# Patient Record
Sex: Female | Born: 1975 | Race: Black or African American | Hispanic: No | Marital: Single | Smoking: Never smoker
Health system: Southern US, Community
[De-identification: ages and names within clinical notes are randomized; demographics above are authoritative.]

## PROBLEM LIST (undated history)

## (undated) DIAGNOSIS — Z21 Asymptomatic human immunodeficiency virus [HIV] infection status: Secondary | ICD-10-CM

## (undated) DIAGNOSIS — I10 Essential (primary) hypertension: Secondary | ICD-10-CM

## (undated) DIAGNOSIS — R7303 Prediabetes: Secondary | ICD-10-CM

## (undated) DIAGNOSIS — O24419 Gestational diabetes mellitus in pregnancy, unspecified control: Secondary | ICD-10-CM

## (undated) DIAGNOSIS — B20 Human immunodeficiency virus [HIV] disease: Secondary | ICD-10-CM

## (undated) HISTORY — DX: Gestational diabetes mellitus in pregnancy, unspecified control: O24.419

## (undated) HISTORY — DX: Asymptomatic human immunodeficiency virus (hiv) infection status: Z21

## (undated) HISTORY — DX: Human immunodeficiency virus (HIV) disease: B20

## (undated) HISTORY — PX: NO PAST SURGERIES: SHX2092

---

## 2003-07-22 ENCOUNTER — Inpatient Hospital Stay (HOSPITAL_COMMUNITY): Admission: AD | Admit: 2003-07-22 | Discharge: 2003-07-22 | Payer: Self-pay | Admitting: Obstetrics & Gynecology

## 2003-07-26 ENCOUNTER — Inpatient Hospital Stay (HOSPITAL_COMMUNITY): Admission: AD | Admit: 2003-07-26 | Discharge: 2003-07-26 | Payer: Self-pay | Admitting: Obstetrics and Gynecology

## 2003-07-31 ENCOUNTER — Inpatient Hospital Stay (HOSPITAL_COMMUNITY): Admission: RE | Admit: 2003-07-31 | Discharge: 2003-07-31 | Payer: Self-pay | Admitting: Obstetrics & Gynecology

## 2003-11-22 ENCOUNTER — Encounter (INDEPENDENT_AMBULATORY_CARE_PROVIDER_SITE_OTHER): Payer: Self-pay | Admitting: *Deleted

## 2003-11-22 ENCOUNTER — Ambulatory Visit: Payer: Self-pay | Admitting: Infectious Diseases

## 2003-11-22 ENCOUNTER — Ambulatory Visit (HOSPITAL_COMMUNITY): Admission: RE | Admit: 2003-11-22 | Discharge: 2003-11-22 | Payer: Self-pay | Admitting: Infectious Diseases

## 2003-11-22 LAB — CONVERTED CEMR LAB
CD4 Count: 250 microliters
CD4 T Cell Abs: 250

## 2003-12-13 ENCOUNTER — Ambulatory Visit: Payer: Self-pay | Admitting: Infectious Diseases

## 2004-01-09 ENCOUNTER — Ambulatory Visit: Payer: Self-pay | Admitting: *Deleted

## 2004-01-17 ENCOUNTER — Ambulatory Visit: Payer: Self-pay | Admitting: *Deleted

## 2004-01-31 ENCOUNTER — Ambulatory Visit: Payer: Self-pay | Admitting: Obstetrics & Gynecology

## 2004-02-08 ENCOUNTER — Ambulatory Visit (HOSPITAL_COMMUNITY): Admission: RE | Admit: 2004-02-08 | Discharge: 2004-02-08 | Payer: Self-pay | Admitting: *Deleted

## 2004-02-22 ENCOUNTER — Ambulatory Visit: Payer: Self-pay | Admitting: Family Medicine

## 2004-02-29 ENCOUNTER — Ambulatory Visit: Payer: Self-pay | Admitting: Family Medicine

## 2004-03-02 ENCOUNTER — Inpatient Hospital Stay (HOSPITAL_COMMUNITY): Admission: AD | Admit: 2004-03-02 | Discharge: 2004-03-05 | Payer: Self-pay | Admitting: Obstetrics & Gynecology

## 2004-03-02 ENCOUNTER — Ambulatory Visit: Payer: Self-pay | Admitting: Obstetrics and Gynecology

## 2004-03-03 ENCOUNTER — Ambulatory Visit: Payer: Self-pay | Admitting: Obstetrics & Gynecology

## 2004-04-15 ENCOUNTER — Ambulatory Visit (HOSPITAL_COMMUNITY): Admission: RE | Admit: 2004-04-15 | Discharge: 2004-04-15 | Payer: Self-pay | Admitting: Infectious Diseases

## 2004-04-15 ENCOUNTER — Ambulatory Visit: Payer: Self-pay | Admitting: Infectious Diseases

## 2004-07-03 ENCOUNTER — Ambulatory Visit: Payer: Self-pay | Admitting: Infectious Diseases

## 2004-07-03 ENCOUNTER — Ambulatory Visit (HOSPITAL_COMMUNITY): Admission: RE | Admit: 2004-07-03 | Discharge: 2004-07-03 | Payer: Self-pay | Admitting: Infectious Diseases

## 2004-10-14 ENCOUNTER — Ambulatory Visit (HOSPITAL_COMMUNITY): Admission: RE | Admit: 2004-10-14 | Discharge: 2004-10-14 | Payer: Self-pay | Admitting: Infectious Diseases

## 2004-10-14 ENCOUNTER — Ambulatory Visit: Payer: Self-pay | Admitting: Infectious Diseases

## 2004-10-14 ENCOUNTER — Emergency Department (HOSPITAL_COMMUNITY): Admission: EM | Admit: 2004-10-14 | Discharge: 2004-10-14 | Payer: Self-pay | Admitting: Emergency Medicine

## 2004-12-05 ENCOUNTER — Ambulatory Visit: Payer: Self-pay | Admitting: Infectious Diseases

## 2005-03-24 ENCOUNTER — Encounter (INDEPENDENT_AMBULATORY_CARE_PROVIDER_SITE_OTHER): Payer: Self-pay | Admitting: *Deleted

## 2005-03-24 LAB — CONVERTED CEMR LAB: HIV 1 RNA Quant: 399 copies/mL

## 2005-03-26 ENCOUNTER — Ambulatory Visit: Payer: Self-pay | Admitting: Infectious Diseases

## 2005-03-26 ENCOUNTER — Encounter (INDEPENDENT_AMBULATORY_CARE_PROVIDER_SITE_OTHER): Payer: Self-pay | Admitting: *Deleted

## 2005-03-26 LAB — CONVERTED CEMR LAB: CD4 Count: 420 microliters

## 2005-04-01 ENCOUNTER — Encounter (INDEPENDENT_AMBULATORY_CARE_PROVIDER_SITE_OTHER): Payer: Self-pay | Admitting: *Deleted

## 2005-04-01 ENCOUNTER — Ambulatory Visit: Payer: Self-pay | Admitting: Family Medicine

## 2005-04-09 ENCOUNTER — Ambulatory Visit (HOSPITAL_COMMUNITY): Admission: RE | Admit: 2005-04-09 | Discharge: 2005-04-09 | Payer: Self-pay | Admitting: Obstetrics and Gynecology

## 2005-04-09 ENCOUNTER — Ambulatory Visit: Payer: Self-pay | Admitting: *Deleted

## 2005-04-28 ENCOUNTER — Ambulatory Visit: Payer: Self-pay | Admitting: Family Medicine

## 2005-05-12 ENCOUNTER — Ambulatory Visit: Payer: Self-pay | Admitting: Family Medicine

## 2005-05-12 ENCOUNTER — Ambulatory Visit: Payer: Self-pay | Admitting: Infectious Diseases

## 2005-05-26 ENCOUNTER — Ambulatory Visit: Payer: Self-pay | Admitting: *Deleted

## 2005-05-27 ENCOUNTER — Ambulatory Visit (HOSPITAL_COMMUNITY): Admission: RE | Admit: 2005-05-27 | Discharge: 2005-05-27 | Payer: Self-pay | Admitting: *Deleted

## 2005-06-09 ENCOUNTER — Ambulatory Visit: Payer: Self-pay | Admitting: Family Medicine

## 2005-06-23 ENCOUNTER — Ambulatory Visit: Payer: Self-pay | Admitting: Family Medicine

## 2005-07-03 ENCOUNTER — Ambulatory Visit: Payer: Self-pay | Admitting: Gynecology

## 2005-07-10 ENCOUNTER — Ambulatory Visit: Payer: Self-pay | Admitting: Gynecology

## 2005-07-21 ENCOUNTER — Ambulatory Visit: Payer: Self-pay | Admitting: Family Medicine

## 2005-07-21 ENCOUNTER — Ambulatory Visit: Payer: Self-pay | Admitting: Obstetrics & Gynecology

## 2005-07-21 ENCOUNTER — Ambulatory Visit: Payer: Self-pay | Admitting: Internal Medicine

## 2005-07-21 ENCOUNTER — Inpatient Hospital Stay (HOSPITAL_COMMUNITY): Admission: AD | Admit: 2005-07-21 | Discharge: 2005-07-24 | Payer: Self-pay | Admitting: Gynecology

## 2005-07-22 ENCOUNTER — Encounter (INDEPENDENT_AMBULATORY_CARE_PROVIDER_SITE_OTHER): Payer: Self-pay | Admitting: Specialist

## 2005-09-08 ENCOUNTER — Encounter (INDEPENDENT_AMBULATORY_CARE_PROVIDER_SITE_OTHER): Payer: Self-pay | Admitting: *Deleted

## 2005-09-08 ENCOUNTER — Ambulatory Visit: Payer: Self-pay | Admitting: Infectious Diseases

## 2005-09-08 ENCOUNTER — Encounter: Admission: RE | Admit: 2005-09-08 | Discharge: 2005-09-08 | Payer: Self-pay | Admitting: Infectious Diseases

## 2005-09-08 LAB — CONVERTED CEMR LAB
CD4 Count: 460 microliters
HIV 1 RNA Quant: 49 copies/mL

## 2005-09-11 ENCOUNTER — Ambulatory Visit: Payer: Self-pay | Admitting: Obstetrics & Gynecology

## 2005-09-11 ENCOUNTER — Encounter (INDEPENDENT_AMBULATORY_CARE_PROVIDER_SITE_OTHER): Payer: Self-pay | Admitting: *Deleted

## 2005-10-09 ENCOUNTER — Ambulatory Visit: Payer: Self-pay | Admitting: Obstetrics & Gynecology

## 2005-11-10 ENCOUNTER — Ambulatory Visit: Payer: Self-pay | Admitting: Infectious Diseases

## 2005-12-24 DIAGNOSIS — B20 Human immunodeficiency virus [HIV] disease: Secondary | ICD-10-CM | POA: Insufficient documentation

## 2005-12-24 DIAGNOSIS — Z8619 Personal history of other infectious and parasitic diseases: Secondary | ICD-10-CM | POA: Insufficient documentation

## 2006-01-30 ENCOUNTER — Other Ambulatory Visit: Admission: RE | Admit: 2006-01-30 | Discharge: 2006-01-30 | Payer: Self-pay | Admitting: Gynecology

## 2006-01-30 ENCOUNTER — Ambulatory Visit: Payer: Self-pay | Admitting: Gynecology

## 2006-01-30 ENCOUNTER — Encounter (INDEPENDENT_AMBULATORY_CARE_PROVIDER_SITE_OTHER): Payer: Self-pay | Admitting: *Deleted

## 2006-03-06 ENCOUNTER — Ambulatory Visit: Payer: Self-pay | Admitting: Family Medicine

## 2006-03-12 ENCOUNTER — Ambulatory Visit (HOSPITAL_COMMUNITY): Admission: RE | Admit: 2006-03-12 | Discharge: 2006-03-12 | Payer: Self-pay | Admitting: Family Medicine

## 2006-03-23 ENCOUNTER — Encounter (INDEPENDENT_AMBULATORY_CARE_PROVIDER_SITE_OTHER): Payer: Self-pay | Admitting: *Deleted

## 2006-03-23 ENCOUNTER — Encounter: Admission: RE | Admit: 2006-03-23 | Discharge: 2006-03-23 | Payer: Self-pay | Admitting: Infectious Diseases

## 2006-03-23 ENCOUNTER — Ambulatory Visit: Payer: Self-pay | Admitting: Infectious Diseases

## 2006-03-23 LAB — CONVERTED CEMR LAB
ALT: 16 units/L (ref 0–35)
AST: 14 units/L (ref 0–37)
Albumin: 3.8 g/dL (ref 3.5–5.2)
Alkaline Phosphatase: 57 units/L (ref 39–117)
BUN: 9 mg/dL (ref 6–23)
Basophils Absolute: 0 10*3/uL (ref 0.0–0.1)
Basophils Relative: 0 % (ref 0–1)
Bilirubin Urine: NEGATIVE
CD4 Count: 300 microliters
CO2: 23 meq/L (ref 19–32)
Calcium: 8.9 mg/dL (ref 8.4–10.5)
Chloride: 102 meq/L (ref 96–112)
Cholesterol: 229 mg/dL — ABNORMAL HIGH (ref 0–200)
Creatinine, Ser: 0.61 mg/dL (ref 0.40–1.20)
Eosinophils Absolute: 0.1 10*3/uL (ref 0.0–0.7)
Eosinophils Relative: 2 % (ref 0–5)
Glucose, Bld: 110 mg/dL — ABNORMAL HIGH (ref 70–99)
HCT: 40 % (ref 36.0–46.0)
HDL: 44 mg/dL (ref >39)
HIV 1 RNA Quant: 49 copies/mL
HIV 1 RNA Quant: <50 copies/mL (ref <50)
HIV-1 RNA Quant, Log: <1.7 (ref <1.70)
Hemoglobin, Urine: NEGATIVE
Hemoglobin: 12.6 g/dL (ref 12.0–15.0)
Ketones, ur: NEGATIVE mg/dL
LDL Cholesterol: 157 mg/dL — ABNORMAL HIGH (ref 0–99)
Leukocytes, UA: NEGATIVE
Lymphocytes Relative: 22 % (ref 12–46)
Lymphs Abs: 1.1 10*3/uL (ref 0.7–3.3)
MCHC: 31.5 g/dL (ref 30.0–36.0)
MCV: 96.4 fL (ref 78.0–100.0)
Monocytes Absolute: 0.6 10*3/uL (ref 0.2–0.7)
Monocytes Relative: 11 % (ref 3–11)
Neutro Abs: 3.3 10*3/uL (ref 1.7–7.7)
Neutrophils Relative %: 65 % (ref 43–77)
Nitrite: NEGATIVE
Platelets: 184 10*3/uL (ref 150–400)
Potassium: 3.9 meq/L (ref 3.5–5.3)
Protein, ur: NEGATIVE mg/dL
RBC: 4.15 M/uL (ref 3.87–5.11)
RDW: 13 % (ref 11.5–14.0)
Sodium: 139 meq/L (ref 135–145)
Specific Gravity, Urine: 1.027 (ref 1.005–1.03)
Total Bilirubin: 0.3 mg/dL (ref 0.3–1.2)
Total CHOL/HDL Ratio: 5.2
Total Protein: 7.4 g/dL (ref 6.0–8.3)
Triglycerides: 141 mg/dL (ref <150)
Urine Glucose: NEGATIVE mg/dL
Urobilinogen, UA: 1 (ref 0.0–1.0)
VLDL: 28 mg/dL (ref 0–40)
WBC: 5 10*3/uL (ref 4.0–10.5)
pH: 6.5 (ref 5.0–8.0)

## 2006-04-08 ENCOUNTER — Ambulatory Visit: Payer: Self-pay | Admitting: Obstetrics and Gynecology

## 2006-04-13 ENCOUNTER — Encounter (INDEPENDENT_AMBULATORY_CARE_PROVIDER_SITE_OTHER): Payer: Self-pay | Admitting: *Deleted

## 2006-04-13 LAB — CONVERTED CEMR LAB: Pap Smear: ABNORMAL

## 2006-04-22 ENCOUNTER — Ambulatory Visit: Payer: Self-pay | Admitting: Infectious Diseases

## 2006-04-22 DIAGNOSIS — I10 Essential (primary) hypertension: Secondary | ICD-10-CM

## 2006-04-26 ENCOUNTER — Encounter (INDEPENDENT_AMBULATORY_CARE_PROVIDER_SITE_OTHER): Payer: Self-pay | Admitting: *Deleted

## 2006-05-01 ENCOUNTER — Telehealth (INDEPENDENT_AMBULATORY_CARE_PROVIDER_SITE_OTHER): Payer: Self-pay | Admitting: Infectious Diseases

## 2006-05-18 ENCOUNTER — Ambulatory Visit (HOSPITAL_COMMUNITY): Admission: RE | Admit: 2006-05-18 | Discharge: 2006-05-18 | Payer: Self-pay | Admitting: Obstetrics and Gynecology

## 2006-05-18 ENCOUNTER — Encounter (INDEPENDENT_AMBULATORY_CARE_PROVIDER_SITE_OTHER): Payer: Self-pay | Admitting: *Deleted

## 2006-05-18 ENCOUNTER — Ambulatory Visit: Payer: Self-pay | Admitting: Obstetrics and Gynecology

## 2006-05-25 ENCOUNTER — Ambulatory Visit: Payer: Self-pay | Admitting: Obstetrics & Gynecology

## 2006-05-25 ENCOUNTER — Ambulatory Visit (HOSPITAL_COMMUNITY): Admission: AD | Admit: 2006-05-25 | Discharge: 2006-05-26 | Payer: Self-pay | Admitting: Obstetrics & Gynecology

## 2006-06-01 ENCOUNTER — Telehealth (INDEPENDENT_AMBULATORY_CARE_PROVIDER_SITE_OTHER): Payer: Self-pay | Admitting: Infectious Diseases

## 2006-06-03 ENCOUNTER — Ambulatory Visit: Payer: Self-pay | Admitting: Gynecology

## 2006-06-26 ENCOUNTER — Telehealth (INDEPENDENT_AMBULATORY_CARE_PROVIDER_SITE_OTHER): Payer: Self-pay | Admitting: Infectious Diseases

## 2006-07-08 ENCOUNTER — Ambulatory Visit: Payer: Self-pay | Admitting: Obstetrics and Gynecology

## 2006-07-15 ENCOUNTER — Telehealth (INDEPENDENT_AMBULATORY_CARE_PROVIDER_SITE_OTHER): Payer: Self-pay | Admitting: Infectious Diseases

## 2006-07-28 ENCOUNTER — Telehealth (INDEPENDENT_AMBULATORY_CARE_PROVIDER_SITE_OTHER): Payer: Self-pay | Admitting: Infectious Diseases

## 2006-08-25 ENCOUNTER — Encounter (INDEPENDENT_AMBULATORY_CARE_PROVIDER_SITE_OTHER): Payer: Self-pay | Admitting: *Deleted

## 2006-08-28 ENCOUNTER — Telehealth: Payer: Self-pay | Admitting: Internal Medicine

## 2006-09-17 ENCOUNTER — Ambulatory Visit: Payer: Self-pay | Admitting: Gynecology

## 2006-09-17 ENCOUNTER — Encounter (INDEPENDENT_AMBULATORY_CARE_PROVIDER_SITE_OTHER): Payer: Self-pay | Admitting: Gynecology

## 2006-09-17 ENCOUNTER — Encounter: Payer: Self-pay | Admitting: Internal Medicine

## 2006-09-18 ENCOUNTER — Ambulatory Visit: Payer: Self-pay | Admitting: Internal Medicine

## 2006-09-18 ENCOUNTER — Encounter: Admission: RE | Admit: 2006-09-18 | Discharge: 2006-09-18 | Payer: Self-pay | Admitting: Internal Medicine

## 2006-09-18 LAB — CONVERTED CEMR LAB
ALT: 16 units/L (ref 0–35)
AST: 17 units/L (ref 0–37)
Albumin: 4 g/dL (ref 3.5–5.2)
Alkaline Phosphatase: 62 units/L (ref 39–117)
BUN: 8 mg/dL (ref 6–23)
Basophils Absolute: 0 10*3/uL (ref 0.0–0.1)
Basophils Relative: 1 % (ref 0–1)
CO2: 26 meq/L (ref 19–32)
Calcium: 8.6 mg/dL (ref 8.4–10.5)
Chloride: 103 meq/L (ref 96–112)
Creatinine, Ser: 0.63 mg/dL (ref 0.40–1.20)
Eosinophils Absolute: 0.1 10*3/uL (ref 0.0–0.7)
Eosinophils Relative: 1 % (ref 0–5)
Glucose, Bld: 104 mg/dL — ABNORMAL HIGH (ref 70–99)
HCT: 36.7 % (ref 36.0–46.0)
HIV 1 RNA Quant: <50 copies/mL (ref <50)
HIV-1 RNA Quant, Log: <1.7 (ref <1.70)
Hemoglobin: 11.2 g/dL — ABNORMAL LOW (ref 12.0–15.0)
Lymphocytes Relative: 35 % (ref 12–46)
Lymphs Abs: 1.9 10*3/uL (ref 0.7–3.3)
MCHC: 30.5 g/dL (ref 30.0–36.0)
MCV: 87 fL (ref 78.0–100.0)
Monocytes Absolute: 0.5 10*3/uL (ref 0.2–0.7)
Monocytes Relative: 10 % (ref 3–11)
Neutro Abs: 3 10*3/uL (ref 1.7–7.7)
Neutrophils Relative %: 54 % (ref 43–77)
Platelets: 252 10*3/uL (ref 150–400)
Potassium: 3.6 meq/L (ref 3.5–5.3)
RBC: 4.22 M/uL (ref 3.87–5.11)
RDW: 15 % — ABNORMAL HIGH (ref 11.5–14.0)
Sodium: 138 meq/L (ref 135–145)
Total Bilirubin: 0.2 mg/dL — ABNORMAL LOW (ref 0.3–1.2)
Total Protein: 7.4 g/dL (ref 6.0–8.3)
WBC: 5.5 10*3/uL (ref 4.0–10.5)

## 2006-09-29 ENCOUNTER — Encounter (INDEPENDENT_AMBULATORY_CARE_PROVIDER_SITE_OTHER): Payer: Self-pay | Admitting: *Deleted

## 2006-09-29 ENCOUNTER — Telehealth: Payer: Self-pay | Admitting: Internal Medicine

## 2006-09-29 LAB — CONVERTED CEMR LAB: Pap Smear: NORMAL

## 2006-10-09 ENCOUNTER — Encounter (INDEPENDENT_AMBULATORY_CARE_PROVIDER_SITE_OTHER): Payer: Self-pay | Admitting: *Deleted

## 2006-10-09 ENCOUNTER — Ambulatory Visit: Payer: Self-pay | Admitting: Internal Medicine

## 2006-10-13 ENCOUNTER — Telehealth: Payer: Self-pay | Admitting: Internal Medicine

## 2006-10-21 ENCOUNTER — Telehealth: Payer: Self-pay | Admitting: Internal Medicine

## 2006-10-27 ENCOUNTER — Telehealth: Payer: Self-pay | Admitting: Internal Medicine

## 2006-11-25 ENCOUNTER — Telehealth: Payer: Self-pay | Admitting: Internal Medicine

## 2006-12-28 ENCOUNTER — Telehealth: Payer: Self-pay | Admitting: Internal Medicine

## 2007-01-22 ENCOUNTER — Encounter: Admission: RE | Admit: 2007-01-22 | Discharge: 2007-01-22 | Payer: Self-pay | Admitting: Internal Medicine

## 2007-01-22 ENCOUNTER — Ambulatory Visit: Payer: Self-pay | Admitting: Internal Medicine

## 2007-01-22 LAB — CONVERTED CEMR LAB
ALT: 19 units/L (ref 0–35)
AST: 20 units/L (ref 0–37)
Albumin: 4 g/dL (ref 3.5–5.2)
Alkaline Phosphatase: 48 units/L (ref 39–117)
BUN: 13 mg/dL (ref 6–23)
Basophils Absolute: 0 10*3/uL (ref 0.0–0.1)
Basophils Relative: 0 % (ref 0–1)
CO2: 24 meq/L (ref 19–32)
Calcium: 9.2 mg/dL (ref 8.4–10.5)
Chloride: 103 meq/L (ref 96–112)
Creatinine, Ser: 0.66 mg/dL (ref 0.40–1.20)
Eosinophils Absolute: 0 10*3/uL — ABNORMAL LOW (ref 0.2–0.7)
Eosinophils Relative: 1 % (ref 0–5)
Glucose, Bld: 118 mg/dL — ABNORMAL HIGH (ref 70–99)
HCT: 36.5 % (ref 36.0–46.0)
HIV 1 RNA Quant: <50 copies/mL (ref <50)
Hemoglobin: 11.3 g/dL — ABNORMAL LOW (ref 12.0–15.0)
Lymphocytes Relative: 30 % (ref 12–46)
Lymphs Abs: 1.6 10*3/uL (ref 0.7–4.0)
MCHC: 31 g/dL (ref 30.0–36.0)
MCV: 90.8 fL (ref 78.0–100.0)
Monocytes Absolute: 0.9 10*3/uL (ref 0.1–1.0)
Monocytes Relative: 16 % — ABNORMAL HIGH (ref 3–12)
Neutro Abs: 2.8 10*3/uL (ref 1.7–7.7)
Neutrophils Relative %: 53 % (ref 43–77)
Platelets: 206 10*3/uL (ref 150–400)
Potassium: 3.6 meq/L (ref 3.5–5.3)
RBC: 4.02 M/uL (ref 3.87–5.11)
RDW: 14.4 % (ref 11.5–15.5)
Sodium: 139 meq/L (ref 135–145)
Total Bilirubin: 0.3 mg/dL (ref 0.3–1.2)
Total Protein: 7.8 g/dL (ref 6.0–8.3)
WBC: 5.3 10*3/uL (ref 4.0–10.5)

## 2007-01-27 ENCOUNTER — Telehealth: Payer: Self-pay | Admitting: Infectious Disease

## 2007-02-05 ENCOUNTER — Ambulatory Visit: Payer: Self-pay | Admitting: Internal Medicine

## 2007-02-16 ENCOUNTER — Encounter: Payer: Self-pay | Admitting: Infectious Disease

## 2007-02-16 ENCOUNTER — Encounter (INDEPENDENT_AMBULATORY_CARE_PROVIDER_SITE_OTHER): Payer: Self-pay | Admitting: *Deleted

## 2007-02-26 ENCOUNTER — Telehealth: Payer: Self-pay | Admitting: Internal Medicine

## 2007-03-30 ENCOUNTER — Telehealth: Payer: Self-pay | Admitting: Internal Medicine

## 2007-05-04 ENCOUNTER — Telehealth: Payer: Self-pay | Admitting: Internal Medicine

## 2007-05-13 ENCOUNTER — Encounter (INDEPENDENT_AMBULATORY_CARE_PROVIDER_SITE_OTHER): Payer: Self-pay | Admitting: *Deleted

## 2007-05-19 ENCOUNTER — Encounter: Admission: RE | Admit: 2007-05-19 | Discharge: 2007-05-19 | Payer: Self-pay | Admitting: Internal Medicine

## 2007-05-19 ENCOUNTER — Ambulatory Visit: Payer: Self-pay | Admitting: Internal Medicine

## 2007-05-19 LAB — CONVERTED CEMR LAB
ALT: 26 units/L (ref 0–35)
AST: 18 units/L (ref 0–37)
Albumin: 4.5 g/dL (ref 3.5–5.2)
BUN: 10 mg/dL (ref 6–23)
Basophils Absolute: 0 10*3/uL (ref 0.0–0.1)
Basophils Relative: 1 % (ref 0–1)
Calcium: 9.7 mg/dL (ref 8.4–10.5)
Chloride: 99 meq/L (ref 96–112)
MCHC: 33 g/dL (ref 30.0–36.0)
Monocytes Relative: 13 % — ABNORMAL HIGH (ref 3–12)
Neutro Abs: 3.8 10*3/uL (ref 1.7–7.7)
Neutrophils Relative %: 58 % (ref 43–77)
Potassium: 4.2 meq/L (ref 3.5–5.3)
RBC: 4.32 M/uL (ref 3.87–5.11)
Total Protein: 8.3 g/dL (ref 6.0–8.3)
WBC: 6.6 10*3/uL (ref 4.0–10.5)

## 2007-06-01 ENCOUNTER — Telehealth (INDEPENDENT_AMBULATORY_CARE_PROVIDER_SITE_OTHER): Payer: Self-pay | Admitting: *Deleted

## 2007-06-02 ENCOUNTER — Ambulatory Visit: Payer: Self-pay | Admitting: Internal Medicine

## 2007-06-02 ENCOUNTER — Encounter (INDEPENDENT_AMBULATORY_CARE_PROVIDER_SITE_OTHER): Payer: Self-pay | Admitting: *Deleted

## 2007-07-02 ENCOUNTER — Telehealth (INDEPENDENT_AMBULATORY_CARE_PROVIDER_SITE_OTHER): Payer: Self-pay | Admitting: *Deleted

## 2007-07-30 ENCOUNTER — Telehealth (INDEPENDENT_AMBULATORY_CARE_PROVIDER_SITE_OTHER): Payer: Self-pay | Admitting: *Deleted

## 2007-08-27 ENCOUNTER — Telehealth (INDEPENDENT_AMBULATORY_CARE_PROVIDER_SITE_OTHER): Payer: Self-pay | Admitting: *Deleted

## 2007-08-31 ENCOUNTER — Encounter: Admission: RE | Admit: 2007-08-31 | Discharge: 2007-08-31 | Payer: Self-pay | Admitting: Internal Medicine

## 2007-08-31 ENCOUNTER — Ambulatory Visit: Payer: Self-pay | Admitting: Internal Medicine

## 2007-08-31 LAB — CONVERTED CEMR LAB
AST: 24 units/L (ref 0–37)
Albumin: 4.5 g/dL (ref 3.5–5.2)
Alkaline Phosphatase: 51 units/L (ref 39–117)
Basophils Relative: 1 % (ref 0–1)
Eosinophils Absolute: 0.1 10*3/uL (ref 0.0–0.7)
MCHC: 32.7 g/dL (ref 30.0–36.0)
MCV: 91.3 fL (ref 78.0–100.0)
Neutrophils Relative %: 48 % (ref 43–77)
Platelets: 195 10*3/uL (ref 150–400)
Potassium: 3.4 meq/L — ABNORMAL LOW (ref 3.5–5.3)
RDW: 13.6 % (ref 11.5–15.5)
Sodium: 137 meq/L (ref 135–145)
Total Bilirubin: 0.4 mg/dL (ref 0.3–1.2)
Total Protein: 8.1 g/dL (ref 6.0–8.3)

## 2007-09-15 ENCOUNTER — Ambulatory Visit: Payer: Self-pay | Admitting: Internal Medicine

## 2007-09-24 ENCOUNTER — Telehealth (INDEPENDENT_AMBULATORY_CARE_PROVIDER_SITE_OTHER): Payer: Self-pay | Admitting: *Deleted

## 2007-10-21 ENCOUNTER — Telehealth (INDEPENDENT_AMBULATORY_CARE_PROVIDER_SITE_OTHER): Payer: Self-pay | Admitting: *Deleted

## 2007-11-22 ENCOUNTER — Telehealth (INDEPENDENT_AMBULATORY_CARE_PROVIDER_SITE_OTHER): Payer: Self-pay | Admitting: *Deleted

## 2007-12-14 ENCOUNTER — Ambulatory Visit: Payer: Self-pay | Admitting: Internal Medicine

## 2007-12-14 ENCOUNTER — Encounter (INDEPENDENT_AMBULATORY_CARE_PROVIDER_SITE_OTHER): Payer: Self-pay | Admitting: Internal Medicine

## 2007-12-14 LAB — CONVERTED CEMR LAB
Alkaline Phosphatase: 55 units/L (ref 39–117)
BUN: 10 mg/dL (ref 6–23)
CO2: 26 meq/L (ref 19–32)
Creatinine, Ser: 0.68 mg/dL (ref 0.40–1.20)
Eosinophils Absolute: 0 10*3/uL (ref 0.0–0.7)
Eosinophils Relative: 0 % (ref 0–5)
Glucose, Bld: 122 mg/dL — ABNORMAL HIGH (ref 70–99)
HCT: 38.5 % (ref 36.0–46.0)
HIV 1 RNA Quant: <50 copies/mL (ref <50)
HIV-1 RNA Quant, Log: <1.7 (ref <1.70)
Hemoglobin: 12.5 g/dL (ref 12.0–15.0)
Lymphs Abs: 2.4 10*3/uL (ref 0.7–4.0)
MCV: 93.4 fL (ref 78.0–100.0)
Monocytes Absolute: 0.6 10*3/uL (ref 0.1–1.0)
Platelets: 210 10*3/uL (ref 150–400)
RDW: 12.7 % (ref 11.5–15.5)
Sodium: 138 meq/L (ref 135–145)
Total Bilirubin: 0.3 mg/dL (ref 0.3–1.2)
Total Protein: 7.4 g/dL (ref 6.0–8.3)
WBC: 6.8 10*3/uL (ref 4.0–10.5)

## 2007-12-17 ENCOUNTER — Telehealth (INDEPENDENT_AMBULATORY_CARE_PROVIDER_SITE_OTHER): Payer: Self-pay | Admitting: *Deleted

## 2007-12-31 ENCOUNTER — Ambulatory Visit: Payer: Self-pay | Admitting: Internal Medicine

## 2007-12-31 ENCOUNTER — Encounter: Payer: Self-pay | Admitting: Internal Medicine

## 2008-01-07 ENCOUNTER — Encounter: Payer: Self-pay | Admitting: Internal Medicine

## 2008-01-20 ENCOUNTER — Telehealth (INDEPENDENT_AMBULATORY_CARE_PROVIDER_SITE_OTHER): Payer: Self-pay | Admitting: *Deleted

## 2008-02-16 ENCOUNTER — Telehealth (INDEPENDENT_AMBULATORY_CARE_PROVIDER_SITE_OTHER): Payer: Self-pay | Admitting: *Deleted

## 2008-03-22 ENCOUNTER — Telehealth (INDEPENDENT_AMBULATORY_CARE_PROVIDER_SITE_OTHER): Payer: Self-pay | Admitting: *Deleted

## 2008-03-27 ENCOUNTER — Ambulatory Visit: Payer: Self-pay | Admitting: Internal Medicine

## 2008-03-27 LAB — CONVERTED CEMR LAB
AST: 12 units/L (ref 0–37)
Albumin: 4.3 g/dL (ref 3.5–5.2)
BUN: 10 mg/dL (ref 6–23)
Basophils Relative: 0 % (ref 0–1)
CO2: 27 meq/L (ref 19–32)
Calcium: 9.8 mg/dL (ref 8.4–10.5)
Chloride: 101 meq/L (ref 96–112)
Creatinine, Ser: 0.68 mg/dL (ref 0.40–1.20)
HIV 1 RNA Quant: 93 copies/mL — ABNORMAL HIGH (ref <48)
Hemoglobin: 13.4 g/dL (ref 12.0–15.0)
Lymphocytes Relative: 40 % (ref 12–46)
Lymphs Abs: 2.1 10*3/uL (ref 0.7–4.0)
Monocytes Absolute: 0.6 10*3/uL (ref 0.1–1.0)
Monocytes Relative: 11 % (ref 3–12)
Neutro Abs: 2.6 10*3/uL (ref 1.7–7.7)
Neutrophils Relative %: 49 % (ref 43–77)
Potassium: 3.7 meq/L (ref 3.5–5.3)
RBC: 4.26 M/uL (ref 3.87–5.11)
WBC: 5.3 10*3/uL (ref 4.0–10.5)

## 2008-04-14 ENCOUNTER — Ambulatory Visit: Payer: Self-pay | Admitting: Internal Medicine

## 2008-04-19 ENCOUNTER — Telehealth (INDEPENDENT_AMBULATORY_CARE_PROVIDER_SITE_OTHER): Payer: Self-pay | Admitting: *Deleted

## 2008-05-17 ENCOUNTER — Telehealth (INDEPENDENT_AMBULATORY_CARE_PROVIDER_SITE_OTHER): Payer: Self-pay | Admitting: *Deleted

## 2008-06-07 ENCOUNTER — Ambulatory Visit: Payer: Self-pay | Admitting: Internal Medicine

## 2008-06-07 LAB — CONVERTED CEMR LAB
AST: 29 units/L (ref 0–37)
Alkaline Phosphatase: 55 units/L (ref 39–117)
BUN: 12 mg/dL (ref 6–23)
Eosinophils Absolute: 0.1 10*3/uL (ref 0.0–0.7)
Eosinophils Relative: 1 % (ref 0–5)
GFR calc non Af Amer: >60 mL/min (ref >60)
Glucose, Bld: 127 mg/dL — ABNORMAL HIGH (ref 70–99)
HCT: 39 % (ref 36.0–46.0)
HIV-1 RNA Quant, Log: <1.68 (ref <1.68)
Hemoglobin: 12.9 g/dL (ref 12.0–15.0)
Lymphocytes Relative: 36 % (ref 12–46)
Lymphs Abs: 2.1 10*3/uL (ref 0.7–4.0)
MCV: 92.6 fL (ref 78.0–100.0)
Monocytes Absolute: 0.6 10*3/uL (ref 0.1–1.0)
Potassium: 3.8 meq/L (ref 3.5–5.3)
Sodium: 137 meq/L (ref 135–145)
Total Bilirubin: 0.2 mg/dL — ABNORMAL LOW (ref 0.3–1.2)
Total Protein: 7.5 g/dL (ref 6.0–8.3)
WBC: 5.9 10*3/uL (ref 4.0–10.5)

## 2008-06-09 ENCOUNTER — Encounter (INDEPENDENT_AMBULATORY_CARE_PROVIDER_SITE_OTHER): Payer: Self-pay | Admitting: *Deleted

## 2008-06-15 ENCOUNTER — Telehealth (INDEPENDENT_AMBULATORY_CARE_PROVIDER_SITE_OTHER): Payer: Self-pay | Admitting: *Deleted

## 2008-06-23 ENCOUNTER — Ambulatory Visit: Payer: Self-pay | Admitting: Internal Medicine

## 2008-07-13 ENCOUNTER — Telehealth (INDEPENDENT_AMBULATORY_CARE_PROVIDER_SITE_OTHER): Payer: Self-pay | Admitting: *Deleted

## 2008-08-15 ENCOUNTER — Telehealth (INDEPENDENT_AMBULATORY_CARE_PROVIDER_SITE_OTHER): Payer: Self-pay | Admitting: *Deleted

## 2008-09-11 ENCOUNTER — Telehealth (INDEPENDENT_AMBULATORY_CARE_PROVIDER_SITE_OTHER): Payer: Self-pay | Admitting: *Deleted

## 2008-09-21 ENCOUNTER — Ambulatory Visit: Payer: Self-pay | Admitting: Internal Medicine

## 2008-09-21 LAB — CONVERTED CEMR LAB
ALT: 14 units/L (ref 0–35)
Albumin: 4.1 g/dL (ref 3.5–5.2)
Basophils Absolute: 0 10*3/uL (ref 0.0–0.1)
Basophils Relative: 0 % (ref 0–1)
CO2: 24 meq/L (ref 19–32)
Calcium: 9.1 mg/dL (ref 8.4–10.5)
Chloride: 105 meq/L (ref 96–112)
Eosinophils Relative: 1 % (ref 0–5)
Glucose, Bld: 113 mg/dL — ABNORMAL HIGH (ref 70–99)
LDL Cholesterol: 168 mg/dL — ABNORMAL HIGH (ref 0–99)
Lymphocytes Relative: 39 % (ref 12–46)
MCHC: 32.2 g/dL (ref 30.0–36.0)
Monocytes Absolute: 0.7 10*3/uL (ref 0.1–1.0)
Monocytes Relative: 12 % (ref 3–12)
Potassium: 4.1 meq/L (ref 3.5–5.3)
RDW: 12.8 % (ref 11.5–15.5)
Sodium: 137 meq/L (ref 135–145)
Total Bilirubin: 0.3 mg/dL (ref 0.3–1.2)
Total CHOL/HDL Ratio: 5.2
Total Protein: 7.2 g/dL (ref 6.0–8.3)
Triglycerides: 160 mg/dL — ABNORMAL HIGH (ref <150)
VLDL: 32 mg/dL (ref 0–40)
WBC: 5.7 10*3/uL (ref 4.0–10.5)

## 2008-10-06 ENCOUNTER — Ambulatory Visit: Payer: Self-pay | Admitting: Internal Medicine

## 2008-10-06 DIAGNOSIS — E785 Hyperlipidemia, unspecified: Secondary | ICD-10-CM | POA: Insufficient documentation

## 2008-10-09 ENCOUNTER — Telehealth (INDEPENDENT_AMBULATORY_CARE_PROVIDER_SITE_OTHER): Payer: Self-pay | Admitting: *Deleted

## 2008-10-24 ENCOUNTER — Encounter (INDEPENDENT_AMBULATORY_CARE_PROVIDER_SITE_OTHER): Payer: Self-pay | Admitting: *Deleted

## 2008-11-03 ENCOUNTER — Telehealth (INDEPENDENT_AMBULATORY_CARE_PROVIDER_SITE_OTHER): Payer: Self-pay | Admitting: *Deleted

## 2008-12-06 ENCOUNTER — Telehealth (INDEPENDENT_AMBULATORY_CARE_PROVIDER_SITE_OTHER): Payer: Self-pay | Admitting: *Deleted

## 2008-12-22 ENCOUNTER — Ambulatory Visit: Payer: Self-pay | Admitting: Obstetrics & Gynecology

## 2009-01-03 ENCOUNTER — Ambulatory Visit: Payer: Self-pay | Admitting: Internal Medicine

## 2009-01-03 ENCOUNTER — Telehealth (INDEPENDENT_AMBULATORY_CARE_PROVIDER_SITE_OTHER): Payer: Self-pay | Admitting: *Deleted

## 2009-01-03 LAB — CONVERTED CEMR LAB
ALT: 15 units/L (ref 0–35)
AST: 14 units/L (ref 0–37)
Alkaline Phosphatase: 45 units/L (ref 39–117)
BUN: 12 mg/dL (ref 6–23)
Basophils Absolute: 0 10*3/uL (ref 0.0–0.1)
Basophils Relative: 0 % (ref 0–1)
Calcium: 8.7 mg/dL (ref 8.4–10.5)
Chloride: 106 meq/L (ref 96–112)
Creatinine, Ser: 0.67 mg/dL (ref 0.40–1.20)
Eosinophils Absolute: 0 10*3/uL (ref 0.0–0.7)
Eosinophils Relative: 1 % (ref 0–5)
HCT: 38.8 % (ref 36.0–46.0)
Lymphs Abs: 1.9 10*3/uL (ref 0.7–4.0)
MCV: 95.8 fL (ref >=78.0)
Neutrophils Relative %: 55 % (ref 43–77)
Platelets: 201 10*3/uL (ref 150–400)
RDW: 13.1 % (ref 11.5–15.5)
Total Bilirubin: 0.2 mg/dL — ABNORMAL LOW (ref 0.3–1.2)
WBC: 5.7 10*3/uL (ref 4.0–10.5)

## 2009-01-19 ENCOUNTER — Ambulatory Visit: Payer: Self-pay | Admitting: Internal Medicine

## 2009-02-02 ENCOUNTER — Telehealth (INDEPENDENT_AMBULATORY_CARE_PROVIDER_SITE_OTHER): Payer: Self-pay | Admitting: *Deleted

## 2009-03-01 ENCOUNTER — Telehealth (INDEPENDENT_AMBULATORY_CARE_PROVIDER_SITE_OTHER): Payer: Self-pay | Admitting: *Deleted

## 2009-03-30 ENCOUNTER — Encounter (INDEPENDENT_AMBULATORY_CARE_PROVIDER_SITE_OTHER): Payer: Self-pay | Admitting: *Deleted

## 2009-03-31 ENCOUNTER — Telehealth (INDEPENDENT_AMBULATORY_CARE_PROVIDER_SITE_OTHER): Payer: Self-pay | Admitting: *Deleted

## 2009-04-23 ENCOUNTER — Ambulatory Visit: Payer: Self-pay | Admitting: Internal Medicine

## 2009-04-23 LAB — CONVERTED CEMR LAB
ALT: 17 units/L (ref 0–35)
Basophils Relative: 0 % (ref 0–1)
CO2: 25 meq/L (ref 19–32)
Calcium: 8.8 mg/dL (ref 8.4–10.5)
Chloride: 106 meq/L (ref 96–112)
Creatinine, Ser: 0.64 mg/dL (ref 0.40–1.20)
Eosinophils Absolute: 0 10*3/uL (ref 0.0–0.7)
Glucose, Bld: 109 mg/dL — ABNORMAL HIGH (ref 70–99)
HCT: 38.3 % (ref 36.0–46.0)
HIV 1 RNA Quant: 52 copies/mL — ABNORMAL HIGH (ref <48)
Hemoglobin: 12.7 g/dL (ref 12.0–15.0)
Lymphs Abs: 2.3 10*3/uL (ref 0.7–4.0)
MCHC: 33.2 g/dL (ref 30.0–36.0)
MCV: 92.7 fL (ref >=78.0)
Monocytes Absolute: 0.6 10*3/uL (ref 0.1–1.0)
Monocytes Relative: 11 % (ref 3–12)
Neutrophils Relative %: 46 % (ref 43–77)
RBC: 4.13 M/uL (ref 3.87–5.11)
Sodium: 139 meq/L (ref 135–145)
Total Protein: 7.3 g/dL (ref 6.0–8.3)
WBC: 5.4 10*3/uL (ref 4.0–10.5)

## 2009-04-30 ENCOUNTER — Telehealth (INDEPENDENT_AMBULATORY_CARE_PROVIDER_SITE_OTHER): Payer: Self-pay | Admitting: *Deleted

## 2009-05-23 ENCOUNTER — Ambulatory Visit: Payer: Self-pay | Admitting: Internal Medicine

## 2009-05-25 ENCOUNTER — Telehealth (INDEPENDENT_AMBULATORY_CARE_PROVIDER_SITE_OTHER): Payer: Self-pay | Admitting: *Deleted

## 2009-05-28 ENCOUNTER — Encounter (INDEPENDENT_AMBULATORY_CARE_PROVIDER_SITE_OTHER): Payer: Self-pay | Admitting: *Deleted

## 2009-06-22 ENCOUNTER — Telehealth: Payer: Self-pay | Admitting: Internal Medicine

## 2009-07-04 ENCOUNTER — Ambulatory Visit: Payer: Self-pay | Admitting: Internal Medicine

## 2009-07-04 LAB — CONVERTED CEMR LAB
ALT: 9 units/L (ref 0–35)
AST: 10 units/L (ref 0–37)
Alkaline Phosphatase: 34 units/L — ABNORMAL LOW (ref 39–117)
Basophils Absolute: 0 10*3/uL (ref 0.0–0.1)
Basophils Relative: 1 % (ref 0–1)
Creatinine, Ser: 0.72 mg/dL (ref 0.40–1.20)
Eosinophils Absolute: 0 10*3/uL (ref 0.0–0.7)
HIV 1 RNA Quant: <48 copies/mL (ref <48)
MCHC: 32.4 g/dL (ref 30.0–36.0)
MCV: 94.4 fL (ref 78.0–100.0)
Neutro Abs: 2.9 10*3/uL (ref 1.7–7.7)
Neutrophils Relative %: 49 % (ref 43–77)
Platelets: 188 10*3/uL (ref 150–400)
Total Bilirubin: 0.4 mg/dL (ref 0.3–1.2)

## 2009-07-23 ENCOUNTER — Ambulatory Visit: Payer: Self-pay | Admitting: Internal Medicine

## 2009-07-24 ENCOUNTER — Telehealth: Payer: Self-pay | Admitting: Internal Medicine

## 2009-08-15 ENCOUNTER — Ambulatory Visit: Payer: Self-pay | Admitting: Obstetrics and Gynecology

## 2009-08-15 LAB — CONVERTED CEMR LAB
Alkaline Phosphatase: 36 units/L — ABNORMAL LOW (ref 39–117)
BUN: 9 mg/dL (ref 6–23)
Eosinophils Absolute: 0 10*3/uL (ref 0.0–0.7)
Glucose, Bld: 179 mg/dL — ABNORMAL HIGH (ref 70–99)
HCT: 38 % (ref 36.0–46.0)
Hemoglobin: 13 g/dL (ref 12.0–15.0)
Hepatitis B Surface Ag: NEGATIVE
Hgb A2 Quant: 2.9 % (ref 2.2–3.2)
Hgb A: 93.6 % — ABNORMAL LOW (ref 96.8–97.8)
Hgb S Quant: 0 % (ref 0.0–0.0)
Lymphs Abs: 2 10*3/uL (ref 0.7–4.0)
MCV: 93.8 fL (ref 78.0–100.0)
Monocytes Relative: 7 % (ref 3–12)
Neutrophils Relative %: 63 % (ref 43–77)
RBC: 4.05 M/uL (ref 3.87–5.11)
Rubella: 50.1 intl units/mL — ABNORMAL HIGH
Sodium: 132 meq/L — ABNORMAL LOW (ref 135–145)
Total Bilirubin: 0.3 mg/dL (ref 0.3–1.2)
WBC: 6.8 10*3/uL (ref 4.0–10.5)

## 2009-08-22 ENCOUNTER — Ambulatory Visit (HOSPITAL_COMMUNITY): Admission: RE | Admit: 2009-08-22 | Discharge: 2009-08-22 | Payer: Self-pay | Admitting: Family Medicine

## 2009-08-28 ENCOUNTER — Telehealth: Payer: Self-pay | Admitting: Internal Medicine

## 2009-08-30 ENCOUNTER — Ambulatory Visit: Payer: Self-pay | Admitting: Family Medicine

## 2009-09-10 ENCOUNTER — Ambulatory Visit: Payer: Self-pay | Admitting: Obstetrics & Gynecology

## 2009-09-10 ENCOUNTER — Other Ambulatory Visit: Payer: Self-pay | Admitting: Obstetrics & Gynecology

## 2009-09-24 ENCOUNTER — Ambulatory Visit (HOSPITAL_COMMUNITY): Admission: RE | Admit: 2009-09-24 | Discharge: 2009-09-24 | Payer: Self-pay | Admitting: Obstetrics and Gynecology

## 2009-09-24 ENCOUNTER — Ambulatory Visit: Payer: Self-pay | Admitting: Family Medicine

## 2009-09-26 ENCOUNTER — Telehealth: Payer: Self-pay | Admitting: Internal Medicine

## 2009-10-18 ENCOUNTER — Ambulatory Visit: Payer: Self-pay | Admitting: Obstetrics and Gynecology

## 2009-10-26 ENCOUNTER — Ambulatory Visit (HOSPITAL_COMMUNITY): Admission: RE | Admit: 2009-10-26 | Discharge: 2009-10-26 | Payer: Self-pay | Admitting: Family Medicine

## 2009-10-26 ENCOUNTER — Telehealth (INDEPENDENT_AMBULATORY_CARE_PROVIDER_SITE_OTHER): Payer: Self-pay | Admitting: *Deleted

## 2009-10-30 ENCOUNTER — Ambulatory Visit: Payer: Self-pay | Admitting: Internal Medicine

## 2009-10-30 LAB — CONVERTED CEMR LAB
Albumin: 3.5 g/dL (ref 3.5–5.2)
Alkaline Phosphatase: 31 units/L — ABNORMAL LOW (ref 39–117)
CO2: 23 meq/L (ref 19–32)
Glucose, Bld: 89 mg/dL (ref 70–99)
HIV 1 RNA Quant: <20 copies/mL (ref <20)
HIV-1 RNA Quant, Log: <1.3 (ref <1.30)
Lymphocytes Relative: 27 % (ref 12–46)
Lymphs Abs: 1.7 10*3/uL (ref 0.7–4.0)
Neutrophils Relative %: 65 % (ref 43–77)
Platelets: 165 10*3/uL (ref 150–400)
Potassium: 3.8 meq/L (ref 3.5–5.3)
Sodium: 137 meq/L (ref 135–145)
Total Protein: 6.3 g/dL (ref 6.0–8.3)
WBC: 6.2 10*3/uL (ref 4.0–10.5)

## 2009-11-08 ENCOUNTER — Ambulatory Visit: Payer: Self-pay | Admitting: Obstetrics & Gynecology

## 2009-11-19 ENCOUNTER — Ambulatory Visit: Payer: Self-pay | Admitting: Obstetrics & Gynecology

## 2009-11-19 ENCOUNTER — Encounter (INDEPENDENT_AMBULATORY_CARE_PROVIDER_SITE_OTHER): Payer: Self-pay | Admitting: *Deleted

## 2009-11-19 LAB — CONVERTED CEMR LAB
ALT: 12 units/L (ref 0–35)
AST: 13 units/L (ref 0–37)
Alkaline Phosphatase: 38 units/L — ABNORMAL LOW (ref 39–117)
Calcium: 9.2 mg/dL (ref 8.4–10.5)
Chloride: 102 meq/L (ref 96–112)
Creatinine 24 HR UR: 2134 mg/24hr — ABNORMAL HIGH (ref 700–1800)
Creatinine Clearance: 269 mL/min — ABNORMAL HIGH (ref 75–115)
Creatinine, Ser: 0.55 mg/dL (ref 0.40–1.20)
HCT: 34.8 % — ABNORMAL LOW (ref 36.0–46.0)
MCHC: 32.8 g/dL (ref 30.0–36.0)
Platelets: 164 10*3/uL (ref 150–400)
Protein, Ur: 103 mg/24hr — ABNORMAL HIGH (ref 50–100)
RDW: 14 % (ref 11.5–15.5)

## 2009-11-26 ENCOUNTER — Telehealth (INDEPENDENT_AMBULATORY_CARE_PROVIDER_SITE_OTHER): Payer: Self-pay | Admitting: *Deleted

## 2009-11-29 ENCOUNTER — Ambulatory Visit: Payer: Self-pay | Admitting: Family Medicine

## 2009-11-29 LAB — CONVERTED CEMR LAB

## 2009-11-30 ENCOUNTER — Telehealth (INDEPENDENT_AMBULATORY_CARE_PROVIDER_SITE_OTHER): Payer: Self-pay | Admitting: *Deleted

## 2009-11-30 ENCOUNTER — Ambulatory Visit: Payer: Self-pay | Admitting: Internal Medicine

## 2009-12-05 ENCOUNTER — Encounter: Payer: Self-pay | Admitting: Family Medicine

## 2009-12-05 ENCOUNTER — Ambulatory Visit (HOSPITAL_COMMUNITY): Admission: RE | Admit: 2009-12-05 | Discharge: 2009-12-05 | Payer: Self-pay | Admitting: Family Medicine

## 2009-12-10 ENCOUNTER — Ambulatory Visit: Payer: Self-pay | Admitting: Obstetrics and Gynecology

## 2009-12-10 ENCOUNTER — Inpatient Hospital Stay (HOSPITAL_COMMUNITY): Admission: AD | Admit: 2009-12-10 | Discharge: 2009-12-11 | Payer: Self-pay | Admitting: Obstetrics and Gynecology

## 2009-12-21 ENCOUNTER — Encounter: Payer: Self-pay | Admitting: Internal Medicine

## 2009-12-21 ENCOUNTER — Telehealth (INDEPENDENT_AMBULATORY_CARE_PROVIDER_SITE_OTHER): Payer: Self-pay | Admitting: *Deleted

## 2009-12-24 ENCOUNTER — Encounter: Payer: Self-pay | Admitting: Obstetrics and Gynecology

## 2009-12-24 ENCOUNTER — Ambulatory Visit: Payer: Self-pay | Admitting: Obstetrics & Gynecology

## 2009-12-24 ENCOUNTER — Inpatient Hospital Stay (HOSPITAL_COMMUNITY): Admission: AD | Admit: 2009-12-24 | Discharge: 2009-12-24 | Payer: Self-pay | Admitting: Obstetrics & Gynecology

## 2009-12-25 ENCOUNTER — Ambulatory Visit (HOSPITAL_COMMUNITY): Admission: RE | Admit: 2009-12-25 | Discharge: 2009-12-25 | Payer: Self-pay | Admitting: Obstetrics & Gynecology

## 2009-12-31 ENCOUNTER — Ambulatory Visit: Payer: Self-pay | Admitting: Obstetrics and Gynecology

## 2010-01-03 ENCOUNTER — Ambulatory Visit: Payer: Self-pay | Admitting: Obstetrics and Gynecology

## 2010-01-03 ENCOUNTER — Ambulatory Visit (HOSPITAL_COMMUNITY)
Admission: RE | Admit: 2010-01-03 | Discharge: 2010-01-03 | Payer: Self-pay | Source: Home / Self Care | Admitting: Family Medicine

## 2010-01-14 ENCOUNTER — Encounter: Payer: Self-pay | Admitting: Obstetrics and Gynecology

## 2010-01-14 ENCOUNTER — Encounter (INDEPENDENT_AMBULATORY_CARE_PROVIDER_SITE_OTHER): Payer: Self-pay | Admitting: *Deleted

## 2010-01-14 ENCOUNTER — Ambulatory Visit: Payer: Self-pay | Admitting: Obstetrics & Gynecology

## 2010-01-14 ENCOUNTER — Ambulatory Visit (HOSPITAL_COMMUNITY)
Admission: RE | Admit: 2010-01-14 | Discharge: 2010-01-14 | Payer: Self-pay | Source: Home / Self Care | Admitting: Family Medicine

## 2010-01-21 ENCOUNTER — Ambulatory Visit: Payer: Self-pay | Admitting: Internal Medicine

## 2010-01-21 LAB — CONVERTED CEMR LAB: HIV 1 RNA Quant: <20 copies/mL (ref <20)

## 2010-01-24 ENCOUNTER — Ambulatory Visit: Payer: Self-pay | Admitting: Obstetrics & Gynecology

## 2010-01-28 ENCOUNTER — Ambulatory Visit: Payer: Self-pay | Admitting: Obstetrics and Gynecology

## 2010-01-28 ENCOUNTER — Encounter: Payer: Self-pay | Admitting: Internal Medicine

## 2010-01-28 LAB — CONVERTED CEMR LAB: GC Probe Amp, Genital: NEGATIVE

## 2010-01-29 ENCOUNTER — Telehealth (INDEPENDENT_AMBULATORY_CARE_PROVIDER_SITE_OTHER): Payer: Self-pay | Admitting: *Deleted

## 2010-01-29 ENCOUNTER — Encounter: Payer: Self-pay | Admitting: Internal Medicine

## 2010-01-31 ENCOUNTER — Ambulatory Visit: Payer: Self-pay | Admitting: Obstetrics & Gynecology

## 2010-02-04 ENCOUNTER — Ambulatory Visit: Payer: Self-pay | Admitting: Obstetrics & Gynecology

## 2010-02-07 ENCOUNTER — Ambulatory Visit (HOSPITAL_COMMUNITY)
Admission: RE | Admit: 2010-02-07 | Discharge: 2010-02-07 | Payer: Self-pay | Source: Home / Self Care | Attending: Family Medicine | Admitting: Family Medicine

## 2010-02-07 ENCOUNTER — Ambulatory Visit: Payer: Self-pay | Admitting: Obstetrics and Gynecology

## 2010-02-09 ENCOUNTER — Inpatient Hospital Stay (HOSPITAL_COMMUNITY)
Admission: AD | Admit: 2010-02-09 | Discharge: 2010-02-11 | Payer: Self-pay | Source: Home / Self Care | Attending: Obstetrics and Gynecology | Admitting: Obstetrics and Gynecology

## 2010-02-22 ENCOUNTER — Ambulatory Visit: Admit: 2010-02-22 | Payer: Self-pay | Admitting: Internal Medicine

## 2010-02-28 ENCOUNTER — Ambulatory Visit
Admission: RE | Admit: 2010-02-28 | Discharge: 2010-02-28 | Payer: Self-pay | Source: Home / Self Care | Attending: Internal Medicine | Admitting: Internal Medicine

## 2010-03-10 ENCOUNTER — Encounter: Payer: Self-pay | Admitting: Maternal and Fetal Medicine

## 2010-03-10 ENCOUNTER — Encounter: Payer: Self-pay | Admitting: Obstetrics and Gynecology

## 2010-03-18 ENCOUNTER — Encounter (INDEPENDENT_AMBULATORY_CARE_PROVIDER_SITE_OTHER): Payer: Self-pay | Admitting: *Deleted

## 2010-03-19 NOTE — Letter (Signed)
Summary: Generic Letter  [facility]  [address]   [place], [place] [address]   Phone: [phone]  Fax: [phone]          March 30, 2009  [facility] [address] [place], [place]  [address]  Dear Ms. [facility],  It is time to call the clinic to schedule an appointment for your Annual PAP Smear.  Please call the number above to schedule this important yearly test.  I look forward to seeing you soon.    Thank you.  Sincerely,    Jennet Maduro RN [facility]

## 2010-03-19 NOTE — Progress Notes (Signed)
Summary: NCADAP meds arrived for Aug  Phone Note Refill Request      Prescriptions: KALETRA 200-50 MG TABS (LOPINAVIR-RITONAVIR) Take 2  tablets by mouth two times a day  #120 x 0   Entered by:   Paulo Fruit  BS,CPht II,MPH   Authorized by:   Yisroel Ramming MD   Signed by:   Paulo Fruit  BS,CPht II,MPH on 09/26/2009   Method used:   Samples Given   RxID:   [id number] COMBIVIR 150-300 MG TABS (LAMIVUDINE-ZIDOVUDINE) Take 1 tablet by mouth two times a day  #60 x 0   Entered by:   Paulo Fruit  BS,CPht II,MPH   Authorized by:   Yisroel Ramming MD   Signed by:   Paulo Fruit  BS,CPht II,MPH on 09/26/2009   Method used:   Samples Given   RxID:   [id number]  Patient Assist Medication Verification: Medication name: Kaletra 200/50mg  RX # [id number] Tech approval:MLD  Patient Assist Medication Verification: Medication name:Combivir 150/300mg  RX # [id number] Tech approval:MLD Call placed to patient with message that assistance medications are ready for pick-up. Informed patient that she also has last months meds here (July) as well that has not been picked up. Paulo Fruit  BS,CPht II,MPH  September 26, 2009 4:11 PM

## 2010-03-19 NOTE — Progress Notes (Signed)
Summary: NCADAP rxes arrived, letter sent  Phone Note Outgoing Call   Call placed by: Jennet Maduro RN,  December 21, 2009 4:39 PM Call placed to: Patient Action Taken: Assistance medications ready for pick up Summary of Call: Letter sent to pt.  Non-working telephone. Jennet Maduro RN  December 21, 2009 4:41 PM     Prescriptions: [place] 200-50 MG TABS (LOPINAVIR-RITONAVIR) Take 2  tablets by mouth two times a day  #120 x 0   Entered by:   Jennet Maduro RN   Authorized by:   Yisroel Ramming MD   Signed by:   Jennet Maduro RN on 12/21/2009   Method used:   Electronically to        [facility] [phone]* (retail)       [address]       [place], [place]  [address]       Ph: [phone]       Fax:    RxID:   [id number] COMBIVIR 150-300 MG TABS (LAMIVUDINE-ZIDOVUDINE) Take 1 tablet by mouth two times a day  #60 x 0   Entered by:   Jennet Maduro RN   Authorized by:   Yisroel Ramming MD   Signed by:   Jennet Maduro RN on 12/21/2009   Method used:   Electronically to        [facility] [phone]* (retail)       [address]       [place], [place]  [address]       Ph: [phone]       Fax:    RxID:   [id number]   Appended Document: NCADAP rxes arrived, letter sent pt. picked up ADAP meds

## 2010-03-19 NOTE — Progress Notes (Signed)
Summary: NCADAP/pt assist med arrived for Feb  Phone Note Refill Request      Prescriptions: ATRIPLA 600-200-300 MG TABS (EFAVIRENZ-EMTRICITAB-TENOFOVIR) Take 1 tablet by mouth once a day  #30 x 0   Entered by:   Paulo Fruit  BS,CPht II,MPH   Authorized by:   Yisroel Ramming MD   Signed by:   Paulo Fruit  BS,CPht II,MPH on 03/31/2009   Method used:   Samples Given   RxID:   [id number]   Patient Assist Medication Verification: Medication: Atripla Lot# [id number] Exp Date:06 2012 Tech approval:MLD Will call pt on Monday, 04/02/09 Paulo Fruit  BS,CPht II,MPH  March 31, 2009 10:17 AM

## 2010-03-19 NOTE — Assessment & Plan Note (Signed)
Summary: 3month f/u/vs   CC:  follow-up visit, lab results, and pt. several weeks pregnant.  History of Present Illness: Pt states that she is about [redacted] weeks pregnant. She is tolerating the Combivir and Kaletra but looks forward to going back to Atripla.  Preventive Screening-Counseling & Management  Alcohol-Tobacco     Alcohol drinks/day: 0     Smoking Status: never     Passive Smoke Exposure: no  Caffeine-Diet-Exercise     Caffeine use/day: tea     Does Patient Exercise: yes     Type of exercise: walking     Exercise (avg: min/session): 30-60     Times/week: <3  Hep-HIV-STD-Contraception     HIV Risk: no  Safety-Violence-Falls     Seat Belt Use: yes      Sexual History:  currently monogamous.        Drug Use:  no.     Updated Prior Medication List: HYDROCHLOROTHIAZIDE 25 MG TABS (HYDROCHLOROTHIAZIDE) once daily COMBIVIR 150-300 MG TABS (LAMIVUDINE-ZIDOVUDINE) Take 1 tablet by mouth two times a day KALETRA 200-50 MG TABS (LOPINAVIR-RITONAVIR) Take 2  tablets by mouth two times a day  Current Allergies (reviewed today): No known allergies  Past History:  Past Medical History: Last updated: 12/24/2005 HIV disease Chlamydia infection High risk intrauterine pregnancy, 10/05, 2/07  Review of Systems  The patient denies anorexia, fever, and weight loss.    Vital Signs:  Patient profile:   35 year old female Height:      64 inches (162.56 cm) Weight:      228.8 pounds (104 kg) BMI:     39.42 Temp:     98.5 degrees F (36.94 degrees C) oral Pulse rate:   80 / minute BP sitting:   128 / 82  (right arm)  Vitals Entered By: [name] CMA Duncan Dull) (July 23, 2009 9:42 AM) CC: follow-up visit, lab results, pt. several weeks pregnant Is Patient Diabetic? No Pain Assessment Patient in pain? no      Nutritional Status BMI of > 30 = obese Nutritional Status Detail appetite "on and off"  Does patient need assistance? Functional Status Self  care Ambulation Normal Comments no missed doses of meds per patient   Physical Exam  General:  alert, well-developed, well-nourished, and well-hydrated.   Head:  normocephalic and atraumatic.   Mouth:  pharynx pink and moist.  no thrush  Lungs:  normal breath sounds.      Impression & Recommendations:  Problem # 1:  HIV DISEASE (ICD-042) Pt's last VL <48.  Will refer to high risk OB for care.  Pt to return in 3 months for repeat labs.  she is to contine her Combivir and Kaletra. Orders: T-Pregnancy (Serum), Qual.  [phone]) Obstetric Referral (Obstetric)  Diagnostics Reviewed:  HIV: HIV positive - AIDS status unknown (04/14/2008)   CD4: 700 (07/05/2009)   WBC: 5.9 (07/04/2009)   Hgb: 12.1 (07/04/2009)   HCT: 37.3 (07/04/2009)   Platelets: 188 (07/04/2009) HIV-1 RNA: <48 copies/mL (07/04/2009)   HBSAg: NO (04/13/2006)  Problem # 2:  PREGNANCY, HIGH RISK (ICD-V23.9) confirm with serum pregnancy test. Orders: Obstetric Referral (Obstetric)  Other Orders: Est. Patient Level III (308[address]) Future Orders: T-CD4SP ([facility]) (CD4SP) ... 10/21/2009 T-HIV Viral Load [phone]) ... 10/21/2009 T-Comprehensive Metabolic Panel [phone]) ... 10/21/2009 T-CBC w/Diff ([phone]) ... 10/21/2009  Patient Instructions: 1)  Please schedule a follow-up appointment in 3 months, 2 weeks after labs.

## 2010-03-19 NOTE — Progress Notes (Signed)
Summary: NCADAP/pt assist med arrived for Jun via [facility] ADAP  Phone Note Refill Request      Prescriptions: KALETRA 200-50 MG TABS (LOPINAVIR-RITONAVIR) Take 2  tablets by mouth two times a day  #120 x 0   Entered by:   Paulo Fruit  BS,CPht II,MPH   Authorized by:   Yisroel Ramming MD   Signed by:   Paulo Fruit  BS,CPht II,MPH on 07/24/2009   Method used:   Samples Given   RxID:   [id number] COMBIVIR 150-300 MG TABS (LAMIVUDINE-ZIDOVUDINE) Take 1 tablet by mouth two times a day  #60 x 0   Entered by:   Paulo Fruit  BS,CPht II,MPH   Authorized by:   Yisroel Ramming MD   Signed by:   Paulo Fruit  BS,CPht II,MPH on 07/24/2009   Method used:   Samples Given   RxID:   [id number]  Patient Assist Medication Verification: Medication name:Kaletra 200/50mg  RX # [id number] Tech approval:MLD  Patient Assist Medication Verification: Medication name:Combivir 150/300mg  RX # [id number] Tech approval:MLD Call placed to patient with message that assistance medications are ready for pick-up. Left message on patient's voicemail. Paulo Fruit  BS,CPht II,MPH  July 24, 2009 3:10 PM

## 2010-03-19 NOTE — Miscellaneous (Signed)
Summary: clinical update/ryan white NCADAP apprv til 05/18/10  Clinical Lists Changes  Observations: Added new observation of AIDSDAP: Yes 2011 (05/28/2009 10:32)

## 2010-03-19 NOTE — Progress Notes (Signed)
Summary: ADAP Kaletra - Combivir  Phone Note Outgoing Call   Summary of Call: ADAP Combivir & Kaletra  Medication(s) have arrived.  Left message or spoke to patient advising them they were ready for pickup.   Initial call taken by: Altamease Oiler,  November 26, 2009 4:42 PM

## 2010-03-19 NOTE — Progress Notes (Signed)
Summary: ncadap meds arrived for Sept  Phone Note Refill Request      Prescriptions: KALETRA 200-50 MG TABS (LOPINAVIR-RITONAVIR) Take 2  tablets by mouth two times a day  #120 x 0   Entered by:   Paulo Fruit  BS,CPht II,MPH   Authorized by:   Yisroel Ramming MD   Signed by:   Paulo Fruit  BS,CPht II,MPH on 10/26/2009   Method used:   Samples Given   RxID:   [id number] COMBIVIR 150-300 MG TABS (LAMIVUDINE-ZIDOVUDINE) Take 1 tablet by mouth two times a day  #60 x 0   Entered by:   Paulo Fruit  BS,CPht II,MPH   Authorized by:   Yisroel Ramming MD   Signed by:   Paulo Fruit  BS,CPht II,MPH on 10/26/2009   Method used:   Samples Given   RxID:   [id number]  Patient Assist Medication Verification: Medication name: Kaletra 200/50mg  RX # [id number] Tech approval:MLD  Patient Assist Medication Verification: Medication name:Combivir 150/300mg  RX # [id number] Tech approval:MLD Call placed to patient with message that assistance medications are ready for pick-up. patient said she will pick up during her appt next week (9/12-9/16) Paulo Fruit  BS,CPht II,MPH  October 26, 2009 4:08 PM

## 2010-03-19 NOTE — Progress Notes (Signed)
Summary: NCADAP new meds arrived for Apr  Phone Note Refill Request      Prescriptions: KALETRA 200-50 MG TABS (LOPINAVIR-RITONAVIR) Take 2  tablets by mouth two times a day  #120 x 0   Entered by:   Paulo Fruit  BS,CPht II,MPH   Authorized by:   Yisroel Ramming MD   Signed by:   Paulo Fruit  BS,CPht II,MPH on 05/25/2009   Method used:   Samples Given   RxID:   [id number] COMBIVIR 150-300 MG TABS (LAMIVUDINE-ZIDOVUDINE) Take 1 tablet by mouth two times a day  #60 x 0   Entered by:   Paulo Fruit  BS,CPht II,MPH   Authorized by:   Yisroel Ramming MD   Signed by:   Paulo Fruit  BS,CPht II,MPH on 05/25/2009   Method used:   Samples Given   RxID:   [id number]   Patient Assist Medication Verification: Medication: Combivir 150mg /300mg  Lot# [id number] Exp Date:Oct 2014 Tech approval:MLD                Patient Assist Medication Verification: Medication:Kaletra 200/50mg  Lot# [id number] Exp Date:29 Nov 2011 Tech approval:MLD Call placed to patient with message that assistance medications are ready for pick-up. Left message on VM for patient. Paulo Fruit  BS,CPht II,MPH  May 25, 2009 3:04 PM

## 2010-03-19 NOTE — Progress Notes (Signed)
Summary: NCADAP/pt assist med arrived for Jan  Phone Note Refill Request      Prescriptions: ATRIPLA 600-200-300 MG TABS (EFAVIRENZ-EMTRICITAB-TENOFOVIR) Take 1 tablet by mouth once a day  #30 x 0   Entered by:   Paulo Fruit  BS,CPht II,MPH   Authorized by:   Yisroel Ramming MD   Signed by:   Paulo Fruit  BS,CPht II,MPH on 03/01/2009   Method used:   Samples Given   RxID:   [id number]   Patient Assist Medication Verification: Medication: Atripla [id number] Exp Date:06 2013 Tech approval:MLD Call placed to patient with message that assistance medications are ready for pick-up. Paulo Fruit  BS,CPht II,MPH  March 01, 2009 2:32 PM

## 2010-03-19 NOTE — Progress Notes (Signed)
Summary: NCADAP/pt assist meds arrived for Jul  Phone Note Refill Request      Prescriptions: KALETRA 200-50 MG TABS (LOPINAVIR-RITONAVIR) Take 2  tablets by mouth two times a day  #120 x 0   Entered by:   Paulo Fruit  BS,CPht II,MPH   Authorized by:   Yisroel Ramming MD   Signed by:   Paulo Fruit  BS,CPht II,MPH on 08/28/2009   Method used:   Samples Given   RxID:   [id number] COMBIVIR 150-300 MG TABS (LAMIVUDINE-ZIDOVUDINE) Take 1 tablet by mouth two times a day  #60 x 0   Entered by:   Paulo Fruit  BS,CPht II,MPH   Authorized by:   Yisroel Ramming MD   Signed by:   Paulo Fruit  BS,CPht II,MPH on 08/28/2009   Method used:   Samples Given   RxID:   [id number]  Patient Assist Medication Verification: Medication name: Combivir 150/300mg  RX # [id number] Tech approval:MLD  Patient Assist Medication Verification: Medication name:Kaletra 200/50mg  RX # [id number] Tech approval:MLD Call placed to patient with message that assistance medications are ready for pick-up. Paulo Fruit  BS,CPht II,MPH  August 28, 2009 11:16 AM

## 2010-03-19 NOTE — Assessment & Plan Note (Signed)
Summary: 63month f/u [mkj]   CC:  follow-up visit and lab results.  History of Present Illness: Pt doing well.  She is interested in getting pregnant.  Her partner is HIV negative and knows her status.  Preventive Screening-Counseling & Management  Alcohol-Tobacco     Alcohol drinks/day: 0     Smoking Status: never     Passive Smoke Exposure: no  Caffeine-Diet-Exercise     Caffeine use/day: tea     Does Patient Exercise: yes     Type of exercise: walking     Exercise (avg: min/session): 30-60     Times/week: <3  Hep-HIV-STD-Contraception     HIV Risk: no  Safety-Violence-Falls     Seat Belt Use: yes      Sexual History:  currently monogamous.        Drug Use:  no.     Updated Prior Medication List: HYDROCHLOROTHIAZIDE 25 MG TABS (HYDROCHLOROTHIAZIDE) once daily COMBIVIR 150-300 MG TABS (LAMIVUDINE-ZIDOVUDINE) Take 1 tablet by mouth two times a day KALETRA 200-50 MG TABS (LOPINAVIR-RITONAVIR) Take 2  tablets by mouth two times a day  Current Allergies (reviewed today): No known allergies  Past History:  Past Medical History: Last updated: 12/24/2005 HIV disease Chlamydia infection High risk intrauterine pregnancy, 10/05, 2/07  Social History: Sexual History:  currently monogamous  Review of Systems  The patient denies anorexia, fever, and weight loss.    Vital Signs:  Patient profile:   35 year old female Height:      64 inches (162.56 cm) Weight:      230.8 pounds (104.91 kg) BMI:     39.76 Temp:     98.6 degrees F (37.00 degrees C) oral Pulse rate:   83 / minute BP sitting:   129 / 89  (left arm)  Vitals Entered By: [name] CMA Duncan Dull) (May 23, 2009 9:25 AM) CC: follow-up visit, lab results Is Patient Diabetic? No Pain Assessment Patient in pain? no      Nutritional Status BMI of > 30 = obese Nutritional Status Detail appetite"ok"  Does patient need assistance? Functional Status Cook/clean Ambulation Normal Comments no  missed doses of meds per patient   Physical Exam  General:  alert, well-developed, well-nourished, and well-hydrated.   Head:  normocephalic and atraumatic.   Lungs:  normal breath sounds.      Impression & Recommendations:  Problem # 1:  HIV DISEASE (ICD-042) Discussed need to change HIV meds due to her desire to get pregnant.  Will switch to combivir and Kaletra.  Original genotype did not show resistance.  Will have her return in 6 weeks for labs.  Discussed risk of HIV infection in her partner.  she expressed understanding . Diagnostics Reviewed:  HIV: HIV positive - AIDS status unknown (04/14/2008)   CD4: 640 (04/24/2009)   WBC: 5.4 (04/23/2009)   Hgb: 12.7 (04/23/2009)   HCT: 38.3 (04/23/2009)   Platelets: 178 (04/23/2009) HIV-1 RNA: 52 (04/23/2009)   HBSAg: NO (04/13/2006)  Medications Added to Medication List This Visit: 1)  Combivir 150-300 Mg Tabs (Lamivudine-zidovudine) .... Take 1 tablet by mouth two times a day 2)  Kaletra 200-50 Mg Tabs (Lopinavir-ritonavir) .... Take 2  tablets by mouth two times a day  Other Orders: Est. Patient Level III (60454) Future Orders: T-CD4SP ([facility]) (CD4SP) ... 07/04/2009 T-HIV Viral Load [phone]) ... 07/04/2009 T-Comprehensive Metabolic Panel [phone]) ... 07/04/2009 T-CBC w/Diff ([id number]) ... 07/04/2009  Patient Instructions: 1)  Please schedule a follow-up appointment in 8  weeks, 2 weeks after labs.  Prescriptions: KALETRA 200-50 MG TABS (LOPINAVIR-RITONAVIR) Take 2  tablets by mouth two times a day  #120 x 5   Entered and Authorized by:   Yisroel Ramming MD   Signed by:   Yisroel Ramming MD on 05/23/2009   Method used:   Print then Give to Patient   RxID:   [id number] COMBIVIR 150-300 MG TABS (LAMIVUDINE-ZIDOVUDINE) Take 1 tablet by mouth two times a day  #60 x 5   Entered and Authorized by:   Yisroel Ramming MD   Signed by:   Yisroel Ramming MD on 05/23/2009   Method used:   Print then Give to Patient   RxID:    [id number]

## 2010-03-19 NOTE — Progress Notes (Signed)
Summary: NCADAP/pt assist meds arrived for May  Phone Note Refill Request      Prescriptions: KALETRA 200-50 MG TABS (LOPINAVIR-RITONAVIR) Take 2  tablets by mouth two times a day  #120 x 0   Entered by:   Paulo Fruit  BS,CPht II,MPH   Authorized by:   Yisroel Ramming MD   Signed by:   Paulo Fruit  BS,CPht II,MPH on 06/22/2009   Method used:   Samples Given   RxID:   [id number] COMBIVIR 150-300 MG TABS (LAMIVUDINE-ZIDOVUDINE) Take 1 tablet by mouth two times a day  #60 x 0   Entered by:   Paulo Fruit  BS,CPht II,MPH   Authorized by:   Yisroel Ramming MD   Signed by:   Paulo Fruit  BS,CPht II,MPH on 06/22/2009   Method used:   Samples Given   RxID:   [id number]   Patient Assist Medication Verification: Medication: Kaletra 200/50mg  Lot# [id number] Exp Date:12 Oct 2011 Tech approval:MLD                Patient Assist Medication Verification: Medication:Combivir 150mg /300mg  Lot# [id number] Exp Date:Dec 2014 Tech approval:MLD Call placed to patient with message that assistance medications are ready for pick-up. Left message on patient's voicemail Paulo Fruit  BS,CPht II,MPH  Jun 22, 2009 10:37 AM                   Appended Document: NCADAP/pt assist meds arrived for May Prescription/Samples picked up by: patient

## 2010-03-19 NOTE — Assessment & Plan Note (Addendum)
Summary: f/u [mkj]   CC:  follow-up visit and lab results.  History of Present Illness: Pt is about [redacted] weeks pregnant. She and baby are doing well. She had some contractions this past weekend but they stopped. No missed doses of her HIV meds but is looking forward to going back to a once a day regimen.  Preventive Screening-Counseling & Management  Alcohol-Tobacco     Alcohol drinks/day: 0     Smoking Status: never     Passive Smoke Exposure: no  Caffeine-Diet-Exercise     Caffeine use/day: tea     Does Patient Exercise: yes     Type of exercise: walking     Exercise (avg: min/session): 30-60     Times/week: <3  Hep-HIV-STD-Contraception     HIV Risk: risk noted  Safety-Violence-Falls     Seat Belt Use: yes      Sexual History:  currently monogamous.        Drug Use:  never and no.     Updated Prior Medication List: COMBIVIR 150-300 MG TABS (LAMIVUDINE-ZIDOVUDINE) Take 1 tablet by mouth two times a day KALETRA 200-50 MG TABS (LOPINAVIR-RITONAVIR) Take 2  tablets by mouth two times a day PRENAVITE MULTIPLE VITAMIN 28-0.8 MG TABS (PRENATAL VIT-FE FUMARATE-FA) Take 1 tablet by mouth once a day  Current Allergies (reviewed today): No known allergies  Past History:  Past Medical History: Last updated: 12/24/2005 HIV disease Chlamydia infection High risk intrauterine pregnancy, 10/05, 2/07  Social History: Drug Use:  never, no  Review of Systems  The patient denies anorexia, fever, and abdominal pain.    Vital Signs:  Patient profile:   35 year old female Height:      64 inches (162.56 cm) Weight:      238.12 pounds (108.24 kg) BMI:     41.02 Temp:     98.7 degrees F (37.06 degrees C) oral Pulse rate:   89 / minute BP sitting:   122 / 74  (right arm)  Vitals Entered By: [name] CMA Duncan Dull) (November 30, 2009 10:21 AM) CC: follow-up visit, lab results Is Patient Diabetic? No Pain Assessment Patient in pain? no      Nutritional Status BMI  of > 30 = obese Nutritional Status Detail appetite "ok"  Does patient need assistance? Functional Status Self care Ambulation Normal Comments no missed doses of meds per pt.   Physical Exam  General:  alert, well-developed, well-nourished, and well-hydrated.   Head:  normocephalic and atraumatic.   Mouth:  pharynx pink and moist.   Lungs:  normal breath sounds.     Impression & Recommendations:  Problem # 1:  HIV DISEASE (ICD-042) VL <20 so vaginal delivery is safe will send results to high risk OB will repeat labs in 6 weeks can switch meds back once baby is delivered Diagnostics Reviewed:  HIV: HIV positive - AIDS status unknown (04/14/2008)   CD4: 490 (10/31/2009)   WBC: 7.2 (11/19/2009)   Hgb: 11.4 (11/19/2009)   HCT: 34.8 (11/19/2009)   Platelets: 164 (11/19/2009) HIV-1 RNA: <20 copies/mL (10/30/2009)   HBSAg: NEG (08/15/2009)  Medications Added to Medication List This Visit: 1)  Prenavite Multiple Vitamin 28-0.8 Mg Tabs (Prenatal vit-fe fumarate-fa) .... Take 1 tablet by mouth once a day  Other Orders: Est. Patient Level III ([address]) Future Orders: T-CD4SP ([facility]) (CD4SP) ... 01/11/2010 T-HIV Viral Load [phone]) ... 01/11/2010  Patient Instructions: 1)  follow-up after baby is delivered to address changing medication  Prescriptions: PRENAVITE MULTIPLE  VITAMIN 28-0.8 MG TABS (PRENATAL VIT-FE FUMARATE-FA) Take 1 tablet by mouth once a day  #30 x 2   Entered and Authorized by:   Yisroel Ramming MD   Signed by:   Yisroel Ramming MD on 11/30/2009   Method used:   Print then Give to Patient   RxID:   [id number]      Immunization History:  Influenza Immunization History:    Influenza:  historical (11/29/2009)

## 2010-03-19 NOTE — Letter (Signed)
Summary: [facility]  [address]   [place], [place] [address]   Phone: [phone]  Fax: [phone]           December 24, 2009  [facility] [address] [place], [place]  [address]  Dear Ms. [facility],  Your ADAP medications are ready for pick up here at the Center.  Please come as soon as possible so that you do not miss any doses while you are pregnant.  Thank you.  Sincerely,    Jennet Maduro RN

## 2010-03-19 NOTE — Miscellaneous (Signed)
  Clinical Lists Changes  Observations: Added new observation of YEARAIDSPOS: 2005  (01/14/2010 14:55) Added new observation of HIV STATUS: CDC-defined AIDS  (01/14/2010 14:55)

## 2010-03-19 NOTE — Progress Notes (Signed)
Summary: labs faxed to [facility]  Phone Note Outgoing Call   Call placed by: Annice Pih Summary of Call: 10/30/09 labs faxed to [facility] Initial call taken by: [name] CMA Duncan Dull),  November 30, 2009 11:36 AM

## 2010-03-19 NOTE — Progress Notes (Signed)
Summary: [place] med arrived for Mar  Phone Note Refill Request      Prescriptions: ATRIPLA 600-200-300 MG TABS (EFAVIRENZ-EMTRICITAB-TENOFOVIR) Take 1 tablet by mouth once a day  #30 x 0   Entered by:   Paulo Fruit  BS,CPht II,MPH   Authorized by:   Yisroel Ramming MD   Signed by:   Paulo Fruit  BS,CPht II,MPH on 04/30/2009   Method used:   Samples Given   RxID:   [id number]   Patient Assist Medication Verification: Medication: Atripla Lot# [id number] Exp Date:07 2013 Tech approval:MLD Call placed to patient with message that assistance medications are ready for pick-up. Paulo Fruit  BS,CPht II,MPH  April 30, 2009 4:04 PM

## 2010-03-21 NOTE — Progress Notes (Signed)
Summary: labs faxed to [facility]  Phone Note Call from Patient   Caller: Patient Summary of Call: labs faxed to [facility], pt. to be induced 02/09/10 Initial call taken by: [name] CMA Duncan Dull),  January 29, 2010 8:53 AM

## 2010-03-21 NOTE — Assessment & Plan Note (Signed)
Summary: f/u [mkj]   CC:  follow-up visit, pt. delivered baby 02/10/10, and discuss new regimen.  History of Present Illness: patient delivered her baby boy on 12/25. he is doing well and taking his AZT without a problem.  She complains of feeling fatigued but is otherwise doing well.  She discontinued her Combivir and Kaletra but would like to get back on her Tyrone Nine now that she is no longer pregnant.  Preventive Screening-Counseling & Management  Alcohol-Tobacco     Alcohol drinks/day: 0     Smoking Status: never     Passive Smoke Exposure: no  Caffeine-Diet-Exercise     Caffeine use/day: tea     Does Patient Exercise: yes     Type of exercise: walking     Exercise (avg: min/session): 30-60     Times/week: <3  Hep-HIV-STD-Contraception     HIV Risk: no risk noted  Safety-Violence-Falls     Seat Belt Use: yes      Sexual History:  currently monogamous.        Drug Use:  never and no.    Comments: pt. declined condoms   Updated Prior Medication List: ATRIPLA 600-200-300 MG TABS (EFAVIRENZ-EMTRICITAB-TENOFOVIR)   Current Allergies (reviewed today): No known allergies  Past History:  Past Medical History: Last updated: 12/24/2005 HIV disease Chlamydia infection High risk intrauterine pregnancy, 10/05, 2/07  Review of Systems  The patient denies anorexia, fever, headaches, and abdominal pain.    Vital Signs:  Patient profile:   35 year old female Height:      64 inches (162.56 cm) Weight:      213.0 pounds (96.82 kg) BMI:     36.69 Temp:     98.7 degrees F (37.06 degrees C) oral Pulse rate:   72 / minute BP sitting:   144 / 88  (right arm)  Vitals Entered By: [name] CMA Duncan Dull) (February 28, 2010 10:29 AM) CC: follow-up visit, pt. delivered baby 02/10/10, discuss new regimen Is Patient Diabetic? No Pain Assessment Patient in pain? no      Nutritional Status BMI of > 30 = obese Nutritional Status Detail appetite "ok"  Have you ever  been in a relationship where you felt threatened, hurt or afraid?No   Does patient need assistance? Functional Status Self care Ambulation Normal Comments no missed doses of meds per pt.   Physical Exam  General:  alert, well-developed, and well-nourished.   Head:  normocephalic and atraumatic.   Mouth:  pharynx pink and moist.     Impression & Recommendations:  Problem # 1:  HIV DISEASE (ICD-042) Will switch her back to Atripla now that she is no longer pregnant.  Will check labs in 6 weeks and have her return 2 weeks later. Diagnostics Reviewed:  HIV: CDC-defined AIDS (01/14/2010)   CD4: 440 (01/22/2010)   WBC: 7.2 (11/19/2009)   Hgb: 11.4 (11/19/2009)   HCT: 34.8 (11/19/2009)   Platelets: 164 (11/19/2009) HIV-1 RNA: <20 copies/mL (01/21/2010)   HBSAg: NEG (08/15/2009)  Medications Added to Medication List This Visit: 1)  Atripla 600-200-300 Mg Tabs (Efavirenz-emtricitab-tenofovir) 2)  Atripla 600-200-300 Mg Tabs (Efavirenz-emtricitab-tenofovir) .... Take 1 tablet by mouth once a day  Other Orders: Est. Patient Level III (60454) Pneumococcal Vaccine (09811) Admin 1st Vaccine (91478) Future Orders: T-CD4SP ([facility]) (CD4SP) ... 04/11/2010 T-HIV Viral Load [id number]) ... 04/11/2010 T-Comprehensive Metabolic Panel [id number]) ... 04/11/2010 T-CBC w/Diff ([id number]) ... 04/11/2010 T-Lipid Profile [phone]) ... 04/11/2010  Patient Instructions: 1)  Please schedule a follow-up appointment  in 8 weeks, 2 weeks after labs.  Prescriptions: ATRIPLA 600-200-300 MG TABS (EFAVIRENZ-EMTRICITAB-TENOFOVIR) Take 1 tablet by mouth once a day  #30 x 5   Entered by:   [name] CMA ( AAMA)   Authorized by:   Yisroel Ramming MD   Signed by:   [name] CMA ( AAMA) on 02/28/2010   Method used:   Telephoned to ...       [facility] [phone]* (retail)       [address]       [address]       [place], [place]  [address]       Ph: [phone]        Fax: [phone]   RxID:   [id number]     Immunizations Administered:  Pneumonia Vaccine:    Vaccine Type: Pneumovax    Site: left deltoid    Mfr: Merck    Dose: 0.5 ml    Route: IM    Given by: [name] CMA ( AAMA)    Exp. Date: 06/27/2011    Lot #: [id number]    VIS given: 01/22/09 version given February 28, 2010.

## 2010-03-25 ENCOUNTER — Ambulatory Visit: Payer: Self-pay | Admitting: Family Medicine

## 2010-03-27 NOTE — Miscellaneous (Signed)
Summary: [facility] Update   Clinical Lists Changes  Observations: Added new observation of AIDSDAP: Pending approval for 2012 (03/18/2010 13:17) Added new observation of PCTFPL: 11.86  (03/18/2010 13:17) Added new observation of FINASSESSDT: 03/18/2010  (03/18/2010 13:17) Added new observation of YEARLYEXPEN: 2172  (03/18/2010 13:17)

## 2010-04-01 ENCOUNTER — Encounter (INDEPENDENT_AMBULATORY_CARE_PROVIDER_SITE_OTHER): Payer: Self-pay | Admitting: *Deleted

## 2010-04-03 ENCOUNTER — Encounter (INDEPENDENT_AMBULATORY_CARE_PROVIDER_SITE_OTHER): Payer: Self-pay | Admitting: *Deleted

## 2010-04-10 NOTE — Miscellaneous (Signed)
Summary: ADAP approved through 11/17/10  Clinical Lists Changes  Observations: Added new observation of AIDSDAP: Yes 2012 (04/01/2010 10:46)

## 2010-04-10 NOTE — Miscellaneous (Signed)
  Clinical Lists Changes  Observations: Added new observation of PCTFPL: 35.59  (04/03/2010 16:00) Added new observation of HOUSEINCOME: 6516  (04/03/2010 16:00)

## 2010-04-11 ENCOUNTER — Other Ambulatory Visit: Payer: Self-pay

## 2010-04-15 ENCOUNTER — Ambulatory Visit (INDEPENDENT_AMBULATORY_CARE_PROVIDER_SITE_OTHER): Payer: Self-pay | Admitting: Family Medicine

## 2010-04-15 DIAGNOSIS — B20 Human immunodeficiency virus [HIV] disease: Secondary | ICD-10-CM

## 2010-04-15 DIAGNOSIS — I10 Essential (primary) hypertension: Secondary | ICD-10-CM

## 2010-04-25 ENCOUNTER — Ambulatory Visit: Payer: Self-pay | Admitting: Internal Medicine

## 2010-04-25 ENCOUNTER — Ambulatory Visit: Payer: Self-pay | Admitting: Adult Health

## 2010-04-25 ENCOUNTER — Encounter: Payer: Self-pay | Admitting: Licensed Clinical Social Worker

## 2010-04-29 ENCOUNTER — Other Ambulatory Visit: Payer: Self-pay

## 2010-04-29 LAB — CBC
HCT: 33.6 % — ABNORMAL LOW (ref 36.0–46.0)
HCT: 34.3 % — ABNORMAL LOW (ref 36.0–46.0)
Hemoglobin: 11.5 g/dL — ABNORMAL LOW (ref 12.0–15.0)
MCH: 32.9 pg (ref 26.0–34.0)
MCV: 96 fL (ref 78.0–100.0)
MCV: 96.9 fL (ref 78.0–100.0)
RBC: 3.5 MIL/uL — ABNORMAL LOW (ref 3.87–5.11)
RDW: 13.4 % (ref 11.5–15.5)
WBC: 6.6 10*3/uL (ref 4.0–10.5)

## 2010-04-29 LAB — POCT URINALYSIS DIPSTICK
Glucose, UA: NEGATIVE mg/dL
Ketones, ur: NEGATIVE mg/dL
Nitrite: NEGATIVE
Protein, ur: 30 mg/dL — AB
Protein, ur: NEGATIVE mg/dL
Specific Gravity, Urine: 1.025 (ref 1.005–1.030)
Urobilinogen, UA: 0.2 mg/dL (ref 0.0–1.0)

## 2010-04-29 LAB — T-HELPER CELL (CD4) - (RCID CLINIC ONLY)
CD4 % Helper T Cell: 28 % — ABNORMAL LOW (ref 33–55)
CD4 T Cell Abs: 440 uL (ref 400–2700)

## 2010-04-30 ENCOUNTER — Encounter: Payer: Self-pay | Admitting: Adult Health

## 2010-04-30 LAB — POCT URINALYSIS DIPSTICK
Glucose, UA: 100 mg/dL — AB
Glucose, UA: NEGATIVE mg/dL
Hgb urine dipstick: NEGATIVE
Ketones, ur: NEGATIVE mg/dL
Nitrite: NEGATIVE
Nitrite: NEGATIVE
Nitrite: POSITIVE — AB
Protein, ur: NEGATIVE mg/dL
Urobilinogen, UA: 1 mg/dL (ref 0.0–1.0)
Urobilinogen, UA: 1 mg/dL (ref 0.0–1.0)
pH: 6 (ref 5.0–8.0)
pH: 6.5 (ref 5.0–8.0)

## 2010-04-30 LAB — GLUCOSE, FASTING GESTATIONAL: Glucose Tolerance, Fasting: 85 mg/dL

## 2010-04-30 LAB — GLUCOSE, 3 HOUR GESTATIONAL: Glucose, GTT - 3 Hour: 124 mg/dL (ref 70–144)

## 2010-05-01 LAB — URINALYSIS, ROUTINE W REFLEX MICROSCOPIC
Bilirubin Urine: NEGATIVE
Nitrite: NEGATIVE
Protein, ur: NEGATIVE mg/dL
Specific Gravity, Urine: 1.02 (ref 1.005–1.030)
Urobilinogen, UA: 1 mg/dL (ref 0.0–1.0)

## 2010-05-01 LAB — CBC
MCH: 35.6 pg — ABNORMAL HIGH (ref 26.0–34.0)
MCHC: 33.9 g/dL (ref 30.0–36.0)
MCV: 104.8 fL — ABNORMAL HIGH (ref 78.0–100.0)
Platelets: 157 10*3/uL (ref 150–400)
RDW: 14.2 % (ref 11.5–15.5)
WBC: 7.3 10*3/uL (ref 4.0–10.5)

## 2010-05-01 LAB — POCT URINALYSIS DIPSTICK
Glucose, UA: NEGATIVE mg/dL
Hgb urine dipstick: NEGATIVE
Nitrite: NEGATIVE
Protein, ur: 30 mg/dL — AB
Urobilinogen, UA: 1 mg/dL (ref 0.0–1.0)
pH: 7 (ref 5.0–8.0)

## 2010-05-02 LAB — POCT URINALYSIS DIPSTICK
Glucose, UA: 100 mg/dL — AB
Nitrite: NEGATIVE
Protein, ur: NEGATIVE mg/dL
Specific Gravity, Urine: 1.02 (ref 1.005–1.030)
Urobilinogen, UA: 1 mg/dL (ref 0.0–1.0)

## 2010-05-02 LAB — T-HELPER CELL (CD4) - (RCID CLINIC ONLY)
CD4 % Helper T Cell: 31 % — ABNORMAL LOW (ref 33–55)
CD4 T Cell Abs: 490 uL (ref 400–2700)

## 2010-05-03 LAB — POCT URINALYSIS DIPSTICK
Ketones, ur: NEGATIVE mg/dL
Nitrite: NEGATIVE
Protein, ur: NEGATIVE mg/dL
Urobilinogen, UA: 0.2 mg/dL (ref 0.0–1.0)

## 2010-05-04 LAB — POCT URINALYSIS DIP (DEVICE)
Glucose, UA: 500 mg/dL — AB
Nitrite: NEGATIVE
Urobilinogen, UA: 1 mg/dL (ref 0.0–1.0)

## 2010-05-05 LAB — POCT URINALYSIS DIP (DEVICE)
Bilirubin Urine: NEGATIVE
Bilirubin Urine: NEGATIVE
Glucose, UA: NEGATIVE mg/dL
Nitrite: NEGATIVE
Nitrite: NEGATIVE
Protein, ur: 30 mg/dL — AB
Urobilinogen, UA: 0.2 mg/dL (ref 0.0–1.0)

## 2010-05-06 LAB — T-HELPER CELL (CD4) - (RCID CLINIC ONLY): CD4 T Cell Abs: 700 uL (ref 400–2700)

## 2010-05-08 ENCOUNTER — Other Ambulatory Visit: Payer: Self-pay | Admitting: Adult Health

## 2010-05-08 DIAGNOSIS — B20 Human immunodeficiency virus [HIV] disease: Secondary | ICD-10-CM

## 2010-05-09 LAB — T-HELPER CELLS (CD4) COUNT (NOT AT ARMC)
Absolute CD4: 757 /uL (ref 381–1469)
CD4 T Helper %: 29 % — ABNORMAL LOW (ref 32–62)
Total Lymphocyte: 45 % (ref 12–46)
Total lymphocyte count: 2610 /uL (ref 700–3300)
WBC, lymph enumeration: 5.8 10*3/uL (ref 4.0–10.5)

## 2010-05-09 LAB — CBC WITH DIFFERENTIAL/PLATELET
Eosinophils Absolute: 0.1 10*3/uL (ref 0.0–0.7)
Hemoglobin: 13.7 g/dL (ref 12.0–15.0)
Lymphocytes Relative: 44 % (ref 12–46)
Lymphs Abs: 2.7 10*3/uL (ref 0.7–4.0)
MCH: 31.9 pg (ref 26.0–34.0)
Monocytes Relative: 11 % (ref 3–12)
Neutro Abs: 2.7 10*3/uL (ref 1.7–7.7)
Neutrophils Relative %: 44 % (ref 43–77)
Platelets: 209 10*3/uL (ref 150–400)
RBC: 4.3 MIL/uL (ref 3.87–5.11)
WBC: 6.1 10*3/uL (ref 4.0–10.5)

## 2010-05-09 LAB — COMPLETE METABOLIC PANEL WITH GFR
ALT: 19 U/L (ref 0–35)
BUN: 13 mg/dL (ref 6–23)
CO2: 30 mEq/L (ref 19–32)
Calcium: 9.3 mg/dL (ref 8.4–10.5)
Creat: 0.63 mg/dL (ref 0.40–1.20)
GFR, Est African American: >60 mL/min (ref >60)
Total Bilirubin: 0.3 mg/dL (ref 0.3–1.2)

## 2010-05-10 LAB — HIV-1 RNA QUANT-NO REFLEX-BLD
HIV 1 RNA Quant: <20 copies/mL (ref <20)
HIV-1 RNA Quant, Log: <1.3 {Log} (ref <1.30)

## 2010-05-10 NOTE — Progress Notes (Signed)
Courtney Chavez, AGOSTINELLI NO.:  [id number]  MEDICAL RECORD NO.:  [id number]           PATIENT TYPE:  A  LOCATION:  [facility]                   FACILITY:  [facility]  PHYSICIAN:  Maryelizabeth Kaufmann, MD  DATE OF BIRTH:  [birth year]  DATE OF SERVICE:  04/15/2010                                 CLINIC NOTE  CHIEF COMPLAINT:  Postpartum check.  HISTORY OF PRESENT ILLNESS:  This is a 35 year old gravida 3, para 3 status post spontaneous vaginal delivery after induction for HIV positive status on February 10, 2010.  She is currently bottle feeding. She denies any problems with constipation with the last bowel movement being yesterday.  She denies any problems with depression.  She has not had sexual intercourse, yet she desires IUD for birth control.  She has started her menses approximately 3 days ago with the last menstrual period on April 12, 2010.  She is HIV positive, taking her medications as directed.  She was seen in their clinic approximately 2 or 3 weeks ago.  She does have a history of chronic hypertension.  She was not on any medication during her pregnancy.  She was previously on hydrochlorothiazide, but that was discontinued when she was found out that she was pregnant and now her blood pressure is 152/109.  She is currently asymptomatic.  No headache, vision changes, right upper quadrant pain, or edema.  PHYSICAL EXAMINATION:  VITAL SIGNS:  Blood pressure 152/109, pulse 88, temperature 98.7, weight 232.6. GENERAL:  The patient is in no acute distress.  Alert and oriented x4. CHEST:  Clear to auscultation bilaterally.  No wheezing, rales, or rhonchi. CARDIOVASCULAR:  Regular rate and rhythm.  No murmurs, rubs, or gallops. ABDOMEN:  Positive bowel sounds.  Soft, nontender, and nondistended. GU:  Normal external genitalia with female circumcision present. Perineum appears to be intact.  She did not have any lacerations from her vaginal delivery that needed to be  repaired.  Vaginal mucosa appeared to be normal.  Cervix was notable for post LEEP changes and she did have some evidence of possible cervical repair.  However, her cervix appeared to be otherwise well healed and normal and pink.  Otherwise, no acute changes and no active bleeding were notable.  On bimanual exam, she did have the aforementioned changes that were present.  Otherwise, her uterus was mobile, anteverted.  Adnexa was nontender bilaterally.  ASSESSMENT AND PLAN:  This is a 36 year old, gravida 3, para 3, status post spontaneous vaginal delivery as of April 13, 2010. 1. Her Pap smear was up-to-date.  So the patient desires IUD     placement.  We will have the patient to apply for an IUD due to the     fact that she is 2 months postpartum, so we will have them to call     her once her IUD comes in. 2. Hypertension.  We will start the patient on     lisinopril/hydrochlorothiazide 20/25.  We will have her take 1     tablet p.o. daily.  We will have her to return to the clinic in 1     week for a blood pressure check and, if  her blood pressure is     normal, she will continue that aforementioned medication.          ______________________________ Maryelizabeth Kaufmann, MD    LC/MEDQ  D:  04/15/2010  T:  04/16/2010  Job:  [id number]

## 2010-05-13 ENCOUNTER — Ambulatory Visit: Payer: Self-pay | Admitting: Infectious Diseases

## 2010-05-22 ENCOUNTER — Encounter: Payer: Self-pay | Admitting: Infectious Diseases

## 2010-05-22 ENCOUNTER — Ambulatory Visit (INDEPENDENT_AMBULATORY_CARE_PROVIDER_SITE_OTHER): Payer: Self-pay | Admitting: Infectious Diseases

## 2010-05-22 DIAGNOSIS — B20 Human immunodeficiency virus [HIV] disease: Secondary | ICD-10-CM

## 2010-05-22 DIAGNOSIS — E785 Hyperlipidemia, unspecified: Secondary | ICD-10-CM

## 2010-05-22 DIAGNOSIS — I10 Essential (primary) hypertension: Secondary | ICD-10-CM

## 2010-05-22 NOTE — Assessment & Plan Note (Signed)
She id doing well back on her Atripla from her KLT/CBV since delivery.  She is given condoms today. Will check her labs in 4 months prior to visit.

## 2010-05-22 NOTE — Assessment & Plan Note (Signed)
Will continue to monitor this. 

## 2010-05-22 NOTE — Assessment & Plan Note (Signed)
Her HCTZ is restarted. Will see her back in 2 months for BP check.

## 2010-05-22 NOTE — Progress Notes (Signed)
  Subjective:    Patient ID: Courtney Chavez, female    DOB: 05/25/1975, 35 y.o.   MRN: [id number]  HPI 35 yo F with hx of HIV+ and HTN. She has been off antihtn medication since she delivered in February 10, 2010. Tests are negative and he is doing well.  Has some occas dizzyness. No headaches. Not breast feeding.    Review of Systems  Eyes: Negative for visual disturbance.  Respiratory: Negative for cough and shortness of breath.   Gastrointestinal: Negative for diarrhea and constipation.  Genitourinary: Negative for dysuria.   See HPI    Objective:   Physical Exam  Constitutional: She appears well-developed and well-nourished.  Eyes: EOM are normal. Pupils are equal, round, and reactive to light.  Neck: Neck supple.  Cardiovascular: Normal rate, regular rhythm and normal heart sounds.   Pulmonary/Chest: Effort normal and breath sounds normal.  Abdominal: Soft. Bowel sounds are normal. There is no tenderness.          Assessment & Plan:

## 2010-05-25 LAB — T-HELPER CELL (CD4) - (RCID CLINIC ONLY): CD4 % Helper T Cell: 27 % — ABNORMAL LOW (ref 33–55)

## 2010-06-20 ENCOUNTER — Ambulatory Visit: Payer: Self-pay

## 2010-06-20 ENCOUNTER — Ambulatory Visit: Payer: Self-pay | Admitting: Obstetrics and Gynecology

## 2010-06-25 ENCOUNTER — Ambulatory Visit: Payer: Self-pay

## 2010-07-05 NOTE — Op Note (Signed)
NAMEELVY, MCLARTY            ACCOUNT NO.:  [id number]   MEDICAL RECORD NO.:  [id number]          PATIENT TYPE:  OIB   LOCATION:  9319                          [facility]ILITY:  [facility]   PHYSICIAN:  Lesly Dukes, M.D. DATE OF BIRTH:  05/26/1975   DATE OF PROCEDURE:  05/25/2006  DATE OF DISCHARGE:                               OPERATIVE REPORT   PREOPERATIVE DIAGNOSIS:  A 36 year old female status post cold-knife  cone several days ago with hemorrhage from the cervix.   POSTOPERATIVE DIAGNOSIS:  A 34 year old female status post cold-knife  cone several days ago with hemorrhage from the cervix.   PROCEDURE:  Ligation of cervical bleed.   SURGEON:  Lesly Dukes, M.D.   ANESTHESIA:  General.   SPECIMENS:  None.   ESTIMATED BLOOD LOSS:  100 during the surgery.   COMPLICATIONS:  None.   PROCEDURE:  After informed consent was obtained, the patient was taken  to the operating room where general anesthesia was introduced. The  patient was placed in the dorsal lithotomy position, prepared and draped  in a normal sinus rhythm. The bladder was emptied. Speculum was placed  into the vagina, and the cervix was brought into view. The patient had  had a cold-knife cone, and a pursestring suture was used for hemostasis  until I could not see the bleeding area of the cervix; however, it was  not on the portio. A half a centimeter above the prior circumferential  pursestring suture I placed a 0 Vicryl suture which aided hemostasis. Of  note, the uterus seemed very boggy and looked like the cervix was  starting to slightly necrose. The cervical os sounded, after the  pursestring was applied, the uterus was patent. The patient tolerated  the procedure well. Sponge, lap, instrument and needle count correct x2,  and the patient went to the recovery room in stable condition.           ______________________________  Lesly Dukes, M.D.     KHL/MEDQ  D:  05/25/2006  T:  05/26/2006   Job:  [id number]

## 2010-07-05 NOTE — Group Therapy Note (Signed)
Courtney Chavez, WILAND NO.:  [id number]   MEDICAL RECORD NO.:  [id number]          PATIENT TYPE:  WOC   LOCATION:  [facility]                   FACILITY:  [facility]   PHYSICIAN:  Argentina Donovan, MD        DATE OF BIRTH:  [birth year]   DATE OF SERVICE:  04/08/2006                                  CLINIC NOTE   The patient is a 35 year old gravida 2, para 2-0-0-2, who recently  underwent colposcopy for an abnormal Pap smear that revealed high-grade  squamous epithelial lesions CIN-II with a high-grade endocervical lesion  CIN-II.  The cervix is extremely large and friable and I felt that the  risk of doing this in the clinic on the day I saw her far outweighed the  benefit.  I tried to control the bleeding with Epinephrine and Xylocaine  but she still bled at the slightest touch.  In addition she has a Mirena  IUD in place.  I, therefore, thought that LEEP excision biopsy in the  operating room would be much safer and easier.  So, I am scheduling her  for that.   ALLERGIES:  The patient has no known allergies.   MEDICATIONS:  She is on HIV medications.   We will schedule her for that.           ______________________________  Argentina Donovan, MD     PR/MEDQ  D:  04/08/2006  T:  04/08/2006  Job:  [id number]

## 2010-07-05 NOTE — Group Therapy Note (Signed)
NAMECORTNI, TAYS NO.:  [id number]   MEDICAL RECORD NO.:  [id number]          PATIENT TYPE:  WOC   LOCATION:  [facility]                   FACILITY:  [facility]   PHYSICIAN:  Dorthula Perfect, MD     DATE OF BIRTH:  08/05/1975   DATE OF SERVICE:  10/09/2005                                    CLINIC NOTE   A 35 year old black female, gravida 2, para 2, seen here in July for  colposcopy, which did not have to be performed.  All she needed was a repeat  Pap smear, which was normal.  She was desirous of an IUD and returns today  for same.   Her last menstrual period was the early part of July.  She has had sexual  intercourse but she says that condoms were used.  She had a negative  pregnancy test.   PHYSICAL EXAMINATION:  PELVIC:  External genitalia, BUS glands are normal.  Vaginal vault is epithelialized.  The cervix is normal.  The uterus was in  the midline and was felt to be normal size, although it did sound 9.5 cm.  Adnexal structures were normal.   IMPRESSION:  Contraception, intrauterine device.   DISPOSITION:  A Mirena IUD was inserted in aseptic manner without difficulty  after sounding the uterus to approximately 9.5 cm.  The string was left  approximately 3 cm long.           ______________________________  Dorthula Perfect, MD     ER/MEDQ  D:  10/09/2005  T:  10/10/2005  Job:  [id number]

## 2010-07-05 NOTE — Op Note (Signed)
Courtney Chavez, Courtney Chavez            ACCOUNT NO.:  [id number]   MEDICAL RECORD NO.:  [id number]          PATIENT TYPE:  AMB   LOCATION:  [facility]                           [facility]   PHYSICIAN:  Phil D. Okey Dupre, M.D.     DATE OF BIRTH:  [birth year]   DATE OF PROCEDURE:  05/18/2006  DATE OF DISCHARGE:                               OPERATIVE REPORT   PROCEDURE:  Removal of IUD and cold knife conization of the cervix.   PREOPERATIVE DIAGNOSIS:  Severe cervical dysplasia with endocervical  involvement and IUD in place.   POSTOPERATIVE DIAGNOSIS:  Severe cervical dysplasia with endocervical  involvement and IUD in place.   SURGEON:  Dr. Okey Dupre   ANESTHESIA:  General.   ESTIMATED BLOOD LOSS:  50 mL.   POSTOPERATIVE CONDITION:  Satisfactory.   SPECIMENS TO PATHOLOGY:  Conization biopsy of the cervix.   DESCRIPTION OF PROCEDURE:  Under satisfactory general anesthesia with  the patient in dorsal lithotomy position, perineum, vagina were prepped  and draped in the usual sterile manner.  Bimanual pelvic examination  revealed the uterus was anterior, freely movable and first degree  prolapse with normal adnexa.  Weighted speculum was placed posterior  fourchette of the vagina through a marital introitus. BUS was within  normal limits although she had a mild first-degree cystocele. Anterior  lip of the cervix was grasped with a single-tooth tenaculum.  As high up  on the cervix as possible approximately 3 cm from the distal end the  superficial pursestring was run around the entire circumference of the  uterus and tied anteriorly for hemostasis. One chromic suture was used  on an atraumatic needle. Angle sutures of figure-of-eight using the same  material were placed lateral at 9 and 3 o'clock for further hemostasis  control. Then a circular incision was made approximately 1 cm from the  external cervical os around the entire circumference of the exocervix.  The edges of this were grasped with  Allis clamps and angled cone was  then cut out using a scalpel and going up approximately 2.5 cm into the  cervix.  There was still a moderate amount of bleeding from the cervical  os and so a running locked baseball type suture was run around the  entire circumference of the cervix.  This seemed to control the bleeding  quite well.  This uterus was sounded to make sure that the os was still  open and the sound went in very easily. The IUD was removed before any  procedure was done on the cervix and that was removed quite easily.  There was a total blood loss of about 50 mL.  The patient tolerated the  procedure well.  Tenaculum and speculum removed from the vagina and the  patient was transferred to recovery room in satisfactory condition.          ______________________________  Javier Glazier Okey Dupre, M.D.    PDR/MEDQ  D:  05/18/2006  T:  05/18/2006  Job:  [id number]

## 2010-07-05 NOTE — Group Therapy Note (Signed)
Courtney Chavez, Courtney Chavez NO.:  [id number]   MEDICAL RECORD NO.:  [id number]          PATIENT TYPE:  WOC   LOCATION:  [facility]                   FACILITY:  [facility]   PHYSICIAN:  Dorthula Perfect, MD     DATE OF BIRTH:  [birth year]   DATE OF SERVICE:                                    CLINIC NOTE   CLINIC NOTE   DATE OF VISIT:  September 11, 2005.   SUBJECTIVE:  This 35 year old black female, gravida 2, para 2, now 7 weeks  postpartum presents for colposcopy.  She had a Pap smear back on April 01, 2005, during her pregnancy, which revealed CIN-1, positive HPV was  found.  The patient has not yet had her postpartum visit.  A previous Pap  smear, by her history, in 2006 was normal.  She is desirous of an IUD.   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  She  is followed by medical folks for this.   REVIEW OF SYSTEMS:  Normal good health, no complaints.   PHYSICAL EXAMINATION:  ABDOMEN:  Soft, nontender.  It is obese.  PELVIC:  External genitalia, BUS glands are normal.  Vaginal vault is  epithelized as was the cervix which appears normal.  The uterus is in the  midline, normal size, shape.  Adnexal structures are normal.   IMPRESSION:  1.  Normal postpartum examination.  2.  History of abnormal Pap smear.   DISPOSITION:  1.  Pap smear is done.  2.  The patient will not have colposcopy done today.  Because the abnormal      Pap smear was CIN-1 during her pregnancy, the best way to manage it is a      Pap smear at this time and then, if abnormal, a colposcopy.  If the Pap      smear is normal, however, it should be repeated again in six months.  3.  Because the patient is desirous of an IUD, she will return in four weeks      at which time the Pap smear report will be reviewed and in this interim      time an attempt will be made by the clinic to get funding for  the IUD.      She will use condoms in the interim.           ______________________________  Dorthula Perfect, MD    ER/MEDQ  D:  09/11/2005  T:  09/11/2005  Job:  [id number]

## 2010-08-26 ENCOUNTER — Other Ambulatory Visit: Payer: Self-pay

## 2010-09-09 ENCOUNTER — Ambulatory Visit: Payer: Self-pay

## 2010-09-09 ENCOUNTER — Ambulatory Visit: Payer: Self-pay | Admitting: Infectious Diseases

## 2010-09-09 ENCOUNTER — Other Ambulatory Visit: Payer: Self-pay

## 2010-09-09 DIAGNOSIS — B20 Human immunodeficiency virus [HIV] disease: Secondary | ICD-10-CM

## 2010-09-09 LAB — RPR

## 2010-09-09 LAB — COMPREHENSIVE METABOLIC PANEL
Albumin: 4.2 g/dL (ref 3.5–5.2)
BUN: 8 mg/dL (ref 6–23)
CO2: 27 mEq/L (ref 19–32)
Calcium: 9.1 mg/dL (ref 8.4–10.5)
Chloride: 100 mEq/L (ref 96–112)
Glucose, Bld: 109 mg/dL — ABNORMAL HIGH (ref 70–99)
Potassium: 4 mEq/L (ref 3.5–5.3)

## 2010-09-09 LAB — CBC
MCH: 30.3 pg (ref 26.0–34.0)
MCV: 92.9 fL (ref 78.0–100.0)
Platelets: 195 10*3/uL (ref 150–400)
RBC: 4.35 MIL/uL (ref 3.87–5.11)
RDW: 12.9 % (ref 11.5–15.5)
WBC: 4.8 10*3/uL (ref 4.0–10.5)

## 2010-09-10 ENCOUNTER — Other Ambulatory Visit: Payer: Self-pay

## 2010-09-10 LAB — HIV-1 RNA QUANT-NO REFLEX-BLD: HIV-1 RNA Quant, Log: <1.3 {Log} (ref <1.30)

## 2010-09-30 ENCOUNTER — Ambulatory Visit: Payer: Self-pay | Admitting: Infectious Diseases

## 2010-10-18 ENCOUNTER — Ambulatory Visit: Payer: Self-pay | Admitting: Infectious Diseases

## 2010-11-06 ENCOUNTER — Ambulatory Visit: Payer: Self-pay | Admitting: Infectious Diseases

## 2010-11-12 LAB — T-HELPER CELL (CD4) - (RCID CLINIC ONLY): CD4 % Helper T Cell: 26 — ABNORMAL LOW

## 2010-11-14 LAB — T-HELPER CELL (CD4) - (RCID CLINIC ONLY): CD4 T Cell Abs: 540

## 2010-11-18 ENCOUNTER — Encounter: Payer: Self-pay | Admitting: Infectious Diseases

## 2010-11-18 ENCOUNTER — Ambulatory Visit (INDEPENDENT_AMBULATORY_CARE_PROVIDER_SITE_OTHER): Payer: Self-pay | Admitting: Infectious Diseases

## 2010-11-18 VITALS — BP 159/117 | HR 77 | Temp 98.0°F | Ht 66.0 in | Wt 231.0 lb

## 2010-11-18 DIAGNOSIS — Z23 Encounter for immunization: Secondary | ICD-10-CM

## 2010-11-18 DIAGNOSIS — B20 Human immunodeficiency virus [HIV] disease: Secondary | ICD-10-CM

## 2010-11-18 DIAGNOSIS — O99210 Obesity complicating pregnancy, unspecified trimester: Secondary | ICD-10-CM | POA: Insufficient documentation

## 2010-11-18 DIAGNOSIS — E669 Obesity, unspecified: Secondary | ICD-10-CM

## 2010-11-18 DIAGNOSIS — I10 Essential (primary) hypertension: Secondary | ICD-10-CM

## 2010-11-18 LAB — T-HELPER CELL (CD4) - (RCID CLINIC ONLY)
CD4 % Helper T Cell: 27 — ABNORMAL LOW
CD4 T Cell Abs: 560

## 2010-11-18 MED ORDER — HYDROCHLOROTHIAZIDE 25 MG PO TABS: 25.0000 mg | ORAL_TABLET | Freq: Every day | ORAL | Status: DC

## 2010-11-18 MED ORDER — LISINOPRIL-HYDROCHLOROTHIAZIDE 10-12.5 MG PO TABS: 1.0000 | ORAL_TABLET | Freq: Every day | ORAL | Status: DC

## 2010-11-18 NOTE — Assessment & Plan Note (Addendum)
She is doing well. Needs pap. Offered condoms. She is given flu shot today. Will see her back in 5-6 months.

## 2010-11-18 NOTE — Assessment & Plan Note (Signed)
She is watching diet, exercising. Encouraged pt.

## 2010-11-18 NOTE — Progress Notes (Signed)
  Subjective:    Patient ID: Courtney Chavez, female    DOB: 1975-11-21, 35 y.o.   MRN: [id number]  HPI 35 yo F with hx of HIV+ and HTN. CD4 610, VL <20 (09-09-2010).  February 10, 2010. Her son's tests are negative and he is doing well. He is 38 months old now. No problems taking her atripla. Has an IUD now. Has been off anti-htn, is too expensive.    Review of Systems  Constitutional: Negative for activity change and unexpected weight change.  Cardiovascular: Negative for chest pain.  Gastrointestinal: Negative for diarrhea and constipation.  Genitourinary: Negative for dysuria.       Objective:   Physical Exam  Constitutional: She appears well-developed and well-nourished.  Eyes: EOM are normal. Pupils are equal, round, and reactive to light.  Neck: Neck supple.  Cardiovascular: Normal rate, regular rhythm and normal heart sounds.   Pulmonary/Chest: Effort normal and breath sounds normal.  Abdominal: Soft. Bowel sounds are normal. There is no tenderness.  Lymphadenopathy:    She has no cervical adenopathy.          Assessment & Plan:

## 2010-11-18 NOTE — Assessment & Plan Note (Addendum)
She has been unable to afford her HCTZ. Will give rx to pt and she will take to [facility]

## 2010-12-02 LAB — POCT PREGNANCY, URINE
Operator id: 130031
Preg Test, Ur: NEGATIVE

## 2010-12-02 LAB — T-HELPER CELL (CD4) - (RCID CLINIC ONLY)
CD4 % Helper T Cell: 25 — ABNORMAL LOW
CD4 T Cell Abs: 420

## 2010-12-13 ENCOUNTER — Ambulatory Visit: Payer: Self-pay

## 2011-03-20 ENCOUNTER — Encounter: Payer: Self-pay | Admitting: *Deleted

## 2011-04-28 ENCOUNTER — Other Ambulatory Visit: Payer: Self-pay

## 2011-04-30 ENCOUNTER — Other Ambulatory Visit (INDEPENDENT_AMBULATORY_CARE_PROVIDER_SITE_OTHER): Payer: Self-pay

## 2011-04-30 DIAGNOSIS — B20 Human immunodeficiency virus [HIV] disease: Secondary | ICD-10-CM

## 2011-04-30 DIAGNOSIS — Z113 Encounter for screening for infections with a predominantly sexual mode of transmission: Secondary | ICD-10-CM

## 2011-04-30 DIAGNOSIS — Z79899 Other long term (current) drug therapy: Secondary | ICD-10-CM

## 2011-04-30 LAB — COMPLETE METABOLIC PANEL WITH GFR
AST: 16 U/L (ref 0–37)
BUN: 8 mg/dL (ref 6–23)
Calcium: 9.3 mg/dL (ref 8.4–10.5)
Chloride: 100 mEq/L (ref 96–112)
Creat: 0.64 mg/dL (ref 0.50–1.10)
Total Bilirubin: 0.4 mg/dL (ref 0.3–1.2)

## 2011-04-30 LAB — LIPID PANEL
Cholesterol: 210 mg/dL — ABNORMAL HIGH (ref 0–200)
HDL: 45 mg/dL (ref >39)
Triglycerides: 89 mg/dL (ref <150)
VLDL: 18 mg/dL (ref 0–40)

## 2011-04-30 LAB — CBC
HCT: 39.4 % (ref 36.0–46.0)
MCH: 30.3 pg (ref 26.0–34.0)
MCHC: 33.8 g/dL (ref 30.0–36.0)
MCV: 89.7 fL (ref 78.0–100.0)
RDW: 13.1 % (ref 11.5–15.5)
WBC: 5.5 10*3/uL (ref 4.0–10.5)

## 2011-05-01 LAB — T-HELPER CELL (CD4) - (RCID CLINIC ONLY): CD4 % Helper T Cell: 29 % — ABNORMAL LOW (ref 33–55)

## 2011-05-12 ENCOUNTER — Ambulatory Visit: Payer: Self-pay | Admitting: Infectious Diseases

## 2011-05-15 ENCOUNTER — Ambulatory Visit: Payer: Self-pay | Admitting: Infectious Diseases

## 2011-05-29 ENCOUNTER — Other Ambulatory Visit: Payer: Self-pay | Admitting: *Deleted

## 2011-05-29 DIAGNOSIS — B20 Human immunodeficiency virus [HIV] disease: Secondary | ICD-10-CM

## 2011-05-29 DIAGNOSIS — I10 Essential (primary) hypertension: Secondary | ICD-10-CM

## 2011-05-29 MED ORDER — EFAVIRENZ-EMTRICITAB-TENOFOVIR 600-200-300 MG PO TABS: 1.0000 | ORAL_TABLET | Freq: Every day | ORAL | Status: DC

## 2011-05-29 NOTE — Telephone Encounter (Signed)
RN reviewed record for last PAP smear, 12/31/2007.  Phone call to pt to make appt for PAP smear.  Pt transferred to scheduler.  Appt made for 06/20/11.

## 2011-06-02 ENCOUNTER — Ambulatory Visit (INDEPENDENT_AMBULATORY_CARE_PROVIDER_SITE_OTHER): Payer: Self-pay | Admitting: Infectious Diseases

## 2011-06-02 ENCOUNTER — Encounter: Payer: Self-pay | Admitting: Infectious Diseases

## 2011-06-02 VITALS — BP 152/105 | HR 73 | Temp 98.7°F | Ht 65.0 in | Wt 233.0 lb

## 2011-06-02 DIAGNOSIS — B20 Human immunodeficiency virus [HIV] disease: Secondary | ICD-10-CM

## 2011-06-02 DIAGNOSIS — E785 Hyperlipidemia, unspecified: Secondary | ICD-10-CM

## 2011-06-02 DIAGNOSIS — I10 Essential (primary) hypertension: Secondary | ICD-10-CM

## 2011-06-02 NOTE — Assessment & Plan Note (Signed)
She is doing very well, could change atripla if she continues to have trouble with being "hyper". Her vax are up to date. She is offered/refuses condoms. She will make appt with Angelique Blonder for her PAP. rtc 6 months with labs prior.

## 2011-06-02 NOTE — Assessment & Plan Note (Signed)
Slight elevation of total Chol, hopefully not made worse by HCTZ. Encouraged her to diet and exercise.

## 2011-06-02 NOTE — Progress Notes (Signed)
  Subjective:    Patient ID: Courtney Chavez, female    DOB: 06-May-1975, 36 y.o.   MRN: [id number]  HPI 36 yo F with HIV+ and HTN. Did not take her HCTZ this AM. Is trying ot loose wt- started on and stopped exercising (wt came back). Son is doing well, 57.48 years old. Has 3 sons all (-). Has gotten an IUD. Atripla makes her "hyper" if she does not get 8 hours of sleep. No abn dreams.  HIV 1 RNA Quant (copies/mL)  Date Value  04/30/2011 <20   09/09/2010 <20   05/08/2010 <20      CD4 T Cell Abs (cmm)  Date Value  04/30/2011 610   09/09/2010 610   01/21/2010 440       Review of Systems  Constitutional: Negative for appetite change and unexpected weight change.  Cardiovascular: Negative for chest pain.  Gastrointestinal: Negative for diarrhea and constipation.  Genitourinary: Negative for dysuria.  Neurological: Negative for headaches.       Objective:   Physical Exam  Constitutional: She appears well-developed and well-nourished.  Eyes: EOM are normal. Pupils are equal, round, and reactive to light.  Neck: Neck supple.  Cardiovascular: Normal rate, regular rhythm and normal heart sounds.   Pulmonary/Chest: Effort normal and breath sounds normal.  Abdominal: Soft. Bowel sounds are normal. There is no tenderness.  Lymphadenopathy:    She has no cervical adenopathy.          Assessment & Plan:

## 2011-06-02 NOTE — Assessment & Plan Note (Signed)
Will continue HCTZ, encouraged her to be adherent. Encouraged her to diet and exercise.

## 2011-06-20 ENCOUNTER — Ambulatory Visit: Payer: Self-pay

## 2011-06-20 ENCOUNTER — Telehealth: Payer: Self-pay | Admitting: *Deleted

## 2011-06-20 NOTE — Telephone Encounter (Signed)
Message left for pt to call [facility] to make a new PAP smear appt.

## 2011-06-24 ENCOUNTER — Ambulatory Visit: Payer: Self-pay

## 2011-06-26 ENCOUNTER — Other Ambulatory Visit: Payer: Self-pay | Admitting: *Deleted

## 2011-06-26 ENCOUNTER — Ambulatory Visit: Payer: Self-pay

## 2011-06-26 DIAGNOSIS — B20 Human immunodeficiency virus [HIV] disease: Secondary | ICD-10-CM

## 2011-06-26 MED ORDER — EFAVIRENZ-EMTRICITAB-TENOFOVIR 600-200-300 MG PO TABS: 1.0000 | ORAL_TABLET | Freq: Every day | ORAL | Status: DC

## 2011-06-26 NOTE — Telephone Encounter (Signed)
Rx printed to go with the patient ADAP application.

## 2011-07-04 ENCOUNTER — Ambulatory Visit (INDEPENDENT_AMBULATORY_CARE_PROVIDER_SITE_OTHER): Payer: Self-pay | Admitting: *Deleted

## 2011-07-04 DIAGNOSIS — Z124 Encounter for screening for malignant neoplasm of cervix: Secondary | ICD-10-CM

## 2011-07-04 NOTE — Progress Notes (Signed)
  Subjective:     Courtney Chavez is a 36 y.o. woman who comes in today for a  pap smear  Contraception: Condoms, IUD.  Objective:    There were no vitals taken for this visit. LMP 06/02/11. Pelvic Exam:  Pap smear obtained.   Assessment:    Screening pap smear.   Plan:    Follow up in one year, or as indicated by Pap results. Pt given condoms. Pt given educational materials re: HIV and women, PAP smear, BSE, Mammograms, diet, nutrition, heart disease, exercise, self-esteem.

## 2011-07-04 NOTE — Patient Instructions (Signed)
  Your results will be ready in about a week.  I will mail them to you.  Thank you for coming to [facility].  Marguerite Jarboe 

## 2011-07-09 ENCOUNTER — Encounter: Payer: Self-pay | Admitting: *Deleted

## 2011-07-24 ENCOUNTER — Other Ambulatory Visit: Payer: Self-pay | Admitting: Obstetrics & Gynecology

## 2011-08-11 ENCOUNTER — Other Ambulatory Visit: Payer: Self-pay | Admitting: *Deleted

## 2011-08-11 DIAGNOSIS — I10 Essential (primary) hypertension: Secondary | ICD-10-CM

## 2011-08-11 MED ORDER — HYDROCHLOROTHIAZIDE 25 MG PO TABS: 25.0000 mg | ORAL_TABLET | Freq: Every day | ORAL | Status: DC

## 2011-08-11 NOTE — Telephone Encounter (Signed)
Verified med with pt. Sent electronically to [employer] [facility]

## 2011-11-12 ENCOUNTER — Other Ambulatory Visit: Payer: Self-pay

## 2011-11-12 ENCOUNTER — Ambulatory Visit: Payer: Self-pay

## 2011-11-12 DIAGNOSIS — B20 Human immunodeficiency virus [HIV] disease: Secondary | ICD-10-CM

## 2011-11-12 LAB — COMPREHENSIVE METABOLIC PANEL
ALT: 11 U/L (ref 0–35)
CO2: 29 mEq/L (ref 19–32)
Calcium: 9.2 mg/dL (ref 8.4–10.5)
Chloride: 101 mEq/L (ref 96–112)
Sodium: 139 mEq/L (ref 135–145)
Total Protein: 6.9 g/dL (ref 6.0–8.3)

## 2011-11-12 LAB — CBC
MCV: 86.7 fL (ref 78.0–100.0)
Platelets: 201 10*3/uL (ref 150–400)
RBC: 4.44 MIL/uL (ref 3.87–5.11)
WBC: 6.3 10*3/uL (ref 4.0–10.5)

## 2011-11-13 LAB — HIV-1 RNA QUANT-NO REFLEX-BLD: HIV 1 RNA Quant: <20 copies/mL (ref <20)

## 2011-11-13 LAB — T-HELPER CELL (CD4) - (RCID CLINIC ONLY): CD4 T Cell Abs: 910 uL (ref 400–2700)

## 2011-11-26 ENCOUNTER — Ambulatory Visit: Payer: Self-pay | Admitting: Infectious Diseases

## 2011-11-27 ENCOUNTER — Ambulatory Visit: Payer: Self-pay | Admitting: Infectious Diseases

## 2011-11-27 ENCOUNTER — Telehealth: Payer: Self-pay | Admitting: *Deleted

## 2011-11-27 NOTE — Telephone Encounter (Signed)
Called patient to reschedule appt, she no showed today.  Given appt for 12/22/11. [name]

## 2011-12-22 ENCOUNTER — Ambulatory Visit: Payer: Self-pay | Admitting: Infectious Diseases

## 2011-12-31 ENCOUNTER — Encounter: Payer: Self-pay | Admitting: Infectious Diseases

## 2011-12-31 ENCOUNTER — Ambulatory Visit (INDEPENDENT_AMBULATORY_CARE_PROVIDER_SITE_OTHER): Payer: Self-pay | Admitting: Infectious Diseases

## 2011-12-31 VITALS — BP 159/107 | HR 76 | Temp 97.7°F | Ht 64.0 in | Wt 238.0 lb

## 2011-12-31 DIAGNOSIS — B20 Human immunodeficiency virus [HIV] disease: Secondary | ICD-10-CM

## 2011-12-31 DIAGNOSIS — Z113 Encounter for screening for infections with a predominantly sexual mode of transmission: Secondary | ICD-10-CM

## 2011-12-31 DIAGNOSIS — Z79899 Other long term (current) drug therapy: Secondary | ICD-10-CM

## 2011-12-31 DIAGNOSIS — Z23 Encounter for immunization: Secondary | ICD-10-CM

## 2011-12-31 DIAGNOSIS — E669 Obesity, unspecified: Secondary | ICD-10-CM

## 2011-12-31 DIAGNOSIS — I10 Essential (primary) hypertension: Secondary | ICD-10-CM

## 2011-12-31 NOTE — Assessment & Plan Note (Signed)
Will change her Atripla to complera. Emphasized need to take with food. Will see her back in 3 months to asses adherence. Given condoms, flu shot, referral to dental (cracked molar).

## 2011-12-31 NOTE — Assessment & Plan Note (Signed)
Encouraged her to keep with her diet and exercise

## 2011-12-31 NOTE — Progress Notes (Signed)
  Subjective:    Patient ID: Courtney Chavez, female    DOB: 01-09-1976, 36 y.o.   MRN: [id number]  HPI 36 yo F with HIV+ and HTN. Has 3 sons all (-). Has gotten an IUD.  Atripla has been making her feel "high".  Taking anti-HTN occasionally, has been watching her diet, exercising occasionally.  Normal PAP May 2013.   HIV 1 RNA Quant (copies/mL)  Date Value  11/12/2011 <20   04/30/2011 <20   09/09/2010 <20      CD4 T Cell Abs (cmm)  Date Value  11/12/2011 910   04/30/2011 610   09/09/2010 610       Review of Systems  Constitutional: Negative for appetite change and unexpected weight change.  Gastrointestinal: Negative for diarrhea and constipation.  Genitourinary: Negative for difficulty urinating and menstrual problem.       Objective:   Physical Exam  Constitutional: She appears well-developed and well-nourished.  HENT:  Mouth/Throat: No oropharyngeal exudate.  Eyes: EOM are normal. Pupils are equal, round, and reactive to light.  Neck: Neck supple.  Cardiovascular: Normal rate, regular rhythm and normal heart sounds.   Pulmonary/Chest: Effort normal and breath sounds normal.  Abdominal: Soft. Bowel sounds are normal. There is no tenderness.  Lymphadenopathy:    She has no cervical adenopathy.          Assessment & Plan:

## 2011-12-31 NOTE — Assessment & Plan Note (Signed)
Encouraged her to take her anti-htn rx (taking only sporadically now). Cont to watch.Marland KitchenMarland KitchenMarland Kitchen

## 2012-03-31 ENCOUNTER — Other Ambulatory Visit: Payer: Self-pay

## 2012-04-12 ENCOUNTER — Ambulatory Visit (INDEPENDENT_AMBULATORY_CARE_PROVIDER_SITE_OTHER): Payer: Self-pay | Admitting: Infectious Diseases

## 2012-04-12 ENCOUNTER — Encounter: Payer: Self-pay | Admitting: Infectious Diseases

## 2012-04-12 VITALS — BP 169/126 | HR 85 | Temp 98.6°F | Ht 66.0 in | Wt 231.0 lb

## 2012-04-12 DIAGNOSIS — B20 Human immunodeficiency virus [HIV] disease: Secondary | ICD-10-CM

## 2012-04-12 DIAGNOSIS — E785 Hyperlipidemia, unspecified: Secondary | ICD-10-CM

## 2012-04-12 DIAGNOSIS — I10 Essential (primary) hypertension: Secondary | ICD-10-CM

## 2012-04-12 MED ORDER — EMTRICITAB-RILPIVIR-TENOFOV DF 200-25-300 MG PO TABS: 1.0000 | ORAL_TABLET | Freq: Every day | ORAL | Status: DC

## 2012-04-12 MED ORDER — HYDROCHLOROTHIAZIDE 25 MG PO TABS: 50.0000 mg | ORAL_TABLET | Freq: Every day | ORAL | Status: DC

## 2012-04-12 NOTE — Assessment & Plan Note (Signed)
Hopefully will improve some with changing to complera.  Will continue to monitor, get her into PCP Lab Results  Component Value Date   CHOL 210* 04/30/2011   HDL 45 04/30/2011   LDLCALC 147* 04/30/2011   TRIG 89 04/30/2011   CHOLHDL 4.7 04/30/2011

## 2012-04-12 NOTE — Progress Notes (Signed)
  Subjective:    Patient ID: Courtney Chavez, female    DOB: 1975/04/30, 37 y.o.   MRN: [id number]  HPI 37 yo F with HIV+ and HTN. Has 3 sons all (-). Has gotten an IUD. Taking atripla. Wants to change her ART. Wants to have 1 more baby.  Has concerns about her BP- has not had dizziness or CP. No headaches. Still exercising.   HIV 1 RNA Quant (copies/mL)  Date Value  11/12/2011 <20   04/30/2011 <20   09/09/2010 <20      CD4 T Cell Abs (cmm)  Date Value  11/12/2011 910   04/30/2011 610   09/09/2010 610       Review of Systems  Constitutional: Negative for appetite change and unexpected weight change.  Cardiovascular: Negative for chest pain.  Gastrointestinal: Negative for diarrhea and constipation.  Genitourinary: Negative for dysuria.  Neurological: Negative for dizziness and headaches.       Objective:   Physical Exam  Constitutional: She appears well-developed and well-nourished.  Eyes: EOM are normal. Pupils are equal, round, and reactive to light.  Neck: Neck supple.  Cardiovascular: Normal rate, regular rhythm and normal heart sounds.   Pulmonary/Chest: Effort normal and breath sounds normal.  Abdominal: Soft. Bowel sounds are normal. There is no tenderness.  Lymphadenopathy:    She has no cervical adenopathy.          Assessment & Plan:

## 2012-04-12 NOTE — Assessment & Plan Note (Addendum)
Increase her HCTZ, avoid ACE-I with possible pregnancy. Get her into PCP

## 2012-04-12 NOTE — Assessment & Plan Note (Signed)
Will change her to complera, told her to take with food. Offered/refused condoms. Will get her into dental clinic. Recheck her Hep B SAb (previous no response?). See her back in 3-4 months.

## 2012-04-14 ENCOUNTER — Ambulatory Visit: Payer: Self-pay | Admitting: Infectious Diseases

## 2012-04-29 ENCOUNTER — Ambulatory Visit: Payer: Self-pay

## 2012-05-05 ENCOUNTER — Ambulatory Visit: Payer: Self-pay

## 2012-05-12 ENCOUNTER — Ambulatory Visit: Payer: Self-pay

## 2012-05-26 ENCOUNTER — Ambulatory Visit: Payer: Self-pay

## 2012-06-17 ENCOUNTER — Other Ambulatory Visit: Payer: Self-pay | Admitting: *Deleted

## 2012-06-17 DIAGNOSIS — B20 Human immunodeficiency virus [HIV] disease: Secondary | ICD-10-CM

## 2012-06-17 MED ORDER — EMTRICITAB-RILPIVIR-TENOFOV DF 200-25-300 MG PO TABS: 1.0000 | ORAL_TABLET | Freq: Every day | ORAL | Status: DC

## 2012-07-05 ENCOUNTER — Ambulatory Visit: Payer: Self-pay

## 2012-07-13 ENCOUNTER — Other Ambulatory Visit: Payer: Self-pay | Admitting: Internal Medicine

## 2012-07-13 ENCOUNTER — Other Ambulatory Visit: Payer: Self-pay

## 2012-07-13 DIAGNOSIS — Z79899 Other long term (current) drug therapy: Secondary | ICD-10-CM

## 2012-07-13 DIAGNOSIS — Z113 Encounter for screening for infections with a predominantly sexual mode of transmission: Secondary | ICD-10-CM

## 2012-07-13 DIAGNOSIS — B20 Human immunodeficiency virus [HIV] disease: Secondary | ICD-10-CM

## 2012-07-13 LAB — LIPID PANEL
HDL: 41 mg/dL (ref >39)
LDL Cholesterol: 141 mg/dL — ABNORMAL HIGH (ref 0–99)

## 2012-07-13 LAB — CBC
HCT: 37.1 % (ref 36.0–46.0)
MCHC: 34 g/dL (ref 30.0–36.0)
MCV: 87.5 fL (ref 78.0–100.0)
RDW: 13.6 % (ref 11.5–15.5)

## 2012-07-13 LAB — COMPLETE METABOLIC PANEL WITH GFR
ALT: 14 U/L (ref 0–35)
CO2: 27 mEq/L (ref 19–32)
Chloride: 103 mEq/L (ref 96–112)
GFR, Est African American: >89 mL/min
Sodium: 138 mEq/L (ref 135–145)
Total Bilirubin: 0.4 mg/dL (ref 0.3–1.2)
Total Protein: 6.7 g/dL (ref 6.0–8.3)

## 2012-07-14 LAB — T-HELPER CELL (CD4) - (RCID CLINIC ONLY): CD4 T Cell Abs: 640 uL (ref 400–2700)

## 2012-07-28 ENCOUNTER — Ambulatory Visit: Payer: Self-pay | Admitting: Infectious Diseases

## 2012-08-02 ENCOUNTER — Ambulatory Visit (INDEPENDENT_AMBULATORY_CARE_PROVIDER_SITE_OTHER): Payer: Self-pay | Admitting: *Deleted

## 2012-08-02 DIAGNOSIS — Z124 Encounter for screening for malignant neoplasm of cervix: Secondary | ICD-10-CM

## 2012-08-02 DIAGNOSIS — Z113 Encounter for screening for infections with a predominantly sexual mode of transmission: Secondary | ICD-10-CM

## 2012-08-02 NOTE — Progress Notes (Signed)
  Subjective:     Courtney Chavez is a 37 y.o. woman who comes in today for a  pap smear only.  Previous abnormal Pap:no. Contraception: IUD, condoms.  Pt reported "vaginal infection" 3 weeks ago which she self-treated with an "antibiotic" she was given while she was pregnant.  Patient reported that "the symptoms went away."  Pelvic Exam: Pap smear obtained.   Assessment:    Screening pap smear.  Yellow/green discharge present in vagina.  Obtained specimen.  Plan:    Follow up in year, or as indicated by Pap results.  Patient given educational materials re: BSE, nutrition, PAP smears, diet, exercise, and self-esteem.

## 2012-08-02 NOTE — Patient Instructions (Signed)
Your PAP smear results will be available on MyChart the beginning of next week.  Thank you for coming to the Center for your care.  Angelique Blonder, RN

## 2012-08-03 ENCOUNTER — Ambulatory Visit (INDEPENDENT_AMBULATORY_CARE_PROVIDER_SITE_OTHER): Payer: Self-pay | Admitting: Infectious Diseases

## 2012-08-03 ENCOUNTER — Encounter: Payer: Self-pay | Admitting: Infectious Diseases

## 2012-08-03 VITALS — BP 154/104 | HR 77 | Temp 98.8°F | Ht 64.0 in | Wt 238.0 lb

## 2012-08-03 DIAGNOSIS — E785 Hyperlipidemia, unspecified: Secondary | ICD-10-CM

## 2012-08-03 DIAGNOSIS — I10 Essential (primary) hypertension: Secondary | ICD-10-CM

## 2012-08-03 DIAGNOSIS — E669 Obesity, unspecified: Secondary | ICD-10-CM

## 2012-08-03 DIAGNOSIS — B20 Human immunodeficiency virus [HIV] disease: Secondary | ICD-10-CM

## 2012-08-03 MED ORDER — HYDROCHLOROTHIAZIDE 50 MG PO TABS: 50.0000 mg | ORAL_TABLET | Freq: Every day | ORAL | Status: DC

## 2012-08-03 NOTE — Assessment & Plan Note (Signed)
Lab Results  Component Value Date   CHOL 200 07/13/2012   HDL 41 07/13/2012   LDLCALC 141* 07/13/2012   TRIG 88 07/13/2012   CHOLHDL 4.9 07/13/2012    Will continue to watch. Mild at last draw.

## 2012-08-03 NOTE — Progress Notes (Signed)
  Subjective:    Patient ID: Courtney Chavez, female    DOB: [birth year], 37 y.o.   MRN: [id number]  HPI  37 yo F from [place] came to [place] 2001. HIV+ and HTN. Has 3 sons all (-). Has gotten an IUD. She was to be changed to complera at her previous visit (done in computer) but pharmacy did not change the rx.  No problems with atripla. Has been trying to loose wt- has lost 10#.  Had PAP yesterday.  HIV 1 RNA Quant (copies/mL)  Date Value  07/13/2012 3045*  11/12/2011 <20   04/30/2011 <20      CD4 T Cell Abs (cmm)  Date Value  07/13/2012 640   11/12/2011 910   04/30/2011 610     Review of Systems  Constitutional: Negative for appetite change and unexpected weight change.  Cardiovascular: Negative for chest pain.  Gastrointestinal: Negative for diarrhea and constipation.  Genitourinary: Negative for difficulty urinating.  Neurological: Negative for headaches.       Objective:   Physical Exam  Constitutional: She appears well-developed and well-nourished.  HENT:  Mouth/Throat: No oropharyngeal exudate.  Eyes: EOM are normal. Pupils are equal, round, and reactive to light.  Neck: Neck supple.  Cardiovascular: Normal rate, regular rhythm and normal heart sounds.   Pulmonary/Chest: Effort normal and breath sounds normal.  Abdominal: Soft. Bowel sounds are normal. There is no tenderness.  Lymphadenopathy:    She has no cervical adenopathy.          Assessment & Plan:

## 2012-08-03 NOTE — Assessment & Plan Note (Signed)
Pt loosing wt with diet and exercise.

## 2012-08-03 NOTE — Assessment & Plan Note (Signed)
She has detectable virus. Will check her genotype and INI sensitivity as well HLA. See her back in 1 month. She is given condoms. Get mammogram.

## 2012-08-03 NOTE — Assessment & Plan Note (Signed)
Will change her HCTZ to 50mg  daily.

## 2012-08-04 LAB — HIV-1 RNA ULTRAQUANT REFLEX TO GENTYP+
HIV 1 RNA Quant: 10041 copies/mL — ABNORMAL HIGH (ref <20)
HIV-1 RNA Quant, Log: 4 {Log} — ABNORMAL HIGH (ref <1.30)

## 2012-08-05 ENCOUNTER — Other Ambulatory Visit: Payer: Self-pay | Admitting: Internal Medicine

## 2012-08-05 MED ORDER — METRONIDAZOLE 500 MG PO TABS: 2000.0000 mg | ORAL_TABLET | Freq: Once | ORAL | Status: DC

## 2012-08-09 ENCOUNTER — Ambulatory Visit: Payer: Self-pay

## 2012-08-09 NOTE — Progress Notes (Signed)
  RN spoke with the pt about treatment for vaginal infection.  Pt to pick up medication today.  RN advised pt to tell her sexual partner to obtain evaluation and treatment too.  Pt verbalized understanding and stated that she would contact her partner.

## 2012-08-10 LAB — HIV-1 INTEGRASE GENOTYPE

## 2012-08-11 LAB — HLA B*5701: HLA-B*5701: NEGATIVE

## 2012-09-08 ENCOUNTER — Ambulatory Visit (INDEPENDENT_AMBULATORY_CARE_PROVIDER_SITE_OTHER): Payer: Self-pay | Admitting: Infectious Diseases

## 2012-09-08 ENCOUNTER — Encounter: Payer: Self-pay | Admitting: Infectious Diseases

## 2012-09-08 VITALS — BP 131/84 | HR 76 | Temp 98.3°F | Ht 66.0 in | Wt 236.0 lb

## 2012-09-08 DIAGNOSIS — B20 Human immunodeficiency virus [HIV] disease: Secondary | ICD-10-CM

## 2012-09-08 DIAGNOSIS — I10 Essential (primary) hypertension: Secondary | ICD-10-CM

## 2012-09-08 LAB — BASIC METABOLIC PANEL
BUN: 13 mg/dL (ref 6–23)
CO2: 28 mEq/L (ref 19–32)
Chloride: 100 mEq/L (ref 96–112)
Creat: 0.76 mg/dL (ref 0.50–1.10)
Glucose, Bld: 123 mg/dL — ABNORMAL HIGH (ref 70–99)

## 2012-09-08 NOTE — Assessment & Plan Note (Addendum)
Is taking complera well, given her geno, this should be working. Will recheck her VL now, as long as she is doing well will see her back in 4-5 months. She is offered/refuses condoms.

## 2012-09-08 NOTE — Progress Notes (Signed)
  Subjective:    Patient ID: Courtney Chavez, female    DOB: [birth year], 37 y.o.   MRN: [id number]  HPI 37 yo F from [place] came to [place] 2001. HIV+ and HTN. Has 3 sons all (-). Has gotten an IUD.  At previous visit in June 2014 she had detectable virus- EFV/NVP resistance (was skipping meds felt like she was "on drugs".). Has since been changed to complera. Has been "better" Has been having knee pain. Has taken no OTC rx.  BP is much better today (prev visit HCTZ was increased). Continues to watch diet, exercise.    Review of Systems     Objective:   Physical Exam  Constitutional: She appears well-developed and well-nourished.  HENT:  Mouth/Throat: No oropharyngeal exudate.  Eyes: EOM are normal. Pupils are equal, round, and reactive to light.  Neck: Neck supple.  Cardiovascular: Normal rate, regular rhythm and normal heart sounds.   Pulmonary/Chest: Effort normal and breath sounds normal.  Abdominal: Soft. Bowel sounds are normal. There is no tenderness. There is no rebound.  Lymphadenopathy:    She has no cervical adenopathy.          Assessment & Plan:

## 2012-09-08 NOTE — Assessment & Plan Note (Signed)
Doing well/better. Will check BMP with change in HCTZ.

## 2012-10-27 ENCOUNTER — Ambulatory Visit: Payer: Self-pay

## 2012-12-07 ENCOUNTER — Other Ambulatory Visit: Payer: Self-pay | Admitting: *Deleted

## 2012-12-07 DIAGNOSIS — B20 Human immunodeficiency virus [HIV] disease: Secondary | ICD-10-CM

## 2012-12-07 MED ORDER — EMTRICITAB-RILPIVIR-TENOFOV DF 200-25-300 MG PO TABS: 1.0000 | ORAL_TABLET | Freq: Every day | ORAL | Status: DC

## 2012-12-23 ENCOUNTER — Other Ambulatory Visit: Payer: Self-pay

## 2013-01-10 ENCOUNTER — Other Ambulatory Visit: Payer: Self-pay

## 2013-01-12 ENCOUNTER — Other Ambulatory Visit: Payer: Self-pay

## 2013-01-17 ENCOUNTER — Other Ambulatory Visit (INDEPENDENT_AMBULATORY_CARE_PROVIDER_SITE_OTHER): Payer: Self-pay

## 2013-01-17 DIAGNOSIS — Z79899 Other long term (current) drug therapy: Secondary | ICD-10-CM

## 2013-01-17 DIAGNOSIS — B20 Human immunodeficiency virus [HIV] disease: Secondary | ICD-10-CM

## 2013-01-17 LAB — COMPLETE METABOLIC PANEL WITH GFR
ALT: 13 U/L (ref 0–35)
AST: 14 U/L (ref 0–37)
Creat: 0.64 mg/dL (ref 0.50–1.10)
GFR, Est African American: >89 mL/min
Total Bilirubin: 0.4 mg/dL (ref 0.3–1.2)

## 2013-01-17 LAB — CBC
HCT: 38 % (ref 36.0–46.0)
MCHC: 33.7 g/dL (ref 30.0–36.0)
MCV: 89.2 fL (ref 78.0–100.0)
Platelets: 204 10*3/uL (ref 150–400)
RDW: 12.9 % (ref 11.5–15.5)
WBC: 4.8 10*3/uL (ref 4.0–10.5)

## 2013-01-17 LAB — LIPID PANEL
HDL: 43 mg/dL (ref >39)
LDL Cholesterol: 141 mg/dL — ABNORMAL HIGH (ref 0–99)
Triglycerides: 70 mg/dL (ref <150)
VLDL: 14 mg/dL (ref 0–40)

## 2013-01-18 LAB — T-HELPER CELL (CD4) - (RCID CLINIC ONLY): CD4 T Cell Abs: 700 /uL (ref 400–2700)

## 2013-01-26 ENCOUNTER — Telehealth: Payer: Self-pay | Admitting: *Deleted

## 2013-01-26 ENCOUNTER — Ambulatory Visit: Payer: Self-pay | Admitting: Infectious Diseases

## 2013-01-26 NOTE — Telephone Encounter (Signed)
Called patient to notify her of missed appointment.  Patient stated that Monday mornings work best for her d/t her work schedule.  Patient came for lab work 2 weeks ago, reports no issue with taking medication.  Pt scheduled for 1/12.  Pt informed of our no-show policy, stated she understood. Andree Coss, RN

## 2013-02-28 ENCOUNTER — Ambulatory Visit: Payer: Self-pay

## 2013-02-28 ENCOUNTER — Other Ambulatory Visit: Payer: Self-pay | Admitting: *Deleted

## 2013-02-28 ENCOUNTER — Ambulatory Visit (INDEPENDENT_AMBULATORY_CARE_PROVIDER_SITE_OTHER): Payer: Self-pay | Admitting: Infectious Diseases

## 2013-02-28 ENCOUNTER — Encounter: Payer: Self-pay | Admitting: Infectious Diseases

## 2013-02-28 VITALS — BP 151/97 | HR 72 | Temp 98.5°F | Ht 65.0 in | Wt 240.0 lb

## 2013-02-28 DIAGNOSIS — B20 Human immunodeficiency virus [HIV] disease: Secondary | ICD-10-CM

## 2013-02-28 DIAGNOSIS — I10 Essential (primary) hypertension: Secondary | ICD-10-CM

## 2013-02-28 DIAGNOSIS — Z23 Encounter for immunization: Secondary | ICD-10-CM

## 2013-02-28 DIAGNOSIS — E785 Hyperlipidemia, unspecified: Secondary | ICD-10-CM

## 2013-02-28 DIAGNOSIS — E669 Obesity, unspecified: Secondary | ICD-10-CM

## 2013-02-28 DIAGNOSIS — Z113 Encounter for screening for infections with a predominantly sexual mode of transmission: Secondary | ICD-10-CM

## 2013-02-28 DIAGNOSIS — Z79899 Other long term (current) drug therapy: Secondary | ICD-10-CM

## 2013-02-28 MED ORDER — EMTRICITAB-RILPIVIR-TENOFOV DF 200-25-300 MG PO TABS: 1.0000 | ORAL_TABLET | Freq: Every day | ORAL | Status: DC

## 2013-02-28 NOTE — Assessment & Plan Note (Signed)
She is upset that her wt is up 10#. Will have her seen by dietician. Encouraged diet and exercise.

## 2013-02-28 NOTE — Assessment & Plan Note (Signed)
Encouraged her to take her anti-htn rx

## 2013-02-28 NOTE — Assessment & Plan Note (Signed)
She is doing well. Gets flu shot today. She is taking her art well. Will set her up for PAP. Will recheck her Hep B S Ab. She needs dental as well. rtc 6 months.

## 2013-02-28 NOTE — Progress Notes (Signed)
   Subjective:    Patient ID: Courtney Chavez, female    DOB: [birth year], 38 y.o.   MRN: [id number]  HPI 38 yo F from [place] came to [place] 2001. HIV+ and HTN.  Has gotten an IUD. At previous visit in June 2014 she had detectable virus- EFV/NVP resistance (was skipping meds felt like she was "on drugs".). Has since been changed to complera.   HIV 1 RNA Quant (copies/mL)  Date Value  01/17/2013 <20   09/08/2012 69*  08/03/2012 10041*     CD4 T Cell Abs (/uL)  Date Value  01/17/2013 700   07/13/2012 640   11/12/2011 910    Today BP up, missed BP rx today. Concerned about wt gain- exercises ocassionally. Has been trying to cut down on eating starch. No problems with complera.  Nl PAP in June 2014. Has light periods due to IUD.   Review of Systems  Constitutional: Negative for appetite change and unexpected weight change.  Cardiovascular: Negative for chest pain.  Gastrointestinal: Negative for diarrhea and constipation.  Genitourinary: Negative for difficulty urinating and menstrual problem.  Neurological: Negative for headaches.       Objective:   Physical Exam  Constitutional: She appears well-developed and well-nourished.  HENT:  Mouth/Throat: No oropharyngeal exudate.  Eyes: EOM are normal. Pupils are equal, round, and reactive to light.  Neck: Neck supple.  Cardiovascular: Normal rate, regular rhythm and normal heart sounds.   Pulmonary/Chest: Effort normal and breath sounds normal.  Abdominal: Soft. Bowel sounds are normal. She exhibits no distension. There is no tenderness.  Lymphadenopathy:    She has no cervical adenopathy.          Assessment & Plan:

## 2013-02-28 NOTE — Assessment & Plan Note (Signed)
Lab Results  Component Value Date   CHOL 198 01/17/2013   HDL 43 01/17/2013   LDLCALC 141* 01/17/2013   TRIG 70 01/17/2013   CHOLHDL 4.6 01/17/2013    Lipids improved. Will continue to watch. Encourage diet and exercise.

## 2013-03-21 ENCOUNTER — Ambulatory Visit: Payer: Self-pay

## 2013-04-04 ENCOUNTER — Other Ambulatory Visit: Payer: Self-pay | Admitting: *Deleted

## 2013-04-04 DIAGNOSIS — I10 Essential (primary) hypertension: Secondary | ICD-10-CM

## 2013-04-04 DIAGNOSIS — B20 Human immunodeficiency virus [HIV] disease: Secondary | ICD-10-CM

## 2013-04-04 MED ORDER — HYDROCHLOROTHIAZIDE 50 MG PO TABS: 50.0000 mg | ORAL_TABLET | Freq: Every day | ORAL | Status: DC

## 2013-04-04 MED ORDER — EMTRICITAB-RILPIVIR-TENOFOV DF 200-25-300 MG PO TABS: 1.0000 | ORAL_TABLET | Freq: Every day | ORAL | Status: DC

## 2013-04-18 ENCOUNTER — Ambulatory Visit: Payer: Self-pay

## 2013-04-18 VITALS — Ht 65.0 in | Wt 237.5 lb

## 2013-04-18 DIAGNOSIS — E669 Obesity, unspecified: Secondary | ICD-10-CM

## 2013-04-18 NOTE — Progress Notes (Signed)
Medical Nutrition Therapy:  Appt start time: 1100 end time:  5176.  Assessment:  Patient's primary concern today, weight loss and healthy eating. Patient reports that she maintains her weight around 235 pounds, closely following it to prevent weight gain. However, she would like to lose about 70 pounds and eat healthier. In general, intake is healthy, but portion size is main issue, particularly with rice. She has recently stopped drinking late at night and drinks mostly water. She does a lot of walking in the summer, but has not been exercising regularly. 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 She also has 3 sons, and is busy cooking/cleaning after work.   MEDICATIONS: See list   DIETARY INTAKE:   Usual eating pattern includes 2-3 meals and 1-2 snacks per day.  24-hr recall:  B ( AM): Usually skips or has a meal replacement shake  Snk ( AM): Graham crackers and peanut butter  L ( PM): Rice (2 cups or more), chicken/turkey, vegetables OR hot dog OR spaghetti Snk ( PM): Fruit D ( PM): Same as lunch Snk ( PM): None, does not eat after dinner Beverages: Water  Usual physical activity: None, active job, on feet at home after work  Estimated energy needs: 1600 calories 200 g carbohydrates 100 g protein 44 g fat  Progress Towards Goal(s):  In progress.   Nutritional Diagnosis:  [place]-3.3 Overweight/obesity As related to excessive portion size.  As evidenced by BMI >30.    Intervention:  Nutrition counseling.We discussed strategies for weight loss, including balancing nutrients (carbs, protein, fat), portion control, healthy snacks, and exercise.   Goals:  1. 1 pound weight loss per week. Short term goal weight: 220 pounds 2. Use plate method to balance meals, decreasing portions of rice and increasing portions of non-starchy vegetables.  3. Eat 3 meals daily.  4. Choose healthy snacks (fruit, graham crackers/PB, light popcorn)  Handouts given  during visit include:  Weight loss tips   Meal plan card  Monitoring/Evaluation:  Dietary intake, exercise, and body weight prn.

## 2013-11-17 ENCOUNTER — Telehealth: Payer: Self-pay | Admitting: *Deleted

## 2013-11-17 NOTE — Telephone Encounter (Signed)
Made lab work appt for 11/21/13.  Needs to see counselor about RW and ADAP.  Needs to make MD and PAP smear appts too.

## 2013-11-21 ENCOUNTER — Other Ambulatory Visit (INDEPENDENT_AMBULATORY_CARE_PROVIDER_SITE_OTHER): Payer: Self-pay

## 2013-11-21 DIAGNOSIS — Z79899 Other long term (current) drug therapy: Secondary | ICD-10-CM

## 2013-11-21 DIAGNOSIS — B20 Human immunodeficiency virus [HIV] disease: Secondary | ICD-10-CM

## 2013-11-21 DIAGNOSIS — Z113 Encounter for screening for infections with a predominantly sexual mode of transmission: Secondary | ICD-10-CM

## 2013-11-21 LAB — CBC WITH DIFFERENTIAL/PLATELET
BASOS ABS: 0 10*3/uL (ref 0.0–0.1)
Basophils Relative: 0 % (ref 0–1)
Eosinophils Absolute: 0 10*3/uL (ref 0.0–0.7)
Eosinophils Relative: 0 % (ref 0–5)
HEMATOCRIT: 39.5 % (ref 36.0–46.0)
Hemoglobin: 13.4 g/dL (ref 12.0–15.0)
LYMPHS PCT: 43 % (ref 12–46)
Lymphs Abs: 2.2 10*3/uL (ref 0.7–4.0)
MCH: 29.8 pg (ref 26.0–34.0)
MCHC: 33.9 g/dL (ref 30.0–36.0)
MCV: 88 fL (ref 78.0–100.0)
MONO ABS: 0.6 10*3/uL (ref 0.1–1.0)
Monocytes Relative: 12 % (ref 3–12)
NEUTROS ABS: 2.3 10*3/uL (ref 1.7–7.7)
Neutrophils Relative %: 45 % (ref 43–77)
PLATELETS: 206 10*3/uL (ref 150–400)
RBC: 4.49 MIL/uL (ref 3.87–5.11)
RDW: 13.5 % (ref 11.5–15.5)
WBC: 5.1 10*3/uL (ref 4.0–10.5)

## 2013-11-21 LAB — COMPLETE METABOLIC PANEL WITH GFR
ALBUMIN: 4.2 g/dL (ref 3.5–5.2)
ALT: 14 U/L (ref 0–35)
AST: 15 U/L (ref 0–37)
Alkaline Phosphatase: 36 U/L — ABNORMAL LOW (ref 39–117)
BUN: 9 mg/dL (ref 6–23)
CHLORIDE: 100 meq/L (ref 96–112)
CO2: 30 mEq/L (ref 19–32)
Calcium: 9.5 mg/dL (ref 8.4–10.5)
Creat: 0.74 mg/dL (ref 0.50–1.10)
GFR, Est African American: >89 mL/min
GFR, Est Non African American: >89 mL/min
Glucose, Bld: 104 mg/dL — ABNORMAL HIGH (ref 70–99)
POTASSIUM: 3.8 meq/L (ref 3.5–5.3)
SODIUM: 137 meq/L (ref 135–145)
TOTAL PROTEIN: 7.3 g/dL (ref 6.0–8.3)
Total Bilirubin: 0.5 mg/dL (ref 0.2–1.2)

## 2013-11-21 LAB — LIPID PANEL
CHOL/HDL RATIO: 4.7 ratio
Cholesterol: 203 mg/dL — ABNORMAL HIGH (ref 0–200)
HDL: 43 mg/dL (ref >39)
LDL CALC: 148 mg/dL — AB (ref 0–99)
Triglycerides: 62 mg/dL (ref <150)
VLDL: 12 mg/dL (ref 0–40)

## 2013-11-21 LAB — HEPATITIS B SURFACE ANTIBODY,QUALITATIVE: Hep B S Ab: NEGATIVE

## 2013-11-21 LAB — RPR

## 2013-11-22 LAB — URINE CYTOLOGY ANCILLARY ONLY
Chlamydia: NEGATIVE
Neisseria Gonorrhea: NEGATIVE

## 2013-11-22 LAB — T-HELPER CELL (CD4) - (RCID CLINIC ONLY)
CD4 % Helper T Cell: 29 % — ABNORMAL LOW (ref 33–55)
CD4 T Cell Abs: 640 /uL (ref 400–2700)

## 2013-11-22 LAB — HIV-1 RNA QUANT-NO REFLEX-BLD
HIV 1 RNA Quant: 229 copies/mL — ABNORMAL HIGH (ref <20)
HIV-1 RNA Quant, Log: 2.36 {Log} — ABNORMAL HIGH (ref <1.30)

## 2013-12-05 ENCOUNTER — Ambulatory Visit: Payer: Self-pay | Admitting: Infectious Diseases

## 2013-12-16 ENCOUNTER — Other Ambulatory Visit: Payer: Self-pay | Admitting: *Deleted

## 2013-12-16 DIAGNOSIS — B20 Human immunodeficiency virus [HIV] disease: Secondary | ICD-10-CM

## 2013-12-16 MED ORDER — EMTRICITAB-RILPIVIR-TENOFOV DF 200-25-300 MG PO TABS: 1.0000 | ORAL_TABLET | Freq: Every day | ORAL | Status: DC

## 2013-12-16 NOTE — Telephone Encounter (Signed)
ADAP Application 

## 2014-01-02 ENCOUNTER — Ambulatory Visit (INDEPENDENT_AMBULATORY_CARE_PROVIDER_SITE_OTHER): Payer: Self-pay | Admitting: Infectious Diseases

## 2014-01-02 ENCOUNTER — Encounter: Payer: Self-pay | Admitting: Infectious Diseases

## 2014-01-02 VITALS — BP 148/102 | HR 77 | Temp 98.4°F | Wt 241.2 lb

## 2014-01-02 DIAGNOSIS — Z23 Encounter for immunization: Secondary | ICD-10-CM

## 2014-01-02 DIAGNOSIS — E785 Hyperlipidemia, unspecified: Secondary | ICD-10-CM

## 2014-01-02 DIAGNOSIS — B20 Human immunodeficiency virus [HIV] disease: Secondary | ICD-10-CM

## 2014-01-02 DIAGNOSIS — I10 Essential (primary) hypertension: Secondary | ICD-10-CM

## 2014-01-02 DIAGNOSIS — Z113 Encounter for screening for infections with a predominantly sexual mode of transmission: Secondary | ICD-10-CM

## 2014-01-02 DIAGNOSIS — E669 Obesity, unspecified: Secondary | ICD-10-CM

## 2014-01-02 NOTE — Assessment & Plan Note (Addendum)
Will see her back in 4 months. Repeat labs with genotype at that time.  Hep B restart PAP scheduled.  Given flu shot.  Will have her seen by dental for exposed root on L maxillary molar

## 2014-01-02 NOTE — Assessment & Plan Note (Signed)
She is working on diet and exercise, encouraged her.

## 2014-01-02 NOTE — Assessment & Plan Note (Signed)
Elevated today due to non-adherence. Will ask her to repeat her BP when she is at home. May need to add ACE to HCTZ

## 2014-01-02 NOTE — Addendum Note (Signed)
Addended by: Lorne Skeens D on: 01/02/2014 11:21 AM   Modules accepted: Orders

## 2014-01-02 NOTE — Assessment & Plan Note (Signed)
Encouraged her to diet and exercise.  

## 2014-01-02 NOTE — Progress Notes (Signed)
   Subjective:    Patient ID: Courtney Chavez, female    DOB: March 01, 1975, 38 y.o.   MRN: [id number]  HPI 38 yo F from [place] came to [place] 2001. HIV+ and HTN. Has gotten an IUD. At previous visit in June 2014 she had detectable virus- EFV/NVP resistance (was skipping meds felt like she was "on drugs".). Was changed to complera.  Is Hep B S Ab (-).  Having trouble with wt gain- has been exercising TIW, 30+ minutes. Has been careful about her diet but eats "too much white rice".  Has been off anti-htn and her Bp is up today. No missed complera.  Still has IUD.   Review of Systems  Constitutional: Negative for appetite change and unexpected weight change.  Respiratory: Negative for shortness of breath.   Cardiovascular: Negative for chest pain.  Gastrointestinal: Negative for diarrhea and constipation.  Genitourinary: Negative for difficulty urinating and menstrual problem.  Neurological: Negative for headaches.       Objective:   Physical Exam  Constitutional: She appears well-developed and well-nourished.  Eyes: EOM are normal. Pupils are equal, round, and reactive to light.  Neck: Neck supple.  Cardiovascular: Normal rate, regular rhythm and normal heart sounds.   Pulmonary/Chest: Effort normal and breath sounds normal.  Abdominal: Soft. Bowel sounds are normal. There is no tenderness.  Musculoskeletal: She exhibits no edema.  Lymphadenopathy:    She has no cervical adenopathy.          Assessment & Plan:

## 2014-01-18 ENCOUNTER — Other Ambulatory Visit: Payer: Self-pay | Admitting: *Deleted

## 2014-01-18 DIAGNOSIS — B20 Human immunodeficiency virus [HIV] disease: Secondary | ICD-10-CM

## 2014-01-18 MED ORDER — EMTRICITAB-RILPIVIR-TENOFOV DF 200-25-300 MG PO TABS: 1.0000 | ORAL_TABLET | Freq: Every day | ORAL | Status: DC

## 2014-01-30 ENCOUNTER — Ambulatory Visit: Payer: Self-pay

## 2014-02-06 ENCOUNTER — Ambulatory Visit (INDEPENDENT_AMBULATORY_CARE_PROVIDER_SITE_OTHER): Payer: Self-pay | Admitting: *Deleted

## 2014-02-06 DIAGNOSIS — Z113 Encounter for screening for infections with a predominantly sexual mode of transmission: Secondary | ICD-10-CM

## 2014-02-06 DIAGNOSIS — Z124 Encounter for screening for malignant neoplasm of cervix: Secondary | ICD-10-CM

## 2014-02-06 NOTE — Progress Notes (Signed)
  Subjective:     Courtney Chavez is a 38 y.o. woman who comes in today for a  pap smear only.  Previous abnormal Pap smears: no. Contraception:  IUD.  Pt c/o slight itching which started a week ago.  Objective:    There were no vitals taken for this visit.  Irregular menses since starting IUF Pelvic Exam:  Pap smear obtained.   Assessment:    Screening pap smear.   Plan:    Follow up in one year, or as indicated by Pap results.

## 2014-02-06 NOTE — Patient Instructions (Signed)
Your results will be ready in about a week or more due to the holidays.  I will mail them to you.  Thank you for coming to the Center for your care.  Happy Holidays!  Langley Gauss, RN

## 2014-02-07 LAB — CYTOLOGY - PAP

## 2014-02-08 ENCOUNTER — Encounter: Payer: Self-pay | Admitting: *Deleted

## 2014-02-08 LAB — CERVICOVAGINAL ANCILLARY ONLY: Candida vaginitis: NEGATIVE

## 2014-03-03 ENCOUNTER — Other Ambulatory Visit: Payer: Self-pay | Admitting: Infectious Diseases

## 2014-03-03 DIAGNOSIS — B20 Human immunodeficiency virus [HIV] disease: Secondary | ICD-10-CM

## 2014-03-03 DIAGNOSIS — Z113 Encounter for screening for infections with a predominantly sexual mode of transmission: Secondary | ICD-10-CM

## 2014-03-03 DIAGNOSIS — Z79899 Other long term (current) drug therapy: Secondary | ICD-10-CM

## 2014-03-06 ENCOUNTER — Telehealth: Payer: Self-pay | Admitting: *Deleted

## 2014-03-06 NOTE — Telephone Encounter (Signed)
Left patient a voice mail to let her know that Dr. Johnnye Sima wants her to have labs on 03/07/14 when she is coming in to meet with the financial counselor. Myrtis Hopping

## 2014-03-07 ENCOUNTER — Ambulatory Visit: Payer: Self-pay

## 2014-03-07 ENCOUNTER — Other Ambulatory Visit (INDEPENDENT_AMBULATORY_CARE_PROVIDER_SITE_OTHER): Payer: Self-pay

## 2014-03-07 DIAGNOSIS — Z113 Encounter for screening for infections with a predominantly sexual mode of transmission: Secondary | ICD-10-CM

## 2014-03-07 DIAGNOSIS — Z79899 Other long term (current) drug therapy: Secondary | ICD-10-CM

## 2014-03-07 DIAGNOSIS — E785 Hyperlipidemia, unspecified: Secondary | ICD-10-CM

## 2014-03-07 DIAGNOSIS — B20 Human immunodeficiency virus [HIV] disease: Secondary | ICD-10-CM

## 2014-03-07 LAB — COMPREHENSIVE METABOLIC PANEL
ALT: 13 U/L (ref 0–35)
AST: 16 U/L (ref 0–37)
Albumin: 3.9 g/dL (ref 3.5–5.2)
Alkaline Phosphatase: 39 U/L (ref 39–117)
BUN: 11 mg/dL (ref 6–23)
CHLORIDE: 104 meq/L (ref 96–112)
CO2: 29 mEq/L (ref 19–32)
CREATININE: 0.77 mg/dL (ref 0.50–1.10)
Calcium: 9 mg/dL (ref 8.4–10.5)
Glucose, Bld: 84 mg/dL (ref 70–99)
Potassium: 3.9 mEq/L (ref 3.5–5.3)
SODIUM: 138 meq/L (ref 135–145)
TOTAL PROTEIN: 7.3 g/dL (ref 6.0–8.3)
Total Bilirubin: 0.3 mg/dL (ref 0.2–1.2)

## 2014-03-07 LAB — LIPID PANEL
Cholesterol: 198 mg/dL (ref 0–200)
HDL: 40 mg/dL (ref >39)
LDL CALC: 135 mg/dL — AB (ref 0–99)
Total CHOL/HDL Ratio: 5 Ratio
Triglycerides: 114 mg/dL (ref <150)
VLDL: 23 mg/dL (ref 0–40)

## 2014-03-07 LAB — CBC
HCT: 39.9 % (ref 36.0–46.0)
Hemoglobin: 13.3 g/dL (ref 12.0–15.0)
MCH: 30.3 pg (ref 26.0–34.0)
MCHC: 33.3 g/dL (ref 30.0–36.0)
MCV: 90.9 fL (ref 78.0–100.0)
MPV: 10.7 fL (ref 8.6–12.4)
Platelets: 235 10*3/uL (ref 150–400)
RBC: 4.39 MIL/uL (ref 3.87–5.11)
RDW: 13.5 % (ref 11.5–15.5)
WBC: 5.6 10*3/uL (ref 4.0–10.5)

## 2014-03-08 ENCOUNTER — Other Ambulatory Visit: Payer: Self-pay | Admitting: *Deleted

## 2014-03-08 DIAGNOSIS — B20 Human immunodeficiency virus [HIV] disease: Secondary | ICD-10-CM

## 2014-03-08 LAB — T-HELPER CELL (CD4) - (RCID CLINIC ONLY)
CD4 % Helper T Cell: 32 % — ABNORMAL LOW (ref 33–55)
CD4 T Cell Abs: 620 /uL (ref 400–2700)

## 2014-03-08 LAB — RPR

## 2014-03-08 LAB — HIV-1 RNA ULTRAQUANT REFLEX TO GENTYP+

## 2014-03-08 MED ORDER — EMTRICITAB-RILPIVIR-TENOFOV DF 200-25-300 MG PO TABS: 1.0000 | ORAL_TABLET | Freq: Every day | ORAL | Status: DC

## 2014-04-24 ENCOUNTER — Encounter: Payer: Self-pay | Admitting: Infectious Diseases

## 2014-04-24 ENCOUNTER — Ambulatory Visit (INDEPENDENT_AMBULATORY_CARE_PROVIDER_SITE_OTHER): Payer: Self-pay | Admitting: Infectious Diseases

## 2014-04-24 VITALS — BP 141/101 | HR 67 | Temp 98.7°F | Ht 64.0 in | Wt 247.0 lb

## 2014-04-24 DIAGNOSIS — B20 Human immunodeficiency virus [HIV] disease: Secondary | ICD-10-CM

## 2014-04-24 DIAGNOSIS — I10 Essential (primary) hypertension: Secondary | ICD-10-CM

## 2014-04-24 MED ORDER — LISINOPRIL 5 MG PO TABS: 5.0000 mg | ORAL_TABLET | Freq: Every day | ORAL | Status: DC

## 2014-04-24 NOTE — Assessment & Plan Note (Signed)
Will stop HCTZ and start her on low dose ACE.  Will have her back in 6 weeks to check her Cr and K, BP regular f/u in 3-4 months.

## 2014-04-24 NOTE — Assessment & Plan Note (Signed)
She is doing well on complera.  Will continue this.  Given condoms hep b #2 today.  rtc 3-4 months.

## 2014-04-24 NOTE — Progress Notes (Signed)
   Subjective:    Patient ID: [name], female    DOB: 10/08/1975, 39 y.o.   MRN: [id number]  HPI 39 yo F from [place] came to [place] 2001. HIV+ and HTN. Has gotten an IUD. At previous visit in June 2014 she had detectable virus- EFV/NVP resistance (was skipping meds felt like she was "on drugs"). Was changed to complera.   Has been doing well with exception of BP- has had difficulty with meds as it gives her nocturia. She has been having headaches as well.  "I am getting fat!" has been exercising. Has been watching diet. Has been eating late.   HIV 1 RNA QUANT (copies/mL)  Date Value  03/07/2014 <20  11/21/2013 229*  01/17/2013 <20   CD4 T CELL ABS (/uL)  Date Value  03/07/2014 620  11/21/2013 640  01/17/2013 700   Review of Systems  Constitutional: Negative for fever, chills, appetite change and unexpected weight change.  Gastrointestinal: Negative for diarrhea and constipation.  Genitourinary: Negative for dysuria and difficulty urinating.       Objective:   Physical Exam  Constitutional: She appears well-developed and well-nourished.  HENT:  Mouth/Throat: No oropharyngeal exudate.  Eyes: EOM are normal. Pupils are equal, round, and reactive to light.  Neck: Neck supple.  Cardiovascular: Normal rate, regular rhythm and normal heart sounds.   Pulmonary/Chest: Effort normal and breath sounds normal.  Abdominal: Soft. Bowel sounds are normal. She exhibits no distension. There is no tenderness.  Lymphadenopathy:    She has no cervical adenopathy.      Assessment & Plan:

## 2014-06-05 ENCOUNTER — Other Ambulatory Visit (INDEPENDENT_AMBULATORY_CARE_PROVIDER_SITE_OTHER): Payer: Self-pay

## 2014-06-05 ENCOUNTER — Ambulatory Visit (INDEPENDENT_AMBULATORY_CARE_PROVIDER_SITE_OTHER): Payer: Self-pay | Admitting: *Deleted

## 2014-06-05 VITALS — BP 143/98 | HR 86

## 2014-06-05 DIAGNOSIS — I10 Essential (primary) hypertension: Secondary | ICD-10-CM

## 2014-06-05 DIAGNOSIS — Z23 Encounter for immunization: Secondary | ICD-10-CM

## 2014-06-05 LAB — BASIC METABOLIC PANEL
BUN: 11 mg/dL (ref 6–23)
CALCIUM: 8.9 mg/dL (ref 8.4–10.5)
CO2: 26 meq/L (ref 19–32)
CREATININE: 0.67 mg/dL (ref 0.50–1.10)
Chloride: 105 mEq/L (ref 96–112)
GLUCOSE: 116 mg/dL — AB (ref 70–99)
Potassium: 4 mEq/L (ref 3.5–5.3)
Sodium: 139 mEq/L (ref 135–145)

## 2014-06-05 NOTE — Patient Instructions (Signed)
Keep up with your current exercise and dietary plans.

## 2014-06-05 NOTE — Progress Notes (Signed)
Pt here for Hep B #2 and B/P check.  Pt states that she has not taken her B/P medication this morning.  Pt states that she drinks water throughout the day.  She prepares the food in the home.  She states that she does not add salt to the food.  She states that she uses fresh foods rather than prepared foods.  She started an exercise program about 2 months ago.  She uses a Financial [employer]" and will exercise 2-3 x / day and uses the "challenges" with the "Fit Bit" to increase her daily steps.  RN advised that weight loss is a long term commitment and to continue with her plan.  She also has changed the types of snacks that she eats to raw nuts and other low calorie, low salt foods.  RN praised her for her efforts.

## 2014-07-24 ENCOUNTER — Other Ambulatory Visit: Payer: Self-pay

## 2014-08-14 ENCOUNTER — Ambulatory Visit: Payer: Self-pay | Admitting: Infectious Diseases

## 2014-08-15 ENCOUNTER — Ambulatory Visit: Payer: Self-pay | Admitting: Infectious Diseases

## 2014-09-08 ENCOUNTER — Ambulatory Visit (INDEPENDENT_AMBULATORY_CARE_PROVIDER_SITE_OTHER): Payer: Self-pay | Admitting: Infectious Diseases

## 2014-09-08 ENCOUNTER — Encounter: Payer: Self-pay | Admitting: Infectious Diseases

## 2014-09-08 VITALS — BP 168/122 | HR 70 | Temp 98.1°F | Wt 246.0 lb

## 2014-09-08 DIAGNOSIS — Z79899 Other long term (current) drug therapy: Secondary | ICD-10-CM

## 2014-09-08 DIAGNOSIS — E669 Obesity, unspecified: Secondary | ICD-10-CM

## 2014-09-08 DIAGNOSIS — Z23 Encounter for immunization: Secondary | ICD-10-CM

## 2014-09-08 DIAGNOSIS — I10 Essential (primary) hypertension: Secondary | ICD-10-CM

## 2014-09-08 DIAGNOSIS — L723 Sebaceous cyst: Secondary | ICD-10-CM | POA: Insufficient documentation

## 2014-09-08 DIAGNOSIS — Z113 Encounter for screening for infections with a predominantly sexual mode of transmission: Secondary | ICD-10-CM

## 2014-09-08 DIAGNOSIS — B20 Human immunodeficiency virus [HIV] disease: Secondary | ICD-10-CM

## 2014-09-08 NOTE — Assessment & Plan Note (Signed)
Will see if we can get her into derm or surgery.

## 2014-09-08 NOTE — Progress Notes (Signed)
   Subjective:    Patient ID: Courtney Chavez, female    DOB: 1975/07/21, 39 y.o.   MRN: [id number]  HPI 39 yo F from [place] came to [place] 2001. HIV+ and HTN. Has gotten an IUD. At previous visit in June 2014 she had detectable virus- EFV/NVP resistance (was skipping meds felt like she was "on drugs"). Was changed to complera.  At her prev visit was switched to ACE-I, she was to f/u in 6 weeks for labs and BP recheck (not done). States she has been taking anti-htn qod.   HIV 1 RNA QUANT (copies/mL)  Date Value  03/07/2014 <20  11/21/2013 229*  01/17/2013 <20   CD4 T CELL ABS (/uL)  Date Value  03/07/2014 620  11/21/2013 640  01/17/2013 700    Review of Systems  Constitutional: Negative for appetite change and unexpected weight change.  Respiratory: Negative for shortness of breath.   Cardiovascular: Negative for chest pain.  Gastrointestinal: Negative for diarrhea and constipation.  Genitourinary: Negative for difficulty urinating.  Neurological: Positive for headaches.  Psychiatric/Behavioral: Positive for sleep disturbance.   Last PAP Dec 2015- nl    Objective:   Physical Exam  Constitutional: She appears well-developed and well-nourished.  HENT:  Mouth/Throat: No oropharyngeal exudate.  Eyes: EOM are normal. Pupils are equal, round, and reactive to light.  Neck: Neck supple.  Cardiovascular: Normal rate, regular rhythm and normal heart sounds.   Pulmonary/Chest: Effort normal and breath sounds normal.  Abdominal: Soft. Bowel sounds are normal. There is no tenderness. There is no rebound.  Lymphadenopathy:    She has no cervical adenopathy.  Skin:          Assessment & Plan:

## 2014-09-08 NOTE — Assessment & Plan Note (Signed)
Emphasized she needs to take her anti-htn rx daily.

## 2014-09-08 NOTE — Assessment & Plan Note (Signed)
Completes Hep B #2 today.  Will continue her current ART. Will check her labs today.  Offered condoms.

## 2014-09-08 NOTE — Assessment & Plan Note (Signed)
She is encouraged to diet and exercise.

## 2014-09-12 ENCOUNTER — Ambulatory Visit: Payer: Self-pay

## 2014-09-18 ENCOUNTER — Telehealth: Payer: Self-pay | Admitting: *Deleted

## 2014-09-18 NOTE — Telephone Encounter (Signed)
Patient will come 8/2 10:45 for ADAP renewal. She states she knows what to bring. Landis Gandy, RN

## 2014-09-19 ENCOUNTER — Ambulatory Visit: Payer: Self-pay

## 2014-09-25 ENCOUNTER — Ambulatory Visit: Payer: Self-pay

## 2014-12-07 ENCOUNTER — Encounter: Payer: Self-pay | Admitting: *Deleted

## 2015-02-17 ENCOUNTER — Other Ambulatory Visit: Payer: Self-pay | Admitting: Infectious Diseases

## 2015-02-17 DIAGNOSIS — B20 Human immunodeficiency virus [HIV] disease: Secondary | ICD-10-CM

## 2015-05-21 ENCOUNTER — Ambulatory Visit: Payer: Self-pay

## 2015-05-21 ENCOUNTER — Other Ambulatory Visit (INDEPENDENT_AMBULATORY_CARE_PROVIDER_SITE_OTHER): Payer: Self-pay

## 2015-05-21 DIAGNOSIS — B20 Human immunodeficiency virus [HIV] disease: Secondary | ICD-10-CM

## 2015-05-21 DIAGNOSIS — Z113 Encounter for screening for infections with a predominantly sexual mode of transmission: Secondary | ICD-10-CM

## 2015-05-21 DIAGNOSIS — Z79899 Other long term (current) drug therapy: Secondary | ICD-10-CM

## 2015-05-21 LAB — CBC WITH DIFFERENTIAL/PLATELET
BASOS PCT: 0 %
Basophils Absolute: 0 cells/uL (ref 0–200)
EOS ABS: 48 {cells}/uL (ref 15–500)
Eosinophils Relative: 1 %
HEMATOCRIT: 38.6 % (ref 35.0–45.0)
HEMOGLOBIN: 13 g/dL (ref 11.7–15.5)
LYMPHS ABS: 2016 {cells}/uL (ref 850–3900)
LYMPHS PCT: 42 %
MCH: 30.7 pg (ref 27.0–33.0)
MCHC: 33.7 g/dL (ref 32.0–36.0)
MCV: 91.3 fL (ref 80.0–100.0)
MONO ABS: 432 {cells}/uL (ref 200–950)
MPV: 10.6 fL (ref 7.5–12.5)
Monocytes Relative: 9 %
NEUTROS ABS: 2304 {cells}/uL (ref 1500–7800)
Neutrophils Relative %: 48 %
Platelets: 189 10*3/uL (ref 140–400)
RBC: 4.23 MIL/uL (ref 3.80–5.10)
RDW: 13.8 % (ref 11.0–15.0)
WBC: 4.8 10*3/uL (ref 3.8–10.8)

## 2015-05-21 LAB — COMPREHENSIVE METABOLIC PANEL
ALBUMIN: 3.9 g/dL (ref 3.6–5.1)
ALT: 10 U/L (ref 6–29)
AST: 11 U/L (ref 10–30)
Alkaline Phosphatase: 38 U/L (ref 33–115)
BUN: 11 mg/dL (ref 7–25)
CHLORIDE: 104 mmol/L (ref 98–110)
CO2: 26 mmol/L (ref 20–31)
CREATININE: 0.6 mg/dL (ref 0.50–1.10)
Calcium: 8.6 mg/dL (ref 8.6–10.2)
Glucose, Bld: 117 mg/dL — ABNORMAL HIGH (ref 65–99)
POTASSIUM: 4 mmol/L (ref 3.5–5.3)
SODIUM: 139 mmol/L (ref 135–146)
Total Bilirubin: 0.4 mg/dL (ref 0.2–1.2)
Total Protein: 6.6 g/dL (ref 6.1–8.1)

## 2015-05-21 LAB — LIPID PANEL
CHOL/HDL RATIO: 4.8 ratio (ref <=5.0)
CHOLESTEROL: 211 mg/dL — AB (ref 125–200)
HDL: 44 mg/dL — AB (ref >=46)
LDL CALC: 157 mg/dL — AB (ref <130)
TRIGLYCERIDES: 49 mg/dL (ref <150)
VLDL: 10 mg/dL (ref <30)

## 2015-05-22 LAB — RPR

## 2015-05-22 LAB — T-HELPER CELL (CD4) - (RCID CLINIC ONLY)
CD4 % Helper T Cell: 34 % (ref 33–55)
CD4 T Cell Abs: 690 /uL (ref 400–2700)

## 2015-05-22 LAB — HIV-1 RNA QUANT-NO REFLEX-BLD

## 2015-06-12 ENCOUNTER — Other Ambulatory Visit: Payer: Self-pay | Admitting: Infectious Diseases

## 2015-06-12 DIAGNOSIS — B20 Human immunodeficiency virus [HIV] disease: Secondary | ICD-10-CM

## 2015-06-14 ENCOUNTER — Other Ambulatory Visit: Payer: Self-pay | Admitting: Infectious Diseases

## 2015-06-21 ENCOUNTER — Encounter: Payer: Self-pay | Admitting: Infectious Diseases

## 2015-07-11 ENCOUNTER — Ambulatory Visit (INDEPENDENT_AMBULATORY_CARE_PROVIDER_SITE_OTHER): Payer: Self-pay | Admitting: Infectious Diseases

## 2015-07-11 ENCOUNTER — Ambulatory Visit: Payer: Self-pay | Admitting: *Deleted

## 2015-07-11 ENCOUNTER — Encounter: Payer: Self-pay | Admitting: Infectious Diseases

## 2015-07-11 VITALS — BP 167/114 | HR 73 | Temp 99.5°F | Wt 252.0 lb

## 2015-07-11 DIAGNOSIS — F411 Generalized anxiety disorder: Secondary | ICD-10-CM

## 2015-07-11 DIAGNOSIS — B20 Human immunodeficiency virus [HIV] disease: Secondary | ICD-10-CM

## 2015-07-11 DIAGNOSIS — I10 Essential (primary) hypertension: Secondary | ICD-10-CM

## 2015-07-11 DIAGNOSIS — F1510 Other stimulant abuse, uncomplicated: Secondary | ICD-10-CM

## 2015-07-11 DIAGNOSIS — L723 Sebaceous cyst: Secondary | ICD-10-CM

## 2015-07-11 DIAGNOSIS — E669 Obesity, unspecified: Secondary | ICD-10-CM

## 2015-07-11 MED ORDER — EMTRICITAB-RILPIVIR-TENOFOV AF 200-25-25 MG PO TABS: 1.0000 | ORAL_TABLET | Freq: Every day | ORAL | Status: DC

## 2015-07-11 NOTE — Assessment & Plan Note (Signed)
Encourage diet and exercise. 

## 2015-07-11 NOTE — BH Specialist Note (Signed)
Counselor met with patient as a warm handoff today in the exam room. Patient was oriented times four with good affect and dress. Patient was alert and talkative. Patient spoke honestly and shared freely. Patient stated that her life was going ok but sometimes she did not have the support she needed. Counselor provided support and encouragement accordingly.  Counselor reviewed ways of learning to cope despite whether or not she had good support.  Counselor discussed developing a new set of coping skills.Counselor shared that addressing the issues in her life and processing them with counselor would greatly improve her mood and perspective about life. Counselor recommended that patient use the counseling services here at [facility]. Patient made an appointment for next week.   Rolena Infante, MA, [facility]

## 2015-07-11 NOTE — Progress Notes (Signed)
   Subjective:    Patient ID: Courtney Chavez, female    DOB: [birth year], 40 y.o.   MRN: [id number]  HPI 40 yo F from [place] came to [place] 2001. HIV+ and HTN. Has gotten an IUD. At previous visit in June 2014 she had detectable virus- EFV/NVP resistance (was skipping meds felt like she was "on drugs"). Was changed to complera.  Has 3 children (11, 9 and 5 yo)  HIV 1 RNA QUANT (copies/mL)  Date Value  05/21/2015 <20  03/07/2014 <20  11/21/2013 229*   CD4 T CELL ABS (/uL)  Date Value  05/21/2015 690  03/07/2014 620  11/21/2013 640    Has been taking complera denies missed doses, has been off anti-htn rx.  states she "has been too lazy to take it".  Been stressed. Difficulty falling asleep. Listens to music til she can fall asleep.  Has been exercising TIW.  Needs Health Dept f/u in July. IUD will be 40 yrs old then. Last pap 01-2014  Review of Systems  Constitutional: Negative for appetite change and unexpected weight change.  Respiratory: Negative for shortness of breath.   Cardiovascular: Negative for chest pain.  Gastrointestinal: Negative for diarrhea and constipation.  Genitourinary: Negative for difficulty urinating and menstrual problem.  Neurological: Negative for headaches.  Psychiatric/Behavioral: Positive for sleep disturbance.       Objective:   Physical Exam  Constitutional: She appears well-developed and well-nourished.  HENT:  Mouth/Throat: No oropharyngeal exudate.  Eyes: EOM are normal. Pupils are equal, round, and reactive to light.  Neck: Neck supple.  Cardiovascular: Normal rate, regular rhythm and normal heart sounds.   Pulmonary/Chest: Effort normal and breath sounds normal.  Abdominal: Soft. Bowel sounds are normal. There is no tenderness. There is no rebound.  Musculoskeletal: She exhibits no edema.  Lymphadenopathy:    She has no cervical adenopathy.      Assessment & Plan:

## 2015-07-11 NOTE — Assessment & Plan Note (Signed)
Will have her seen by GYN She meets with Courtney Chavez re: stress, insomnia.  She is not sexually active.  Will update her art Will see her back in 6 months

## 2015-07-11 NOTE — Assessment & Plan Note (Signed)
Encouraged her to resume her anti-htn rx.

## 2015-07-11 NOTE — Assessment & Plan Note (Addendum)
Will have her seen by surgery

## 2015-07-12 ENCOUNTER — Telehealth: Payer: Self-pay | Admitting: *Deleted

## 2015-07-12 NOTE — Telephone Encounter (Signed)
Called patient regarding referral to dermatology and asked if she has the Partnership for [facility]. She does not and I asked her if she would be able to go to get one. She wants to wait until [facility] is providing this service. Advised her I will call her back and initiate the referral once she has the discount card in place. Myrtis Hopping

## 2015-07-18 ENCOUNTER — Ambulatory Visit: Payer: Self-pay | Admitting: *Deleted

## 2015-08-15 ENCOUNTER — Ambulatory Visit: Payer: Self-pay

## 2015-08-18 ENCOUNTER — Other Ambulatory Visit: Payer: Self-pay | Admitting: Infectious Diseases

## 2015-08-18 DIAGNOSIS — I10 Essential (primary) hypertension: Secondary | ICD-10-CM

## 2015-09-17 ENCOUNTER — Encounter: Payer: Self-pay | Admitting: Obstetrics & Gynecology

## 2015-10-03 NOTE — Telephone Encounter (Signed)
Patient did make appt for orange card, but did not return with the missing financial information. Courtney Chavez

## 2015-11-09 ENCOUNTER — Ambulatory Visit (INDEPENDENT_AMBULATORY_CARE_PROVIDER_SITE_OTHER): Payer: Self-pay | Admitting: *Deleted

## 2015-11-09 DIAGNOSIS — Z23 Encounter for immunization: Secondary | ICD-10-CM

## 2015-11-09 DIAGNOSIS — Z124 Encounter for screening for malignant neoplasm of cervix: Secondary | ICD-10-CM

## 2015-11-09 DIAGNOSIS — Z1231 Encounter for screening mammogram for malignant neoplasm of breast: Secondary | ICD-10-CM

## 2015-11-09 DIAGNOSIS — Z113 Encounter for screening for infections with a predominantly sexual mode of transmission: Secondary | ICD-10-CM

## 2015-11-09 NOTE — Patient Instructions (Signed)
Your results will be ready in about a week I will send them to you via MyChart.  Thank you for coming to the Center for your care.  Langley Gauss, RN

## 2015-11-09 NOTE — Progress Notes (Signed)
Subjective:     Courtney Chavez is a 40 y.o. woman who comes in today for a  pap smear only.  Previous abnormal Pap smears: no. Contraception: IUD, pt has had for 5 years.  She will make an appointment at the [facility] to have IUD removed and begin another form of birth control.   Objective:  LMP - 11/07/15.  There were no vitals taken for this visit. Pelvic Exam:  Pap smear obtained.   Assessment:    Screening pap smear.   Plan:    Follow up in one year, or as indicated by Pap results.

## 2015-11-12 LAB — CYTOLOGY - PAP

## 2015-11-12 LAB — CERVICOVAGINAL ANCILLARY ONLY
CHLAMYDIA, DNA PROBE: NEGATIVE
Neisseria Gonorrhea: NEGATIVE

## 2015-11-14 ENCOUNTER — Encounter: Payer: Self-pay | Admitting: *Deleted

## 2015-11-22 DIAGNOSIS — Z23 Encounter for immunization: Secondary | ICD-10-CM

## 2015-12-17 ENCOUNTER — Ambulatory Visit
Admission: RE | Admit: 2015-12-17 | Discharge: 2015-12-17 | Disposition: A | Discharge status: Home / Self Care | Payer: No Typology Code available for payment source | Source: Ambulatory Visit | Attending: Infectious Diseases | Admitting: Infectious Diseases

## 2015-12-17 DIAGNOSIS — Z1231 Encounter for screening mammogram for malignant neoplasm of breast: Secondary | ICD-10-CM

## 2015-12-20 ENCOUNTER — Other Ambulatory Visit: Payer: Self-pay | Admitting: Infectious Diseases

## 2015-12-20 DIAGNOSIS — R928 Other abnormal and inconclusive findings on diagnostic imaging of breast: Secondary | ICD-10-CM

## 2015-12-31 ENCOUNTER — Other Ambulatory Visit (HOSPITAL_COMMUNITY): Payer: Self-pay | Admitting: *Deleted

## 2015-12-31 DIAGNOSIS — R928 Other abnormal and inconclusive findings on diagnostic imaging of breast: Secondary | ICD-10-CM

## 2016-01-03 ENCOUNTER — Other Ambulatory Visit: Payer: Self-pay | Admitting: *Deleted

## 2016-01-03 NOTE — Telephone Encounter (Signed)
Not currently ADAP approved, will need to pay out-of-pocket for lisinopril.  ADAP/RW renewal appt 01/07/16.  [facility] informed.

## 2016-01-07 ENCOUNTER — Ambulatory Visit: Payer: Self-pay

## 2016-01-08 ENCOUNTER — Encounter: Payer: Self-pay | Admitting: Infectious Diseases

## 2016-01-17 ENCOUNTER — Other Ambulatory Visit: Payer: No Typology Code available for payment source

## 2016-01-17 ENCOUNTER — Ambulatory Visit (HOSPITAL_COMMUNITY): Payer: No Typology Code available for payment source

## 2016-02-06 ENCOUNTER — Other Ambulatory Visit: Payer: No Typology Code available for payment source

## 2016-02-14 ENCOUNTER — Encounter (HOSPITAL_COMMUNITY): Payer: Self-pay

## 2016-02-14 ENCOUNTER — Ambulatory Visit
Admission: RE | Admit: 2016-02-14 | Discharge: 2016-02-14 | Disposition: A | Discharge status: Home / Self Care | Payer: No Typology Code available for payment source | Source: Ambulatory Visit | Attending: Obstetrics and Gynecology | Admitting: Obstetrics and Gynecology

## 2016-02-14 ENCOUNTER — Ambulatory Visit (HOSPITAL_COMMUNITY)
Admission: RE | Admit: 2016-02-14 | Discharge: 2016-02-14 | Disposition: A | Discharge status: Home / Self Care | Payer: Self-pay | Source: Ambulatory Visit | Attending: Obstetrics and Gynecology | Admitting: Obstetrics and Gynecology

## 2016-02-14 VITALS — BP 114/78 | Temp 98.1°F | Ht 64.0 in | Wt 261.8 lb

## 2016-02-14 DIAGNOSIS — R928 Other abnormal and inconclusive findings on diagnostic imaging of breast: Secondary | ICD-10-CM

## 2016-02-14 DIAGNOSIS — Z1239 Encounter for other screening for malignant neoplasm of breast: Secondary | ICD-10-CM

## 2016-02-14 HISTORY — DX: Essential (primary) hypertension: I10

## 2016-02-14 NOTE — Patient Instructions (Addendum)
Explained breast self awareness to [employer]. Patient did not need a Pap smear today due to last Pap smear was 11/09/2015. Let her know that due to her history of HIV she will need a Pap smear in one year. Referred patient to the [facility] for a left breast diagnostic mammogram and possible left breast ultrasound per recommendation. Appointment scheduled for Thursday, February 14, 2016 at 1050. Dana Remedios verbalized understanding.  Chasmine Lender, Arvil Chaco, RN 1:24 PM

## 2016-02-14 NOTE — Progress Notes (Signed)
Patient referred to [facility] by the [facility] due to recommending additional imaging of the left breast. Screening mammogram completed 12/17/2015.  Pap Smear: Pap smear not completed today. Last Pap smear was 11/09/2015 at [facility] and normal. Per patient has no history of an abnormal Pap smear. Patients last four Pap smear results are in EPIC.  Physical exam: Breasts Breasts symmetrical. No skin abnormalities bilateral breasts. No nipple retraction bilateral breasts. No nipple discharge bilateral breasts. No lymphadenopathy. No lumps palpated bilateral breasts. No complaints of pain or tenderness on exam. Referred patient to the [facility] for a left breast diagnostic mammogram and possible left breast ultrasound per recommendation. Appointment scheduled for Thursday, February 14, 2016 at 1050.        Pelvic/Bimanual No Pap smear completed today since last Pap smear was 11/09/2015. Pap smear not indicated per BCCCP guidelines.   Smoking History: Patient has never smoked.  Patient Navigation: Patient education provided. Access to services provided for patient through [facility] program.

## 2016-02-22 ENCOUNTER — Encounter (HOSPITAL_COMMUNITY): Payer: Self-pay | Admitting: *Deleted

## 2016-02-25 ENCOUNTER — Encounter: Payer: Self-pay | Admitting: Infectious Diseases

## 2016-02-25 ENCOUNTER — Ambulatory Visit (INDEPENDENT_AMBULATORY_CARE_PROVIDER_SITE_OTHER): Payer: Self-pay | Admitting: Infectious Diseases

## 2016-02-25 ENCOUNTER — Ambulatory Visit: Payer: Self-pay

## 2016-02-25 VITALS — BP 148/84 | HR 86 | Temp 99.2°F | Ht 64.0 in | Wt 264.0 lb

## 2016-02-25 DIAGNOSIS — B20 Human immunodeficiency virus [HIV] disease: Secondary | ICD-10-CM

## 2016-02-25 DIAGNOSIS — I10 Essential (primary) hypertension: Secondary | ICD-10-CM

## 2016-02-25 DIAGNOSIS — Z79899 Other long term (current) drug therapy: Secondary | ICD-10-CM

## 2016-02-25 DIAGNOSIS — Z113 Encounter for screening for infections with a predominantly sexual mode of transmission: Secondary | ICD-10-CM

## 2016-02-25 LAB — COMPREHENSIVE METABOLIC PANEL
ALT: 14 U/L (ref 6–29)
AST: 13 U/L (ref 10–30)
Albumin: 3.8 g/dL (ref 3.6–5.1)
Alkaline Phosphatase: 35 U/L (ref 33–115)
BILIRUBIN TOTAL: 0.4 mg/dL (ref 0.2–1.2)
BUN: 10 mg/dL (ref 7–25)
CALCIUM: 8.8 mg/dL (ref 8.6–10.2)
CO2: 29 mmol/L (ref 20–31)
Chloride: 104 mmol/L (ref 98–110)
Creat: 0.71 mg/dL (ref 0.50–1.10)
GLUCOSE: 131 mg/dL — AB (ref 65–99)
Potassium: 3.8 mmol/L (ref 3.5–5.3)
SODIUM: 138 mmol/L (ref 135–146)
Total Protein: 6.8 g/dL (ref 6.1–8.1)

## 2016-02-25 LAB — CBC
HEMATOCRIT: 38.4 % (ref 35.0–45.0)
Hemoglobin: 12.8 g/dL (ref 11.7–15.5)
MCH: 30.5 pg (ref 27.0–33.0)
MCHC: 33.3 g/dL (ref 32.0–36.0)
MCV: 91.6 fL (ref 80.0–100.0)
MPV: 10.2 fL (ref 7.5–12.5)
Platelets: 207 10*3/uL (ref 140–400)
RBC: 4.19 MIL/uL (ref 3.80–5.10)
RDW: 14 % (ref 11.0–15.0)
WBC: 5 10*3/uL (ref 3.8–10.8)

## 2016-02-25 LAB — LIPID PANEL
CHOL/HDL RATIO: 5.2 ratio — AB (ref <5.0)
Cholesterol: 204 mg/dL — ABNORMAL HIGH (ref <200)
HDL: 39 mg/dL — ABNORMAL LOW (ref >50)
LDL CALC: 148 mg/dL — AB (ref <100)
TRIGLYCERIDES: 83 mg/dL (ref <150)
VLDL: 17 mg/dL (ref <30)

## 2016-02-25 LAB — TSH: TSH: 2.5 m[IU]/L

## 2016-02-25 MED ORDER — ABACAVIR-DOLUTEGRAVIR-LAMIVUD 600-50-300 MG PO TABS: 1.0000 | ORAL_TABLET | Freq: Every day | ORAL | Status: DC

## 2016-02-25 MED ORDER — LISINOPRIL-HYDROCHLOROTHIAZIDE 10-12.5 MG PO TABS: 1.0000 | ORAL_TABLET | Freq: Every day | ORAL | 3 refills | Status: DC

## 2016-02-25 NOTE — Addendum Note (Signed)
Addended by: Aviyah Swetz C on: 02/25/2016 10:35 AM   Modules accepted: Orders

## 2016-02-25 NOTE — Assessment & Plan Note (Addendum)
Will change her to triumeq Will check her labs today Will see her back in 6 months Offered/refused condoms.  Mening today---> we are out.Marland KitchenMarland Kitchen

## 2016-02-25 NOTE — Progress Notes (Signed)
   Subjective:    Patient ID: Courtney Chavez, female    DOB: [birth year], 41 y.o.   MRN: [id number]  HPI 41 yo F from [place] came to [place] 2001. HIV+ and HTN. Has gotten IUD. June 2014 she had detectable virus- EFV/NVP resistance (was skipping meds felt like she was "on drugs"). Was changed to complera.  Has 3 children. Had normal mammo 12-17 and PAP normal 10-2015.  Wants to change medicine, "it's making me fat". Has been exercising, trying to watch diet. Still gaining wt.  Wants increased dose of anti-htn as her BP has been increased and she has been taking double doses.   Went to health dept- stated that she couldn't have her IUD taken out as her BP was too high.   HIV 1 RNA Quant (copies/mL)  Date Value  05/21/2015 <20  03/07/2014 <20  11/21/2013 229 (H)   CD4 T Cell Abs (/uL)  Date Value  05/21/2015 690  03/07/2014 620  11/21/2013 640    Review of Systems  Constitutional: Positive for unexpected weight change. Negative for appetite change.  Respiratory: Negative for shortness of breath.   Cardiovascular: Negative for chest pain.  Gastrointestinal: Negative for constipation and diarrhea.  Genitourinary: Negative for difficulty urinating.  Neurological: Negative for headaches.       Objective:   Physical Exam  Constitutional: She appears well-developed and well-nourished.  HENT:  Mouth/Throat: No oropharyngeal exudate.  Eyes: EOM are normal. Pupils are equal, round, and reactive to light.  Neck: Neck supple.  Cardiovascular: Normal rate, regular rhythm and normal heart sounds.   Pulmonary/Chest: Effort normal and breath sounds normal.  Abdominal: Soft. Bowel sounds are normal. There is no tenderness. There is no rebound.  Musculoskeletal: She exhibits no edema.  Lymphadenopathy:    She has no cervical adenopathy.       Assessment & Plan:

## 2016-02-25 NOTE — Assessment & Plan Note (Signed)
Will change her to ACE-HCTZ rtc in 6 weeks.

## 2016-02-26 ENCOUNTER — Other Ambulatory Visit: Payer: Self-pay | Admitting: *Deleted

## 2016-02-26 DIAGNOSIS — B20 Human immunodeficiency virus [HIV] disease: Secondary | ICD-10-CM

## 2016-02-26 LAB — T-HELPER CELL (CD4) - (RCID CLINIC ONLY)
CD4 T CELL ABS: 700 /uL (ref 400–2700)
CD4 T CELL HELPER: 35 % (ref 33–55)

## 2016-02-26 LAB — RPR

## 2016-02-26 MED ORDER — SULFAMETHOXAZOLE-TRIMETHOPRIM 800-160 MG PO TABS: 1.0000 | ORAL_TABLET | Freq: Two times a day (BID) | ORAL | 5 refills | Status: DC

## 2016-02-27 LAB — HIV-1 RNA QUANT-NO REFLEX-BLD
HIV 1 RNA Quant: <20 copies/mL (ref <20)
HIV-1 RNA Quant, Log: <1.3 Log copies/mL (ref <1.30)

## 2016-02-27 LAB — URINE CYTOLOGY ANCILLARY ONLY
CHLAMYDIA, DNA PROBE: NEGATIVE
NEISSERIA GONORRHEA: NEGATIVE

## 2016-02-29 ENCOUNTER — Encounter: Payer: Self-pay | Admitting: Infectious Diseases

## 2016-04-07 ENCOUNTER — Encounter: Payer: Self-pay | Admitting: Infectious Diseases

## 2016-04-07 ENCOUNTER — Ambulatory Visit (INDEPENDENT_AMBULATORY_CARE_PROVIDER_SITE_OTHER): Payer: Self-pay | Admitting: Infectious Diseases

## 2016-04-07 VITALS — BP 124/90 | HR 81 | Temp 98.7°F | Wt 264.0 lb

## 2016-04-07 DIAGNOSIS — G4700 Insomnia, unspecified: Secondary | ICD-10-CM | POA: Insufficient documentation

## 2016-04-07 DIAGNOSIS — B20 Human immunodeficiency virus [HIV] disease: Secondary | ICD-10-CM

## 2016-04-07 DIAGNOSIS — I10 Essential (primary) hypertension: Secondary | ICD-10-CM

## 2016-04-07 DIAGNOSIS — Z23 Encounter for immunization: Secondary | ICD-10-CM

## 2016-04-07 MED ORDER — MIRTAZAPINE 7.5 MG PO TABS: 7.5000 mg | ORAL_TABLET | Freq: Every day | ORAL | 1 refills | Status: DC

## 2016-04-07 MED ORDER — ABACAVIR-DOLUTEGRAVIR-LAMIVUD 600-50-300 MG PO TABS: 1.0000 | ORAL_TABLET | Freq: Every day | ORAL | 3 refills | Status: DC

## 2016-04-07 NOTE — Assessment & Plan Note (Signed)
Will give her trial of remeron 7.5mg  qhs prn.

## 2016-04-07 NOTE — Assessment & Plan Note (Signed)
BP is better Will continue her current rx.

## 2016-04-07 NOTE — Assessment & Plan Note (Signed)
Not clear how her rx was not sent (apprarently sent as facilty med).  Will start triumeq.  D/i pharm Give her mening today rtc in 3-4 months.

## 2016-04-07 NOTE — Progress Notes (Signed)
   Subjective:    Patient ID: Courtney Chavez, female    DOB: 26-Jan-1976, 41 y.o.   MRN: [id number]  HPI 41 yo F from [place] came to [place] 2001. HIV+ and HTN. Has gotten IUD. June 2014 she had detectable virus- EFV/NVP resistance (was skipping meds felt like she was "on drugs"). Was changed to complera.  Has 3 children. Had normal mammo 12-17 and PAP normal 10-2015.  Jan 2018 we changed her ART due to wt gain (complera). She was rx bactrim but not HIV ART.  She also changed to a different anti-htn. ACE- HCTZ.  Went to health dept- stated that she couldn't have her IUD taken out as her BP was too high.    HIV 1 RNA Quant (copies/mL)  Date Value  02/25/2016 <20  05/21/2015 <20  03/07/2014 <20   CD4 T Cell Abs (/uL)  Date Value  02/25/2016 700  05/21/2015 690  03/07/2014 620    Review of Systems  Constitutional: Positive for unexpected weight change. Negative for appetite change.  Cardiovascular: Negative for chest pain.  Gastrointestinal: Negative for constipation and diarrhea.  Genitourinary: Negative for difficulty urinating.  Neurological: Negative for headaches.  Psychiatric/Behavioral: Positive for sleep disturbance.  does not go to bed til 3am. If she can't sleep, she is on her phone. Does not have to urinate at night. Rare etoh. Has not taken OTC. Tried green tea but gave her headaches.      Objective:   Physical Exam  Constitutional: She appears well-developed and well-nourished.  HENT:  Mouth/Throat: No oropharyngeal exudate.  Eyes: EOM are normal. Pupils are equal, round, and reactive to light.  Neck: Neck supple.  Cardiovascular: Normal rate, regular rhythm and normal heart sounds.   Pulmonary/Chest: Effort normal and breath sounds normal.  Abdominal: Soft. Bowel sounds are normal. There is no tenderness. There is no rebound.  Musculoskeletal: She exhibits no edema.  Lymphadenopathy:    She has no cervical adenopathy.      Assessment & Plan:

## 2016-04-07 NOTE — Addendum Note (Signed)
Addended by: Janyce Llanos F on: 04/07/2016 12:06 PM   Modules accepted: Orders

## 2016-04-24 ENCOUNTER — Other Ambulatory Visit: Payer: Self-pay | Admitting: Infectious Diseases

## 2016-04-24 DIAGNOSIS — B20 Human immunodeficiency virus [HIV] disease: Secondary | ICD-10-CM

## 2016-04-25 ENCOUNTER — Other Ambulatory Visit: Payer: Self-pay | Admitting: Infectious Diseases

## 2016-04-25 DIAGNOSIS — B20 Human immunodeficiency virus [HIV] disease: Secondary | ICD-10-CM

## 2016-06-14 ENCOUNTER — Other Ambulatory Visit: Payer: Self-pay | Admitting: Infectious Diseases

## 2016-06-14 DIAGNOSIS — G4700 Insomnia, unspecified: Secondary | ICD-10-CM

## 2016-08-23 ENCOUNTER — Other Ambulatory Visit: Payer: Self-pay | Admitting: Infectious Diseases

## 2016-08-23 DIAGNOSIS — G4700 Insomnia, unspecified: Secondary | ICD-10-CM

## 2016-10-04 ENCOUNTER — Other Ambulatory Visit: Payer: Self-pay | Admitting: Infectious Diseases

## 2016-10-04 DIAGNOSIS — B20 Human immunodeficiency virus [HIV] disease: Secondary | ICD-10-CM

## 2016-10-22 ENCOUNTER — Encounter: Payer: Self-pay | Admitting: Infectious Diseases

## 2016-10-22 ENCOUNTER — Other Ambulatory Visit: Payer: Self-pay

## 2016-10-22 DIAGNOSIS — B20 Human immunodeficiency virus [HIV] disease: Secondary | ICD-10-CM

## 2016-10-22 LAB — CBC WITH DIFFERENTIAL/PLATELET
BASOS PCT: 0.4 %
Basophils Absolute: 21 cells/uL (ref 0–200)
EOS ABS: 42 {cells}/uL (ref 15–500)
Eosinophils Relative: 0.8 %
HCT: 37.7 % (ref 35.0–45.0)
HEMOGLOBIN: 12.6 g/dL (ref 11.7–15.5)
Lymphs Abs: 2080 cells/uL (ref 850–3900)
MCH: 30 pg (ref 27.0–33.0)
MCHC: 33.4 g/dL (ref 32.0–36.0)
MCV: 89.8 fL (ref 80.0–100.0)
MONOS PCT: 10 %
MPV: 11.2 fL (ref 7.5–12.5)
NEUTROS ABS: 2538 {cells}/uL (ref 1500–7800)
Neutrophils Relative %: 48.8 %
PLATELETS: 218 10*3/uL (ref 140–400)
RBC: 4.2 10*6/uL (ref 3.80–5.10)
RDW: 13.3 % (ref 11.0–15.0)
Total Lymphocyte: 40 %
WBC: 5.2 10*3/uL (ref 3.8–10.8)
WBCMIX: 520 {cells}/uL (ref 200–950)

## 2016-10-22 LAB — COMPLETE METABOLIC PANEL WITH GFR
AG Ratio: 1.4 (calc) (ref 1.0–2.5)
ALBUMIN MSPROF: 4.4 g/dL (ref 3.6–5.1)
ALT: 15 U/L (ref 6–29)
AST: 16 U/L (ref 10–30)
Alkaline phosphatase (APISO): 36 U/L (ref 33–115)
BILIRUBIN TOTAL: 0.4 mg/dL (ref 0.2–1.2)
BUN: 10 mg/dL (ref 7–25)
CALCIUM: 9.6 mg/dL (ref 8.6–10.2)
CHLORIDE: 98 mmol/L (ref 98–110)
CO2: 29 mmol/L (ref 20–32)
Creat: 0.85 mg/dL (ref 0.50–1.10)
GFR, EST AFRICAN AMERICAN: 99 mL/min/{1.73_m2} (ref >=60)
GFR, EST NON AFRICAN AMERICAN: 85 mL/min/{1.73_m2} (ref >=60)
GLUCOSE: 120 mg/dL — AB (ref 65–99)
Globulin: 3.1 g/dL (calc) (ref 1.9–3.7)
Potassium: 3.6 mmol/L (ref 3.5–5.3)
Sodium: 137 mmol/L (ref 135–146)
TOTAL PROTEIN: 7.5 g/dL (ref 6.1–8.1)

## 2016-10-22 NOTE — Progress Notes (Unsigned)
Patient states she was bitten by a bed bug while sitting in our lobby today waiting for labs.  Milly Jakob our clinic manager was informed.   Laverle Patter, RN

## 2016-10-23 LAB — T-HELPER CELL (CD4) - (RCID CLINIC ONLY)
CD4 T CELL HELPER: 35 % (ref 33–55)
CD4 T Cell Abs: 780 /uL (ref 400–2700)

## 2016-10-24 LAB — HIV-1 RNA QUANT-NO REFLEX-BLD
HIV 1 RNA QUANT: DETECTED {copies}/mL — AB
HIV-1 RNA Quant, Log: <1.3 Log copies/mL — AB

## 2016-10-27 ENCOUNTER — Other Ambulatory Visit: Payer: Self-pay | Admitting: Infectious Diseases

## 2016-10-27 DIAGNOSIS — I10 Essential (primary) hypertension: Secondary | ICD-10-CM

## 2016-11-05 ENCOUNTER — Encounter: Payer: Self-pay | Admitting: Infectious Diseases

## 2016-11-05 ENCOUNTER — Ambulatory Visit (INDEPENDENT_AMBULATORY_CARE_PROVIDER_SITE_OTHER): Payer: Self-pay | Admitting: Infectious Diseases

## 2016-11-05 VITALS — BP 167/106 | HR 69 | Temp 98.7°F | Wt 270.0 lb

## 2016-11-05 DIAGNOSIS — IMO0001 Reserved for inherently not codable concepts without codable children: Secondary | ICD-10-CM

## 2016-11-05 DIAGNOSIS — E669 Obesity, unspecified: Secondary | ICD-10-CM

## 2016-11-05 DIAGNOSIS — Z6841 Body Mass Index (BMI) 40.0 and over, adult: Secondary | ICD-10-CM

## 2016-11-05 DIAGNOSIS — B20 Human immunodeficiency virus [HIV] disease: Secondary | ICD-10-CM

## 2016-11-05 DIAGNOSIS — I10 Essential (primary) hypertension: Secondary | ICD-10-CM

## 2016-11-05 DIAGNOSIS — Z79899 Other long term (current) drug therapy: Secondary | ICD-10-CM

## 2016-11-05 DIAGNOSIS — G4700 Insomnia, unspecified: Secondary | ICD-10-CM

## 2016-11-05 DIAGNOSIS — Z23 Encounter for immunization: Secondary | ICD-10-CM

## 2016-11-05 DIAGNOSIS — Z113 Encounter for screening for infections with a predominantly sexual mode of transmission: Secondary | ICD-10-CM

## 2016-11-05 MED ORDER — LISINOPRIL-HYDROCHLOROTHIAZIDE 20-12.5 MG PO TABS: 1.0000 | ORAL_TABLET | Freq: Every day | ORAL | 3 refills | Status: DC

## 2016-11-05 MED ORDER — ORLISTAT 120 MG PO CAPS: 120.0000 mg | ORAL_CAPSULE | Freq: Three times a day (TID) | ORAL | 3 refills | Status: DC

## 2016-11-05 NOTE — Assessment & Plan Note (Signed)
Will increase her BP (ACE component) Return in 2 weeks for repeat BP, BMP

## 2016-11-05 NOTE — Assessment & Plan Note (Signed)
Has had good relief with remeron.  Will refill prn

## 2016-11-05 NOTE — Assessment & Plan Note (Signed)
She is doing well on triumeq.  Will get her PAP Offered/refused condoms.  Flu vax today rtc in 4 months

## 2016-11-05 NOTE — Assessment & Plan Note (Signed)
Will resume her orlistat

## 2016-11-05 NOTE — Progress Notes (Signed)
   Subjective:    Patient ID: Courtney Chavez, female    DOB: 07-17-1975, 41 y.o.   MRN: [id number]  HPI 41 yo F from [place] came to [place] 2001. HIV+ and HTN. Has gotten IUD. June 2014 she had detectable virus- EFV/NVP resistance (was skipping meds felt like she was "on drugs"). Was changed to complera.  Has 3 children. Had normal mammo 12-17 and PAP normal 10-2015.  Jan 2018 we changed her ART due to wt gain (complera).  She was next started on triumeq.  She also changed to a different anti-htn. ACE- HCTZ.  Has had IUD removed.   Is not happy about her wt today. Has been dieting (occas skips meal). Has been exercising- walking, occas treadmill (knees hurt). Walks 30 min, not everyday.  Was seen by dietician prior, did not help.   HIV 1 RNA Quant (copies/mL)  Date Value  10/22/2016 <20 DETECTED (A)  02/25/2016 <20  05/21/2015 <20   CD4 T Cell Abs (/uL)  Date Value  10/22/2016 780  02/25/2016 700  05/21/2015 690    Review of Systems  Constitutional: Positive for unexpected weight change. Negative for appetite change, chills and fever.  Respiratory: Negative for cough and shortness of breath.   Gastrointestinal: Positive for constipation. Negative for diarrhea.  Genitourinary: Negative for difficulty urinating and menstrual problem.  Neurological: Positive for headaches.  menses irregular.  Attributes headaches to constipation.   No fhx breast CA.     Objective:   Physical Exam  Constitutional: She appears well-developed and well-nourished.  HENT:  Mouth/Throat: No oropharyngeal exudate.  Eyes: Pupils are equal, round, and reactive to light. EOM are normal.  Neck: Neck supple.  Cardiovascular: Normal rate, regular rhythm and normal heart sounds.   Pulmonary/Chest: Effort normal and breath sounds normal.  Abdominal: Soft. Bowel sounds are normal. There is no tenderness. There is no rebound.  Musculoskeletal: She exhibits no edema.  Lymphadenopathy:    She has no  cervical adenopathy.       Assessment & Plan:

## 2016-11-06 ENCOUNTER — Telehealth: Payer: Self-pay | Admitting: Licensed Clinical Social Worker

## 2016-11-06 NOTE — Telephone Encounter (Signed)
Nothing comparable Can she meet with Courtney Chavez for med assitance program? thanks

## 2016-11-06 NOTE — Telephone Encounter (Signed)
RX for Xenical is not covered under ADAP, [facility] is requesting another RX that is comparable that should be covered under ADAP. Please advise.

## 2016-11-07 NOTE — Telephone Encounter (Signed)
Notified patient there is no patient assistance program for this medication and no alternative.

## 2016-11-10 ENCOUNTER — Other Ambulatory Visit: Payer: Self-pay | Admitting: Infectious Diseases

## 2016-11-19 ENCOUNTER — Other Ambulatory Visit: Payer: Self-pay

## 2016-11-19 DIAGNOSIS — Z113 Encounter for screening for infections with a predominantly sexual mode of transmission: Secondary | ICD-10-CM

## 2016-11-19 DIAGNOSIS — B20 Human immunodeficiency virus [HIV] disease: Secondary | ICD-10-CM

## 2016-11-19 DIAGNOSIS — Z79899 Other long term (current) drug therapy: Secondary | ICD-10-CM

## 2016-11-19 DIAGNOSIS — IMO0001 Reserved for inherently not codable concepts without codable children: Secondary | ICD-10-CM

## 2016-11-20 LAB — COMPLETE METABOLIC PANEL WITH GFR
AG RATIO: 1.4 (calc) (ref 1.0–2.5)
ALBUMIN MSPROF: 4.2 g/dL (ref 3.6–5.1)
ALT: 14 U/L (ref 6–29)
AST: 14 U/L (ref 10–30)
Alkaline phosphatase (APISO): 38 U/L (ref 33–115)
BILIRUBIN TOTAL: 0.4 mg/dL (ref 0.2–1.2)
BUN: 10 mg/dL (ref 7–25)
CHLORIDE: 101 mmol/L (ref 98–110)
CO2: 29 mmol/L (ref 20–32)
Calcium: 9.4 mg/dL (ref 8.6–10.2)
Creat: 0.85 mg/dL (ref 0.50–1.10)
GFR, EST AFRICAN AMERICAN: 99 mL/min/{1.73_m2} (ref >=60)
GFR, Est Non African American: 85 mL/min/{1.73_m2} (ref >=60)
Globulin: 2.9 g/dL (calc) (ref 1.9–3.7)
Glucose, Bld: 117 mg/dL — ABNORMAL HIGH (ref 65–99)
POTASSIUM: 3.9 mmol/L (ref 3.5–5.3)
Sodium: 137 mmol/L (ref 135–146)
TOTAL PROTEIN: 7.1 g/dL (ref 6.1–8.1)

## 2016-11-20 LAB — LIPID PANEL
Cholesterol: 247 mg/dL — ABNORMAL HIGH (ref <200)
HDL: 40 mg/dL — ABNORMAL LOW (ref >50)
LDL Cholesterol (Calc): 185 mg/dL (calc) — ABNORMAL HIGH
Non-HDL Cholesterol (Calc): 207 mg/dL (calc) — ABNORMAL HIGH (ref <130)
Total CHOL/HDL Ratio: 6.2 (calc) — ABNORMAL HIGH (ref <5.0)
Triglycerides: 102 mg/dL (ref <150)

## 2016-11-20 LAB — CBC
HCT: 35.7 % (ref 35.0–45.0)
Hemoglobin: 12.2 g/dL (ref 11.7–15.5)
MCH: 30.7 pg (ref 27.0–33.0)
MCHC: 34.2 g/dL (ref 32.0–36.0)
MCV: 89.7 fL (ref 80.0–100.0)
MPV: 11.2 fL (ref 7.5–12.5)
PLATELETS: 207 10*3/uL (ref 140–400)
RBC: 3.98 10*6/uL (ref 3.80–5.10)
RDW: 13.5 % (ref 11.0–15.0)
WBC: 5.3 10*3/uL (ref 3.8–10.8)

## 2016-11-20 LAB — T-HELPER CELL (CD4) - (RCID CLINIC ONLY)
CD4 T CELL ABS: 810 /uL (ref 400–2700)
CD4 T CELL HELPER: 36 % (ref 33–55)

## 2016-11-20 LAB — URINE CYTOLOGY ANCILLARY ONLY
CHLAMYDIA, DNA PROBE: NEGATIVE
Neisseria Gonorrhea: NEGATIVE

## 2016-11-20 LAB — RPR: RPR Ser Ql: NONREACTIVE

## 2016-11-21 LAB — HIV-1 RNA QUANT-NO REFLEX-BLD
HIV 1 RNA QUANT: NOT DETECTED {copies}/mL
HIV-1 RNA Quant, Log: <1.3 Log copies/mL

## 2016-12-22 ENCOUNTER — Other Ambulatory Visit: Payer: Self-pay | Admitting: Infectious Diseases

## 2016-12-22 DIAGNOSIS — Z1231 Encounter for screening mammogram for malignant neoplasm of breast: Secondary | ICD-10-CM

## 2017-01-03 ENCOUNTER — Other Ambulatory Visit: Payer: Self-pay | Admitting: Infectious Diseases

## 2017-01-03 DIAGNOSIS — G4700 Insomnia, unspecified: Secondary | ICD-10-CM

## 2017-02-04 ENCOUNTER — Ambulatory Visit (INDEPENDENT_AMBULATORY_CARE_PROVIDER_SITE_OTHER): Payer: Self-pay | Admitting: Infectious Diseases

## 2017-02-04 ENCOUNTER — Other Ambulatory Visit: Payer: Self-pay | Admitting: Pharmacist

## 2017-02-04 ENCOUNTER — Ambulatory Visit: Payer: Self-pay | Admitting: Infectious Diseases

## 2017-02-04 VITALS — BP 145/90 | HR 89 | Temp 99.0°F | Wt 266.0 lb

## 2017-02-04 DIAGNOSIS — Z79899 Other long term (current) drug therapy: Secondary | ICD-10-CM

## 2017-02-04 DIAGNOSIS — Z113 Encounter for screening for infections with a predominantly sexual mode of transmission: Secondary | ICD-10-CM

## 2017-02-04 DIAGNOSIS — Z6841 Body Mass Index (BMI) 40.0 and over, adult: Secondary | ICD-10-CM

## 2017-02-04 DIAGNOSIS — I10 Essential (primary) hypertension: Secondary | ICD-10-CM

## 2017-02-04 DIAGNOSIS — B20 Human immunodeficiency virus [HIV] disease: Secondary | ICD-10-CM

## 2017-02-04 DIAGNOSIS — Z124 Encounter for screening for malignant neoplasm of cervix: Secondary | ICD-10-CM

## 2017-02-04 MED ORDER — TESAMORELIN ACETATE 2 MG ~~LOC~~ SOLR: 2.0000 mg | Freq: Every day | SUBCUTANEOUS | 3 refills | Status: DC

## 2017-02-04 NOTE — Assessment & Plan Note (Signed)
Will try to get her on egrifta. She is amenable to the injections.

## 2017-02-04 NOTE — Assessment & Plan Note (Signed)
She is doing well on art She has gotten flu vax She will get pap today Will help her get mammo Offered/refused condoms.  rtc in 4 months to f/u egrifta.

## 2017-02-04 NOTE — Progress Notes (Signed)
   Subjective:    Patient ID: Courtney Chavez, female    DOB: 20-Apr-1975, 41 y.o.   MRN: [id number]  HPI 41 yo F from [place] came to [place] 2001. HIV+ and HTN.Also hx of EFV/NVP resistance . Was changed to complera then to triumeq due to wt gain.  Has 3 children (08-28-11). Had normal mammo 01-2016 and PAP normal 10-2015. Repeat PAP scheduled for today.   She is on lisinopril-HCTZ for her BP.   Is upset she was unable to get wt loss medication.  Has lost 4# since last visit. Has been exercising, has tried to change her diet.   HIV 1 RNA Quant (copies/mL)  Date Value  11/19/2016 <20 NOT DETECTED  10/22/2016 <20 DETECTED (A)  02/25/2016 <20   CD4 T Cell Abs (/uL)  Date Value  11/19/2016 810  10/22/2016 780  02/25/2016 700    Review of Systems  Constitutional: Negative for appetite change and unexpected weight change.  Respiratory: Negative for cough and shortness of breath.   Gastrointestinal: Positive for constipation. Negative for diarrhea.  Genitourinary: Negative for difficulty urinating and menstrual problem.  Neurological: Negative for headaches.  Psychiatric/Behavioral: Negative for sleep disturbance.  Please see HPI. All other systems reviewed and negative.      Objective:   Physical Exam  Constitutional: She is oriented to person, place, and time. She appears well-developed and well-nourished.  HENT:  Mouth/Throat: No oropharyngeal exudate.  Eyes: EOM are normal. Pupils are equal, round, and reactive to light.  Neck: Neck supple.  Cardiovascular: Normal rate, regular rhythm and normal heart sounds.  Pulmonary/Chest: Effort normal and breath sounds normal.  Abdominal: Soft. Bowel sounds are normal. There is no tenderness. There is no rebound.  Musculoskeletal: She exhibits no edema.  Lymphadenopathy:    She has no cervical adenopathy.  Neurological: She is alert and oriented to person, place, and time.  Psychiatric: She has a normal mood and affect.         Assessment & Plan:

## 2017-02-04 NOTE — Assessment & Plan Note (Signed)
fairly well controlled today.  Hopefully wt loss will improve this.

## 2017-02-04 NOTE — Progress Notes (Signed)
     Subjective:    Courtney Chavez is a 41 y.o. G46P3000 female here for an annual pelvic exam and pap smear.  Patient's last menstrual period was 01/19/2017 (approximate).     Current GYN complaints or concerns: none  Past Medical History:  Diagnosis Date  . HIV infection ([place])   . Hypertension     Gynecologic History G3P3000  Patient's last menstrual period was 01/19/2017 (approximate). Contraception: condoms Last Pap: 11/09/2015. Results were: normal Last Mammogram: 01/2016. Results were: normal  Review of Systems Patient denies any abdominal/pelvic pain, problems with bowel movements, urination, vaginal discharge or intercourse.  Objective:  LMP 01/19/2017 (Approximate)  Physical Exam  Constitutional: Well developed, well nourished, no acute distress. She is alert and oriented x3.  Pelvic: External genitalia is normal in appearance. The vagina is normal in appearance. The cervix is bulbous and easily visualized. No CMT, normal expected cervical mucus present.  Psych: She has a normal mood and affect.    Assessment:  Normal pelvic exam. Thin prep pap was obtained and sent for cytology, HPV and GC/C today.   Plan:  Health Maintenance = Return in 1 year for annual pap screening unless indicated sooner.   Mammogram = she has made her appointment today during clinic visit   HIV = F/U as scheduled with Dr. Johnnye Sima for ongoing HIV care.   Janene Madeira, MSN, NP-C [facility] 02/04/2017 2:45 PM

## 2017-02-09 LAB — CYTOLOGY - PAP
Adequacy: ABSENT
Chlamydia: NEGATIVE
Diagnosis: NEGATIVE
NEISSERIA GONORRHEA: NEGATIVE

## 2017-02-12 ENCOUNTER — Telehealth: Payer: Self-pay | Admitting: *Deleted

## 2017-02-12 DIAGNOSIS — Z6841 Body Mass Index (BMI) 40.0 and over, adult: Principal | ICD-10-CM

## 2017-02-12 NOTE — Telephone Encounter (Signed)
Patient called to advise the weight loss medication he ordered for her is not paid for by [employer]. She advised she will have to pay out of pocket and it is very expensive and she wants something more reasonable cost wise. Advised will let the provider know and see if he can give her something different. Advised not many weight loss medications will be paid for bu the program but will ask and someone will give her a call back.

## 2017-02-13 ENCOUNTER — Other Ambulatory Visit: Payer: Self-pay | Admitting: Obstetrics and Gynecology

## 2017-02-13 DIAGNOSIS — Z1231 Encounter for screening mammogram for malignant neoplasm of breast: Secondary | ICD-10-CM

## 2017-02-23 ENCOUNTER — Encounter: Payer: Self-pay | Admitting: Infectious Diseases

## 2017-03-04 ENCOUNTER — Ambulatory Visit: Payer: Self-pay

## 2017-03-04 NOTE — Telephone Encounter (Signed)
Patient here for ADAP renewal, asked regarding a pill for weight loss. RN advised we were still waiting, but that not much was available via ADAP. RN will send referral to nutrition counseling per verbal order from Dr Johnnye Sima. Landis Gandy, RN

## 2017-03-04 NOTE — Addendum Note (Signed)
Addended by: Landis Gandy on: 03/04/2017 10:32 AM   Modules accepted: Orders

## 2017-03-05 ENCOUNTER — Encounter: Payer: Self-pay | Admitting: Infectious Diseases

## 2017-03-17 ENCOUNTER — Ambulatory Visit
Admission: RE | Admit: 2017-03-17 | Discharge: 2017-03-17 | Disposition: A | Discharge status: Home / Self Care | Payer: No Typology Code available for payment source | Source: Ambulatory Visit | Attending: Obstetrics and Gynecology | Admitting: Obstetrics and Gynecology

## 2017-03-17 ENCOUNTER — Ambulatory Visit (HOSPITAL_COMMUNITY)
Admission: RE | Admit: 2017-03-17 | Discharge: 2017-03-17 | Disposition: A | Discharge status: Home / Self Care | Payer: Self-pay | Source: Ambulatory Visit | Attending: Obstetrics and Gynecology | Admitting: Obstetrics and Gynecology

## 2017-03-17 ENCOUNTER — Encounter (HOSPITAL_COMMUNITY): Payer: Self-pay

## 2017-03-17 VITALS — BP 118/72 | Ht 64.0 in | Wt 266.0 lb

## 2017-03-17 DIAGNOSIS — Z1231 Encounter for screening mammogram for malignant neoplasm of breast: Secondary | ICD-10-CM

## 2017-03-17 DIAGNOSIS — Z1239 Encounter for other screening for malignant neoplasm of breast: Secondary | ICD-10-CM

## 2017-03-17 NOTE — Patient Instructions (Signed)
Explained breast self awareness with Courtney Chavez. Patient did not need a Pap smear today due to last Pap smear was 02/04/2017. Let her know that due to her history of HIV she will need a Pap smear in one year. Referred patient to the [facility] for a screening mammogram. Appointment scheduled for Tuesday, March 17, 2017 at 1130. Patient aware of appointment and will be there. Let patient know the [facility] will follow up with her within the next couple weeks with results of mammogram by letter or phone. [name] verbalized understanding.  [place], Arvil Chaco, RN 9:55 AM

## 2017-03-17 NOTE — Progress Notes (Signed)
No complaints today.   Pap Smear: Pap smear not completed today. Last Pap smear was 02/04/2017 at [facility] and normal. Per patient has no history of an abnormal Pap smear. Patients last five Pap smear results are in EPIC.  Physical exam: Breasts Breasts symmetrical. No skin abnormalities bilateral breasts. No nipple retraction bilateral breasts. No nipple discharge bilateral breasts. No lymphadenopathy. No lumps palpated bilateral breasts. No complaints of pain or tenderness on exam. Referred patient to the [facility] for a screening mammogram. Appointment scheduled for Tuesday, March 17, 2017 at 1130.        Pelvic/Bimanual No Pap smear completed today since last Pap smear was 02/04/2017. Pap smear not indicated per BCCCP guidelines.   Smoking History: Patient has never smoked.  Patient Navigation: Patient education provided. Access to services provided for patient through [facility] program.

## 2017-03-18 ENCOUNTER — Ambulatory Visit: Payer: Self-pay | Admitting: Skilled Nursing Facility1

## 2017-03-18 ENCOUNTER — Encounter (HOSPITAL_COMMUNITY): Payer: Self-pay | Admitting: *Deleted

## 2017-04-04 ENCOUNTER — Other Ambulatory Visit: Payer: Self-pay | Admitting: Infectious Diseases

## 2017-04-04 DIAGNOSIS — G4700 Insomnia, unspecified: Secondary | ICD-10-CM

## 2017-05-01 ENCOUNTER — Other Ambulatory Visit: Payer: Self-pay | Admitting: Infectious Diseases

## 2017-05-01 DIAGNOSIS — B20 Human immunodeficiency virus [HIV] disease: Secondary | ICD-10-CM

## 2017-05-27 ENCOUNTER — Other Ambulatory Visit: Payer: Self-pay

## 2017-05-27 DIAGNOSIS — B20 Human immunodeficiency virus [HIV] disease: Secondary | ICD-10-CM

## 2017-05-27 DIAGNOSIS — Z113 Encounter for screening for infections with a predominantly sexual mode of transmission: Secondary | ICD-10-CM

## 2017-05-27 DIAGNOSIS — Z79899 Other long term (current) drug therapy: Secondary | ICD-10-CM

## 2017-05-28 LAB — CBC
HCT: 36.7 % (ref 35.0–45.0)
Hemoglobin: 12.2 g/dL (ref 11.7–15.5)
MCH: 29.3 pg (ref 27.0–33.0)
MCHC: 33.2 g/dL (ref 32.0–36.0)
MCV: 88.2 fL (ref 80.0–100.0)
MPV: 11.9 fL (ref 7.5–12.5)
Platelets: 208 Thousand/uL (ref 140–400)
RBC: 4.16 Million/uL (ref 3.80–5.10)
RDW: 13 % (ref 11.0–15.0)
WBC: 5.4 Thousand/uL (ref 3.8–10.8)

## 2017-05-28 LAB — COMPLETE METABOLIC PANEL WITHOUT GFR
AG Ratio: 1.7 (calc) (ref 1.0–2.5)
ALT: 12 U/L (ref 6–29)
AST: 15 U/L (ref 10–30)
Albumin: 4.3 g/dL (ref 3.6–5.1)
Alkaline phosphatase (APISO): 38 U/L (ref 33–115)
BUN: 12 mg/dL (ref 7–25)
CO2: 31 mmol/L (ref 20–32)
Calcium: 9.6 mg/dL (ref 8.6–10.2)
Chloride: 101 mmol/L (ref 98–110)
Creat: 1.06 mg/dL (ref 0.50–1.10)
GFR, Est African American: 75 mL/min/1.73m2
GFR, Est Non African American: 65 mL/min/1.73m2
Globulin: 2.6 g/dL (ref 1.9–3.7)
Glucose, Bld: 132 mg/dL — ABNORMAL HIGH (ref 65–99)
Potassium: 3.8 mmol/L (ref 3.5–5.3)
Sodium: 138 mmol/L (ref 135–146)
Total Bilirubin: 0.4 mg/dL (ref 0.2–1.2)
Total Protein: 6.9 g/dL (ref 6.1–8.1)

## 2017-05-28 LAB — URINE CYTOLOGY ANCILLARY ONLY
CHLAMYDIA, DNA PROBE: NEGATIVE
NEISSERIA GONORRHEA: NEGATIVE

## 2017-05-28 LAB — RPR: RPR: NONREACTIVE

## 2017-05-28 LAB — LIPID PANEL
Cholesterol: 208 mg/dL — ABNORMAL HIGH (ref <200)
HDL: 35 mg/dL — ABNORMAL LOW (ref >50)
LDL CHOLESTEROL (CALC): 146 mg/dL — AB
Non-HDL Cholesterol (Calc): 173 mg/dL (calc) — ABNORMAL HIGH (ref <130)
TRIGLYCERIDES: 145 mg/dL (ref <150)
Total CHOL/HDL Ratio: 5.9 (calc) — ABNORMAL HIGH (ref <5.0)

## 2017-05-28 LAB — T-HELPER CELL (CD4) - (RCID CLINIC ONLY)
CD4 % Helper T Cell: 36 % (ref 33–55)
CD4 T Cell Abs: 790 /uL (ref 400–2700)

## 2017-05-29 LAB — HIV-1 RNA QUANT-NO REFLEX-BLD
HIV 1 RNA Quant: <20 copies/mL
HIV-1 RNA Quant, Log: <1.3 Log copies/mL

## 2017-06-10 ENCOUNTER — Encounter: Payer: Self-pay | Admitting: Infectious Diseases

## 2017-06-10 ENCOUNTER — Ambulatory Visit (INDEPENDENT_AMBULATORY_CARE_PROVIDER_SITE_OTHER): Payer: Self-pay | Admitting: Infectious Diseases

## 2017-06-10 ENCOUNTER — Other Ambulatory Visit: Payer: Self-pay | Admitting: Infectious Diseases

## 2017-06-10 VITALS — BP 144/100 | HR 65 | Temp 98.7°F | Wt 253.0 lb

## 2017-06-10 DIAGNOSIS — Z3009 Encounter for other general counseling and advice on contraception: Secondary | ICD-10-CM | POA: Insufficient documentation

## 2017-06-10 DIAGNOSIS — I10 Essential (primary) hypertension: Secondary | ICD-10-CM

## 2017-06-10 DIAGNOSIS — Z309 Encounter for contraceptive management, unspecified: Secondary | ICD-10-CM | POA: Insufficient documentation

## 2017-06-10 DIAGNOSIS — B20 Human immunodeficiency virus [HIV] disease: Secondary | ICD-10-CM

## 2017-06-10 DIAGNOSIS — Z30011 Encounter for initial prescription of contraceptive pills: Secondary | ICD-10-CM

## 2017-06-10 DIAGNOSIS — H539 Unspecified visual disturbance: Secondary | ICD-10-CM

## 2017-06-10 DIAGNOSIS — Z79899 Other long term (current) drug therapy: Secondary | ICD-10-CM

## 2017-06-10 DIAGNOSIS — Z6841 Body Mass Index (BMI) 40.0 and over, adult: Secondary | ICD-10-CM

## 2017-06-10 DIAGNOSIS — Z113 Encounter for screening for infections with a predominantly sexual mode of transmission: Secondary | ICD-10-CM

## 2017-06-10 NOTE — Assessment & Plan Note (Signed)
Encouraged her to continue diet and exercise.

## 2017-06-10 NOTE — Assessment & Plan Note (Signed)
She appears to be doing well, taking meds, losing wt.

## 2017-06-10 NOTE — Assessment & Plan Note (Addendum)
She is doing well on triumeq Offered/refused condoms.  vax are up to date.  rtc in 9 months.

## 2017-06-10 NOTE — Progress Notes (Signed)
Corrected med list 

## 2017-06-10 NOTE — Assessment & Plan Note (Signed)
I offered to get her set up for tubal ligation but states she is scared.  She is scared to get injections as she does not want to gain wt.  Partner only visits every 6 months.  She will f/u with Gyn.

## 2017-06-10 NOTE — Progress Notes (Signed)
   Subjective:    Patient ID: Courtney Chavez, female    DOB: 15-May-1975, 42 y.o.   MRN: [id number]  HPI 42yo F from [place] came to [place] 2001. HIV+ and HTN.Also hx of EFV/NVP resistance . Was changed to complera then to triumeq due to wt gain.  Has 3 children (08-28-11). They are doing well.  Had normal mammo 02-2017 and PAP normal 01-2017.  Has lost 7 more #.  Wants to lose more. Has been unable to get wt loss rx.  No problems with ART (triumeq). Sleepy.  Her BP is high today (attributes this to being sleepy). Continues on zestoretic.   Wants ocps- took her IUD out ~ 1 yr ago. Wants OCP that will not make her gain wt.  Partner is negative.   HIV 1 RNA Quant (copies/mL)  Date Value  05/27/2017 <20 NOT DETECTED  11/19/2016 <20 NOT DETECTED  10/22/2016 <20 DETECTED (A)   CD4 T Cell Abs (/uL)  Date Value  05/27/2017 790  11/19/2016 810  10/22/2016 780    Review of Systems  Constitutional: Negative for appetite change and unexpected weight change.  Respiratory: Negative for cough and shortness of breath.   Gastrointestinal: Positive for constipation. Negative for diarrhea.  Genitourinary: Negative for difficulty urinating.  Psychiatric/Behavioral: Negative for sleep disturbance.  Please see HPI. All other systems reviewed and negative.      Objective:   Physical Exam  Constitutional: She is oriented to person, place, and time. She appears well-developed and well-nourished.  HENT:  Mouth/Throat: No oropharyngeal exudate.  Eyes: Pupils are equal, round, and reactive to light. EOM are normal.  Neck: Neck supple.  Cardiovascular: Normal rate, regular rhythm and normal heart sounds.  Pulmonary/Chest: Effort normal and breath sounds normal.  Abdominal: Soft. Bowel sounds are normal. There is no tenderness. There is no rebound.  Musculoskeletal: She exhibits no edema.  Lymphadenopathy:    She has no cervical adenopathy.  Neurological: She is alert and oriented to person,  place, and time.  Psychiatric: She has a normal mood and affect.       Assessment & Plan:

## 2017-06-10 NOTE — Assessment & Plan Note (Signed)
States she cannot see well at night.  will get her in with optho.

## 2017-07-02 ENCOUNTER — Other Ambulatory Visit: Payer: Self-pay | Admitting: Infectious Diseases

## 2017-07-02 DIAGNOSIS — G4700 Insomnia, unspecified: Secondary | ICD-10-CM

## 2017-07-28 ENCOUNTER — Other Ambulatory Visit: Payer: Self-pay | Admitting: Infectious Diseases

## 2017-07-28 DIAGNOSIS — B20 Human immunodeficiency virus [HIV] disease: Secondary | ICD-10-CM

## 2017-08-19 ENCOUNTER — Ambulatory Visit: Payer: Self-pay

## 2017-08-21 ENCOUNTER — Encounter: Payer: Self-pay | Admitting: Infectious Diseases

## 2017-08-24 ENCOUNTER — Other Ambulatory Visit: Payer: Self-pay | Admitting: Infectious Diseases

## 2017-08-24 DIAGNOSIS — G4700 Insomnia, unspecified: Secondary | ICD-10-CM

## 2017-11-13 ENCOUNTER — Other Ambulatory Visit: Payer: Self-pay | Admitting: Infectious Diseases

## 2017-11-13 DIAGNOSIS — I10 Essential (primary) hypertension: Secondary | ICD-10-CM

## 2017-11-25 ENCOUNTER — Other Ambulatory Visit: Payer: Self-pay | Admitting: Infectious Diseases

## 2017-11-25 DIAGNOSIS — I10 Essential (primary) hypertension: Secondary | ICD-10-CM

## 2018-01-19 ENCOUNTER — Telehealth: Payer: Self-pay | Admitting: *Deleted

## 2018-01-19 NOTE — Telephone Encounter (Signed)
Patient is [redacted] weeks pregnant. She wants to know if her medication is ok. Scheduled with pharmacy clinic tomorrow with Cassie. Please advise. Landis Gandy, RN

## 2018-01-20 ENCOUNTER — Ambulatory Visit (INDEPENDENT_AMBULATORY_CARE_PROVIDER_SITE_OTHER): Payer: Self-pay | Admitting: Pharmacist

## 2018-01-20 DIAGNOSIS — I10 Essential (primary) hypertension: Secondary | ICD-10-CM

## 2018-01-20 DIAGNOSIS — Z331 Pregnant state, incidental: Secondary | ICD-10-CM

## 2018-01-20 DIAGNOSIS — Z3A01 Less than 8 weeks gestation of pregnancy: Secondary | ICD-10-CM

## 2018-01-20 DIAGNOSIS — Z79899 Other long term (current) drug therapy: Secondary | ICD-10-CM

## 2018-01-20 DIAGNOSIS — B20 Human immunodeficiency virus [HIV] disease: Secondary | ICD-10-CM

## 2018-01-20 MED ORDER — EMTRICITABINE-TENOFOVIR DF 200-300 MG PO TABS: 1.0000 | ORAL_TABLET | Freq: Every day | ORAL | 8 refills | Status: DC

## 2018-01-20 MED ORDER — RALTEGRAVIR POTASSIUM 400 MG PO TABS: 400.0000 mg | ORAL_TABLET | Freq: Two times a day (BID) | ORAL | 8 refills | Status: DC

## 2018-01-20 MED ORDER — HYDROCHLOROTHIAZIDE 25 MG PO TABS: 25.0000 mg | ORAL_TABLET | Freq: Every day | ORAL | 11 refills | Status: DC

## 2018-01-20 NOTE — Progress Notes (Signed)
HPI: Courtney Chavez is a 42 y.o. female who presents to the [facility] clinic to discuss and change her ART since she has recently become pregnant.  Patient Active Problem List   Diagnosis Date Noted  . Vision changes 06/10/2017  . Encounter for birth control 06/10/2017  . Insomnia 04/07/2016  . Sebaceous cyst of ear 09/08/2014  . Obesity 11/18/2010  . Hyperlipidemia 10/06/2008  . Essential hypertension 04/22/2006  . Human immunodeficiency virus (HIV) disease ([place]) 12/24/2005  . CHLAMYDIAL INFECTION, HX OF 12/24/2005    Patient's Medications  New Prescriptions   EMTRICITABINE-TENOFOVIR (TRUVADA) 200-300 MG TABLET    Take 1 tablet by mouth daily.   HYDROCHLOROTHIAZIDE (HYDRODIURIL) 25 MG TABLET    Take 1 tablet (25 mg total) by mouth daily.   RALTEGRAVIR (ISENTRESS) 400 MG TABLET    Take 1 tablet (400 mg total) by mouth 2 (two) times daily.  Previous Medications   LISINOPRIL-HYDROCHLOROTHIAZIDE (PRINZIDE,ZESTORETIC) 20-12.5 MG TABLET    TAKE 1 TABLET BY MOUTH DAILY   LISINOPRIL-HYDROCHLOROTHIAZIDE (PRINZIDE,ZESTORETIC) 20-12.5 MG TABLET    TAKE 1 TABLET BY MOUTH DAILY   MIRTAZAPINE (REMERON) 7.5 MG TABLET    TAKE 1 TABLET(7.5 MG) BY MOUTH AT BEDTIME   TRIUMEQ 600-50-300 MG TABLET    TAKE 1 TABLET BY MOUTH DAILY  Modified Medications   No medications on file  Discontinued Medications   No medications on file    Allergies: No Known Allergies  Past Medical History: Past Medical History:  Diagnosis Date  . HIV infection ([place])   . Hypertension     Social History: Social History   Socioeconomic History  . Marital status: Single    Spouse name: Not on file  . Number of children: Not on file  . Years of education: Not on file  . Highest education level: Not on file  Occupational History  . Not on file  Social Needs  . Financial resource strain: Not on file  . Food insecurity:    Worry: Not on file    Inability: Not on file  . Transportation needs:    Medical:  Not on file    Non-medical: Not on file  Tobacco Use  . Smoking status: Never Smoker  . Smokeless tobacco: Never Used  Substance and Sexual Activity  . Alcohol use: Yes    Comment: rarely  . Drug use: No  . Sexual activity: Yes    Partners: Male    Birth control/protection: IUD    Comment: pt. given condoms  Lifestyle  . Physical activity:    Days per week: 3 days    Minutes per session: 30 min  . Stress: Only a little  Relationships  . Social connections:    Talks on phone: More than three times a week    Gets together: Twice a week    Attends religious service: More than 4 times per year    Active member of club or organization: Yes    Attends meetings of clubs or organizations: More than 4 times per year    Relationship status: Never married  Other Topics Concern  . Not on file  Social History Narrative  . Not on file    Labs: Lab Results  Component Value Date   HIV1RNAQUANT <20 NOT DETECTED 01/20/2018   HIV1RNAQUANT <20 NOT DETECTED 05/27/2017   HIV1RNAQUANT <20 NOT DETECTED 11/19/2016   CD4TABS 770 01/20/2018   CD4TABS 790 05/27/2017   CD4TABS 810 11/19/2016    RPR and STI Lab Results  Component Value Date   LABRPR NON-REACTIVE 05/27/2017   LABRPR NON-REACTIVE 11/19/2016   LABRPR NON REAC 02/25/2016   LABRPR NON REAC 05/21/2015   LABRPR NON REAC 03/07/2014    STI Results GC CT  05/27/2017 Negative Negative  02/04/2017 Negative Negative  11/19/2016 Negative Negative  02/25/2016 Negative Negative  11/09/2015 Negative Negative  11/21/2013 NG: Negative CT: Negative  01/28/2010 - NEGATIVE    Hepatitis B Lab Results  Component Value Date   HEPBSAB NEG 11/21/2013   HEPBSAG NEG 08/15/2009   Hepatitis C No results found for: HEPCAB, HCVRNAPCRQN Hepatitis A No results found for: HAV Lipids: Lab Results  Component Value Date   CHOL 208 (H) 05/27/2017   TRIG 145 05/27/2017   HDL 35 (L) 05/27/2017   CHOLHDL 5.9 (H) 05/27/2017   VLDL 17 02/25/2016    LDLCALC 146 (H) 05/27/2017    Current HIV Regimen: Triumeq  Assessment: Courtney Chavez is here today to see me so I can change her HIV medication regimen since she is now pregnant.  She states that she took an at-home pregnancy test and it was positive.  She estimates that she is about [redacted] weeks along.  She is currently taking Triumeq. The dolutegravir in the Triumeq is currently not recommended in the first trimester of pregnancy even though data has recently come out possibly changing that.  Either way, I will change her to Isentress + Truvada today.  I discussed how to take this regimen including two pills in the morning and one additional isentress pill at night.  I showed her pictures of each medication and had her repeat to me how to take them. I will also write this on her AVS.  Her current BP medication is contraindicated in pregnancy.  She is currently taking lisinopril-HCTZ and it is recommended to discontinue lisinopril as soon as possible once pregnancy is detected to decrease risk of injury and death in the developing fetus.  I explained this to her.  Will stop the lisinopril and have her just take HCTZ for now as that is safe in pregnancy. She is currently taking 12.5 mg of HCTZ but will increase to 25 mg since she will no longer be taking lisinopril with it.  I checked her BP today and it was great at 115/84 and HR was 74.  She states this pregnancy was definitely not planned and that she was just getting excited about losing 30 pounds.  She has been taking Alli to help with weight loss which I instructed her to stop immediately.  I will check labs today including a HCG level to confirm pregnancy.  She doesn't have a routine OB/GYN right now, so I will get Sharyn Lull to help set her up with one.  She sees Dr. Johnnye Sima in about 6 weeks.  Plan: - Stop Triumeq - Stop lisinopril-HCTZ - Start Isentress 400 mg PO BID + Truvada PO daily - Start taking HCTZ 25 mg PO daily - HIV VL, CD4, HCG level  today - F/u with Dr. Johnnye Sima 1/23 at 145pm  Cassie L. Kuppelweiser, PharmD, BCIDP, AAHIVP, [place] [facility] 01/22/2018, 1:39 PM

## 2018-01-20 NOTE — Telephone Encounter (Signed)
Thanks Cassie Can you change her when you see her tomorrow?

## 2018-01-20 NOTE — Telephone Encounter (Signed)
She is on several medications that need to be changed- her ACE inhibitor is teratogenic, the dolutegravir in the triumeq could also be problematic.  Id consider changing her to complera? Will ask Cassie to chime in   thanks

## 2018-01-20 NOTE — Telephone Encounter (Signed)
I tend to favor Isentress + Truvada in pregnancy if she is up for it.

## 2018-01-20 NOTE — Patient Instructions (Signed)
It was great seeing you today!  Please stop taking your Triumeq.   Please start taking: One Isentress pill and one Truvada pill in the morning and one Isentress pill at night.  We will see you back in January.

## 2018-01-20 NOTE — Telephone Encounter (Signed)
Saw her today and changed her! Also stopped her lisinopril and increased the dose of her HCTZ for her blood pressure.

## 2018-01-21 ENCOUNTER — Telehealth: Payer: Self-pay | Admitting: *Deleted

## 2018-01-21 DIAGNOSIS — B20 Human immunodeficiency virus [HIV] disease: Secondary | ICD-10-CM

## 2018-01-21 DIAGNOSIS — Z3201 Encounter for pregnancy test, result positive: Secondary | ICD-10-CM

## 2018-01-21 LAB — T-HELPER CELL (CD4) - (RCID CLINIC ONLY)
CD4 % Helper T Cell: 36 % (ref 33–55)
CD4 T Cell Abs: 770 /uL (ref 400–2700)

## 2018-01-21 NOTE — Telephone Encounter (Signed)
Referral to OB/GYN high risk clinic. Patient has adap/ryan white. Positive pregnancy test at [facility]. Medications changed at pharmacy visit 12/4. Landis Gandy, RN

## 2018-01-21 NOTE — Progress Notes (Signed)
Can you help her with referral to OB/GYN? She hasn't seen one in along time and doesn't know where to go.Marland KitchenMarland Kitchen

## 2018-01-21 NOTE — Telephone Encounter (Signed)
Thanks Cassie 

## 2018-01-22 LAB — HIV-1 RNA QUANT-NO REFLEX-BLD
HIV 1 RNA Quant: <20 copies/mL
HIV-1 RNA Quant, Log: <1.3 Log copies/mL

## 2018-01-22 LAB — HCG, QUANTITATIVE, PREGNANCY: HCG, Total, QN: 130068 m[IU]/mL

## 2018-01-22 MED ORDER — HYDROCHLOROTHIAZIDE 25 MG PO TABS: 25.0000 mg | ORAL_TABLET | Freq: Every day | ORAL | 11 refills | Status: DC

## 2018-01-22 MED ORDER — RALTEGRAVIR POTASSIUM 400 MG PO TABS: 400.0000 mg | ORAL_TABLET | Freq: Two times a day (BID) | ORAL | 8 refills | Status: DC

## 2018-01-22 MED ORDER — EMTRICITABINE-TENOFOVIR DF 200-300 MG PO TABS: 1.0000 | ORAL_TABLET | Freq: Every day | ORAL | 8 refills | Status: DC

## 2018-01-22 NOTE — Progress Notes (Signed)
Thank you :)

## 2018-01-27 ENCOUNTER — Encounter: Payer: Self-pay | Admitting: Family Medicine

## 2018-02-17 NOTE — L&D Delivery Note (Addendum)
Patient: Courtney Chavez MRN: [id number]  GBS status: Negative   Patient is a 43 y.o. now [id number] s/p NSVD at [redacted]w[redacted]d, who was admitted for IOL due to oligo and BPP 6/8. SROM rupture date, rupture time, delivery date, or delivery time have not been documented prior to delivery with moderate mec fluid.    Delivery Note At 5:38 AM a viable female was delivered via Vaginal, Spontaneous (Presentation: Right OA).  APGAR: 7,8; weight pending.   Placenta status: Spontaneous, intact Cord: 3 vessel, intact with the following complications: Precipitous labor, 3cm two hours prior to delivery.   Anesthesia: Lidocaine for repair   Episiotomy: None   Lacerations: 1st degree   Suture Repair: 3.0 vicryl Est. Blood Loss (mL): ~600    Delivery call made at onset of delivery due to moderate meconium stained fluid. Head delivered right OA. No nuchal cord present. Shoulder and body delivered in usual fashion. Infant with placed on mother's abdomen, dried and bulb suctioned, then given to NICU team for further resuscitation at 1.5 minutes of life. Cord clamped x 2 after 1-minute delay, and cut by physician. Cord blood drawn. Placenta delivered spontaneously with gentle cord traction. Fundus firm with massage and Pitocin. Given cytotec 800mg  rectally due to continued uterine bleeding, resolved with expulsion of few clots. Perineum inspected and found to have 1st degree laceration, which was repaired with 3.0 vicryl with good hemostasis achieved.  Mom to postpartum.  Baby to NICU.  [name] 08/31/2018, 6:09 AM   I was gloved and present for entire delivery SVD without particulate meconium No difficulty with shoulders Lacerations as listed above Repair of same supervised by me  Elevated blood pressure during second stage of unmedicated delivery and immediate postpartum. Normotensive within first hour. RN advised to give morning dose of Labetalol prior to transfer to Postpartum. Notify Faculty 1st call  for recurrence of elevated blood pressure.  Message sent to clinic requesting coordination of postpartum care:  Please schedule this patient for Postpartum visit in: 4 weeks with the following provider: Any provider For C/S patients schedule nurse incision check in weeks 2 weeks: no High risk pregnancy complicated by: CHTN and A2GDM Delivery mode:  SVD Anticipated Birth Control:  Interval BTL (self-pay) PP Procedures needed: BP check  Schedule Integrated [place] visit: no  Mallie Snooks, CNM 08/31/18 7:04 AM

## 2018-02-24 ENCOUNTER — Other Ambulatory Visit: Payer: Self-pay

## 2018-02-24 ENCOUNTER — Other Ambulatory Visit: Payer: No Typology Code available for payment source

## 2018-02-24 DIAGNOSIS — Z113 Encounter for screening for infections with a predominantly sexual mode of transmission: Secondary | ICD-10-CM

## 2018-02-24 DIAGNOSIS — B20 Human immunodeficiency virus [HIV] disease: Secondary | ICD-10-CM

## 2018-02-24 DIAGNOSIS — Z79899 Other long term (current) drug therapy: Secondary | ICD-10-CM

## 2018-03-01 ENCOUNTER — Encounter: Payer: Self-pay | Admitting: Infectious Diseases

## 2018-03-01 LAB — LIPID PANEL

## 2018-03-01 LAB — RPR

## 2018-03-01 LAB — CBC

## 2018-03-01 LAB — HIV-1 RNA QUANT-NO REFLEX-BLD

## 2018-03-01 LAB — COMPREHENSIVE METABOLIC PANEL

## 2018-03-10 ENCOUNTER — Encounter: Payer: No Typology Code available for payment source | Admitting: Infectious Diseases

## 2018-03-11 ENCOUNTER — Ambulatory Visit (INDEPENDENT_AMBULATORY_CARE_PROVIDER_SITE_OTHER): Payer: Self-pay | Admitting: Infectious Diseases

## 2018-03-11 ENCOUNTER — Encounter: Payer: Self-pay | Admitting: Infectious Diseases

## 2018-03-11 VITALS — BP 124/81 | HR 99 | Temp 98.5°F | Wt 246.0 lb

## 2018-03-11 DIAGNOSIS — Z23 Encounter for immunization: Secondary | ICD-10-CM

## 2018-03-11 DIAGNOSIS — Z113 Encounter for screening for infections with a predominantly sexual mode of transmission: Secondary | ICD-10-CM

## 2018-03-11 DIAGNOSIS — G4700 Insomnia, unspecified: Secondary | ICD-10-CM

## 2018-03-11 DIAGNOSIS — Z79899 Other long term (current) drug therapy: Secondary | ICD-10-CM

## 2018-03-11 DIAGNOSIS — B20 Human immunodeficiency virus [HIV] disease: Secondary | ICD-10-CM

## 2018-03-11 DIAGNOSIS — I10 Essential (primary) hypertension: Secondary | ICD-10-CM

## 2018-03-11 MED ORDER — PRENATAL VITAMIN 27-0.8 MG PO TABS: 1.0000 | ORAL_TABLET | Freq: Every day | ORAL | 3 refills | Status: DC

## 2018-03-11 NOTE — Assessment & Plan Note (Signed)
Will recheck her labs today flyu vax today PNV today Will see her back in 3 months for close monitoring, provided she is < 20.  Will cc high risk OB, my great appreciation to them.

## 2018-03-11 NOTE — Addendum Note (Signed)
Addended by: Shanon Brow on: 03/11/2018 03:29 PM   Modules accepted: Orders

## 2018-03-11 NOTE — Progress Notes (Signed)
   Subjective:    Patient ID: Courtney Chavez, female    DOB: 01-Dec-1975, 43 y.o.   MRN: [id number]  HPI 43yo F from [place] came to [place] 2001. HIV+ and HTN.Also hx ofEFV/NVP resistance . Was changed to complerathen to triumeq due to wt gain.  Has 3 children(09-28-12). They are doing well.  She is currently pregnant. Believes she is 5 weeks.  She is currently on Isentress- truvada.  States she is getting tubes tied after her pregnancy.  Not on PNV yet.  She quit taking her remeron now that pregnant.  Had normal mammo 02-2017 and PAP normal 01-2017. Before pregnancy, she has lost 35#. Was down to 235. Had more energy.   Review of Systems  Constitutional: Negative for appetite change and unexpected weight change.  Gastrointestinal: Positive for constipation. Negative for diarrhea.  Genitourinary: Negative for difficulty urinating.  Psychiatric/Behavioral: Positive for sleep disturbance.  Please see HPI. All other systems reviewed and negative.     Objective:   Physical Exam Constitutional:      Appearance: Normal appearance.  HENT:     Mouth/Throat:     Mouth: Mucous membranes are moist.     Pharynx: No oropharyngeal exudate.  Eyes:     Extraocular Movements: Extraocular movements intact.     Pupils: Pupils are equal, round, and reactive to light.  Neck:     Musculoskeletal: Normal range of motion and neck supple.  Cardiovascular:     Rate and Rhythm: Normal rate and regular rhythm.  Pulmonary:     Effort: Pulmonary effort is normal.     Breath sounds: Normal breath sounds.  Abdominal:     General: Bowel sounds are normal. There is no distension.     Palpations: Abdomen is soft.     Tenderness: There is no abdominal tenderness.  Musculoskeletal:        General: No swelling.  Neurological:     Mental Status: She is alert.  Psychiatric:        Mood and Affect: Mood normal.        Assessment & Plan:

## 2018-03-11 NOTE — Assessment & Plan Note (Signed)
She will continue on her HCTZ. Category B

## 2018-03-11 NOTE — Assessment & Plan Note (Signed)
Will hold remeron til she has f/u with OB.

## 2018-03-12 LAB — T-HELPER CELL (CD4) - (RCID CLINIC ONLY)
CD4 % Helper T Cell: 38 % (ref 33–55)
CD4 T Cell Abs: 720 /uL (ref 400–2700)

## 2018-03-16 LAB — HIV-1 RNA QUANT-NO REFLEX-BLD
HIV 1 RNA Quant: <20 copies/mL
HIV-1 RNA Quant, Log: <1.3 Log copies/mL

## 2018-03-16 LAB — COMPREHENSIVE METABOLIC PANEL
AG Ratio: 1.2 (calc) (ref 1.0–2.5)
ALT: 14 U/L (ref 6–29)
AST: 14 U/L (ref 10–30)
Albumin: 3.7 g/dL (ref 3.6–5.1)
Alkaline phosphatase (APISO): 38 U/L (ref 33–115)
BUN: 10 mg/dL (ref 7–25)
CO2: 24 mmol/L (ref 20–32)
Calcium: 9.6 mg/dL (ref 8.6–10.2)
Chloride: 100 mmol/L (ref 98–110)
Creat: 0.64 mg/dL (ref 0.50–1.10)
GLUCOSE: 110 mg/dL — AB (ref 65–99)
Globulin: 3.1 g/dL (calc) (ref 1.9–3.7)
Potassium: 3.9 mmol/L (ref 3.5–5.3)
Sodium: 133 mmol/L — ABNORMAL LOW (ref 135–146)
Total Bilirubin: 0.3 mg/dL (ref 0.2–1.2)
Total Protein: 6.8 g/dL (ref 6.1–8.1)

## 2018-03-16 LAB — CBC
HCT: 35.3 % (ref 35.0–45.0)
Hemoglobin: 11.9 g/dL (ref 11.7–15.5)
MCH: 30.1 pg (ref 27.0–33.0)
MCHC: 33.7 g/dL (ref 32.0–36.0)
MCV: 89.4 fL (ref 80.0–100.0)
MPV: 11.5 fL (ref 7.5–12.5)
Platelets: 205 10*3/uL (ref 140–400)
RBC: 3.95 10*6/uL (ref 3.80–5.10)
RDW: 13.5 % (ref 11.0–15.0)
WBC: 7.6 10*3/uL (ref 3.8–10.8)

## 2018-03-16 LAB — RPR: RPR: NONREACTIVE

## 2018-03-19 ENCOUNTER — Encounter: Payer: Self-pay | Admitting: Family Medicine

## 2018-04-20 ENCOUNTER — Other Ambulatory Visit: Payer: Self-pay

## 2018-04-20 ENCOUNTER — Ambulatory Visit (INDEPENDENT_AMBULATORY_CARE_PROVIDER_SITE_OTHER): Payer: Self-pay | Admitting: *Deleted

## 2018-04-20 ENCOUNTER — Encounter: Payer: Self-pay | Admitting: *Deleted

## 2018-04-20 VITALS — BP 137/82 | HR 92 | Wt 256.5 lb

## 2018-04-20 DIAGNOSIS — O98719 Human immunodeficiency virus [HIV] disease complicating pregnancy, unspecified trimester: Secondary | ICD-10-CM | POA: Insufficient documentation

## 2018-04-20 DIAGNOSIS — O0990 Supervision of high risk pregnancy, unspecified, unspecified trimester: Secondary | ICD-10-CM

## 2018-04-20 DIAGNOSIS — O10919 Unspecified pre-existing hypertension complicating pregnancy, unspecified trimester: Secondary | ICD-10-CM | POA: Insufficient documentation

## 2018-04-20 DIAGNOSIS — O0930 Supervision of pregnancy with insufficient antenatal care, unspecified trimester: Secondary | ICD-10-CM

## 2018-04-20 DIAGNOSIS — O09529 Supervision of elderly multigravida, unspecified trimester: Secondary | ICD-10-CM | POA: Insufficient documentation

## 2018-04-20 DIAGNOSIS — Z113 Encounter for screening for infections with a predominantly sexual mode of transmission: Secondary | ICD-10-CM

## 2018-04-20 LAB — POCT URINALYSIS DIP (DEVICE)
Bilirubin Urine: NEGATIVE
Glucose, UA: NEGATIVE mg/dL
Hgb urine dipstick: NEGATIVE
Leukocytes,Ua: NEGATIVE
Nitrite: NEGATIVE
Protein, ur: NEGATIVE mg/dL
Specific Gravity, Urine: 1.02 (ref 1.005–1.030)
Urobilinogen, UA: 0.2 mg/dL (ref 0.0–1.0)
pH: 6.5 (ref 5.0–8.0)

## 2018-04-20 NOTE — Progress Notes (Signed)
New Ob intake completed and pregnancy information packet given. Labs drawn and [place] for anatomy scheduled. Pt has HIV and is established w/[facility] - taking meds. Last Pap 02/04/17 - normal. Initial prenatal visit scheduled on 3/18.

## 2018-04-21 ENCOUNTER — Other Ambulatory Visit: Payer: Self-pay

## 2018-04-21 ENCOUNTER — Other Ambulatory Visit (HOSPITAL_COMMUNITY): Payer: Self-pay | Admitting: *Deleted

## 2018-04-21 ENCOUNTER — Encounter (HOSPITAL_COMMUNITY): Payer: Self-pay

## 2018-04-21 ENCOUNTER — Ambulatory Visit (HOSPITAL_COMMUNITY): Payer: Self-pay | Admitting: *Deleted

## 2018-04-21 ENCOUNTER — Ambulatory Visit (HOSPITAL_COMMUNITY)
Admission: RE | Admit: 2018-04-21 | Discharge: 2018-04-21 | Disposition: A | Discharge status: Home / Self Care | Payer: Self-pay | Source: Ambulatory Visit | Attending: Family Medicine | Admitting: Family Medicine

## 2018-04-21 DIAGNOSIS — O0930 Supervision of pregnancy with insufficient antenatal care, unspecified trimester: Secondary | ICD-10-CM

## 2018-04-21 DIAGNOSIS — O10012 Pre-existing essential hypertension complicating pregnancy, second trimester: Secondary | ICD-10-CM

## 2018-04-21 DIAGNOSIS — O09522 Supervision of elderly multigravida, second trimester: Secondary | ICD-10-CM

## 2018-04-21 DIAGNOSIS — O0990 Supervision of high risk pregnancy, unspecified, unspecified trimester: Secondary | ICD-10-CM

## 2018-04-21 DIAGNOSIS — Z363 Encounter for antenatal screening for malformations: Secondary | ICD-10-CM

## 2018-04-21 DIAGNOSIS — O10919 Unspecified pre-existing hypertension complicating pregnancy, unspecified trimester: Secondary | ICD-10-CM

## 2018-04-21 DIAGNOSIS — Z362 Encounter for other antenatal screening follow-up: Secondary | ICD-10-CM

## 2018-04-21 DIAGNOSIS — O98719 Human immunodeficiency virus [HIV] disease complicating pregnancy, unspecified trimester: Secondary | ICD-10-CM

## 2018-04-21 DIAGNOSIS — O0932 Supervision of pregnancy with insufficient antenatal care, second trimester: Secondary | ICD-10-CM

## 2018-04-21 DIAGNOSIS — Z3A20 20 weeks gestation of pregnancy: Secondary | ICD-10-CM

## 2018-04-21 DIAGNOSIS — O98712 Human immunodeficiency virus [HIV] disease complicating pregnancy, second trimester: Secondary | ICD-10-CM

## 2018-04-22 LAB — URINE CULTURE, OB REFLEX

## 2018-04-22 LAB — GC/CHLAMYDIA PROBE AMP (~~LOC~~) NOT AT ARMC
Chlamydia: NEGATIVE
Neisseria Gonorrhea: NEGATIVE

## 2018-04-22 LAB — CULTURE, OB URINE

## 2018-04-24 NOTE — Progress Notes (Signed)
Patient seen and assessed by nursing staff.  Agree with documentation and plan.  

## 2018-04-30 LAB — HEMOGLOBINOPATHY EVALUATION
Ferritin: 19 ng/mL (ref 15–150)
HGB A2 QUANT: 2.5 % (ref 1.8–3.2)
Hgb A: 95.4 % — ABNORMAL LOW (ref 96.4–98.8)
Hgb C: 0 %
Hgb F Quant: 2.1 % — ABNORMAL HIGH (ref 0.0–2.0)
Hgb S: 0 %
Hgb Solubility: NEGATIVE
Hgb Variant: 0 %

## 2018-04-30 LAB — COMPREHENSIVE METABOLIC PANEL
ALT: 22 IU/L (ref 0–32)
AST: 14 IU/L (ref 0–40)
Albumin/Globulin Ratio: 1.3 (ref 1.2–2.2)
Albumin: 3.6 g/dL — ABNORMAL LOW (ref 3.8–4.8)
Alkaline Phosphatase: 41 IU/L (ref 39–117)
BUN/Creatinine Ratio: 15 (ref 9–23)
BUN: 9 mg/dL (ref 6–24)
Bilirubin Total: <0.2 mg/dL (ref 0.0–1.2)
CO2: 21 mmol/L (ref 20–29)
Calcium: 9.6 mg/dL (ref 8.7–10.2)
Chloride: 99 mmol/L (ref 96–106)
Creatinine, Ser: 0.6 mg/dL (ref 0.57–1.00)
GFR calc Af Amer: 129 mL/min/{1.73_m2} (ref >59)
GFR calc non Af Amer: 112 mL/min/{1.73_m2} (ref >59)
Globulin, Total: 2.7 g/dL (ref 1.5–4.5)
Glucose: 106 mg/dL — ABNORMAL HIGH (ref 65–99)
Potassium: 3.9 mmol/L (ref 3.5–5.2)
SODIUM: 136 mmol/L (ref 134–144)
Total Protein: 6.3 g/dL (ref 6.0–8.5)

## 2018-04-30 LAB — INHERITEST(R) CF/SMA PANEL

## 2018-04-30 LAB — PRENATAL PROFILE I(LABCORP)
Antibody Screen: NEGATIVE
BASOS ABS: 0 10*3/uL (ref 0.0–0.2)
Basos: 0 %
EOS (ABSOLUTE): 0 10*3/uL (ref 0.0–0.4)
Eos: 1 %
Hematocrit: 29.2 % — ABNORMAL LOW (ref 34.0–46.6)
Hemoglobin: 10.2 g/dL — ABNORMAL LOW (ref 11.1–15.9)
Hepatitis B Surface Ag: NEGATIVE
Immature Grans (Abs): 0 10*3/uL (ref 0.0–0.1)
Immature Granulocytes: 1 %
Lymphocytes Absolute: 1.8 10*3/uL (ref 0.7–3.1)
Lymphs: 21 %
MCH: 32 pg (ref 26.6–33.0)
MCHC: 34.9 g/dL (ref 31.5–35.7)
MCV: 92 fL (ref 79–97)
Monocytes Absolute: 0.7 10*3/uL (ref 0.1–0.9)
Monocytes: 8 %
Neutrophils Absolute: 5.8 10*3/uL (ref 1.4–7.0)
Neutrophils: 69 %
PLATELETS: 199 10*3/uL (ref 150–450)
RBC: 3.19 x10E6/uL — ABNORMAL LOW (ref 3.77–5.28)
RDW: 12.8 % (ref 11.7–15.4)
RPR Ser Ql: NONREACTIVE
Rh Factor: POSITIVE
Rubella Antibodies, IGG: 8.18 index (ref >0.99)
WBC: 8.3 10*3/uL (ref 3.4–10.8)

## 2018-04-30 LAB — TSH: TSH: 1.55 u[IU]/mL (ref 0.450–4.500)

## 2018-04-30 LAB — PROTEIN / CREATININE RATIO, URINE
Creatinine, Urine: 131.9 mg/dL
PROTEIN UR: 10.6 mg/dL
Protein/Creat Ratio: 80 mg/g creat (ref 0–200)

## 2018-05-03 ENCOUNTER — Telehealth: Payer: Self-pay | Admitting: Family Medicine

## 2018-05-03 NOTE — Telephone Encounter (Signed)
Called the patient to inform of the hospital being placed under virus restrictions. The patient understood ans stated she is coming to the appointment alone.

## 2018-05-05 ENCOUNTER — Ambulatory Visit: Payer: Self-pay | Admitting: Clinical

## 2018-05-05 ENCOUNTER — Ambulatory Visit (INDEPENDENT_AMBULATORY_CARE_PROVIDER_SITE_OTHER): Payer: Self-pay | Admitting: Family Medicine

## 2018-05-05 ENCOUNTER — Encounter: Payer: Self-pay | Admitting: Family Medicine

## 2018-05-05 ENCOUNTER — Other Ambulatory Visit: Payer: Self-pay

## 2018-05-05 VITALS — BP 115/73 | HR 110 | Wt 259.9 lb

## 2018-05-05 DIAGNOSIS — O10912 Unspecified pre-existing hypertension complicating pregnancy, second trimester: Secondary | ICD-10-CM

## 2018-05-05 DIAGNOSIS — O98719 Human immunodeficiency virus [HIV] disease complicating pregnancy, unspecified trimester: Secondary | ICD-10-CM

## 2018-05-05 DIAGNOSIS — O26892 Other specified pregnancy related conditions, second trimester: Secondary | ICD-10-CM

## 2018-05-05 DIAGNOSIS — O0990 Supervision of high risk pregnancy, unspecified, unspecified trimester: Secondary | ICD-10-CM

## 2018-05-05 DIAGNOSIS — H539 Unspecified visual disturbance: Secondary | ICD-10-CM

## 2018-05-05 DIAGNOSIS — I10 Essential (primary) hypertension: Secondary | ICD-10-CM

## 2018-05-05 DIAGNOSIS — F5101 Primary insomnia: Secondary | ICD-10-CM

## 2018-05-05 DIAGNOSIS — O98712 Human immunodeficiency virus [HIV] disease complicating pregnancy, second trimester: Secondary | ICD-10-CM

## 2018-05-05 DIAGNOSIS — O09522 Supervision of elderly multigravida, second trimester: Secondary | ICD-10-CM

## 2018-05-05 DIAGNOSIS — Z3A22 22 weeks gestation of pregnancy: Secondary | ICD-10-CM

## 2018-05-05 DIAGNOSIS — O10919 Unspecified pre-existing hypertension complicating pregnancy, unspecified trimester: Secondary | ICD-10-CM

## 2018-05-05 LAB — POCT URINALYSIS DIP (DEVICE)
BILIRUBIN URINE: NEGATIVE
Glucose, UA: NEGATIVE mg/dL
Hgb urine dipstick: NEGATIVE
Ketones, ur: NEGATIVE mg/dL
Leukocytes,Ua: NEGATIVE
Nitrite: NEGATIVE
PH: 6 (ref 5.0–8.0)
Protein, ur: NEGATIVE mg/dL
Specific Gravity, Urine: 1.02 (ref 1.005–1.030)
Urobilinogen, UA: 1 mg/dL (ref 0.0–1.0)

## 2018-05-05 MED ORDER — LABETALOL HCL 200 MG PO TABS: 200.0000 mg | ORAL_TABLET | Freq: Two times a day (BID) | ORAL | 3 refills | Status: DC

## 2018-05-05 MED ORDER — HYDROXYZINE PAMOATE 50 MG PO CAPS: 50.0000 mg | ORAL_CAPSULE | Freq: Three times a day (TID) | ORAL | 0 refills | Status: DC | PRN

## 2018-05-05 MED ORDER — ASPIRIN EC 81 MG PO TBEC: 81.0000 mg | DELAYED_RELEASE_TABLET | Freq: Every day | ORAL | 3 refills | Status: AC

## 2018-05-05 NOTE — Patient Instructions (Addendum)

## 2018-05-05 NOTE — BH Specialist Note (Signed)
Integrated Behavioral Health Initial Visit  MRN: [id number] Name: Courtney Chavez  Number of [facility] Clinician visits:: 1/6 Session Start time: 9:33  Session End time: 9:43 Total time: 15 minutes  Type of Service: [place] Interpretor:No. Interpretor Name and Language: n/a   Warm Hand Off Completed.       SUBJECTIVE: Courtney Chavez is a 43 y.o. female accompanied by n/a Patient was referred by Darron Doom, MD for Initial OB introduction to integrated behavioral health services . Patient reports the following symptoms/concerns: Pt states she is mildly concerned about coronavirus; no other concern at this time. Duration of problem: n/a; Severity of problem: n/a  OBJECTIVE: Mood: Normal and Affect: Appropriate Risk of harm to self or others: No plan to harm self or others  LIFE CONTEXT: Family and Social: Pt lives with her children (15yo, 13yo, 8yo)  School/Work: - Self-Care: - Life Changes: Current pregnancy   GOALS ADDRESSED: Patient will: 1. Increase knowledge and/or ability of: healthy habits   INTERVENTIONS: Interventions utilized: Psychoeducation and/or Health Education and Link to [employer]  Standardized Assessments completed: GAD-7 and PHQ 9  ASSESSMENT: Patient currently experiencing Supervision of high risk pregnancy , antepartum.   Patient may benefit from Initial OB introduction to integrated behavioral health services .  PLAN: 1. Follow up with behavioral health clinician on : As needed 2. Behavioral recommendations:  -Continue taking prenatal vitamin daily, as recommended by medical provider -Take home and read "[employer]: Tips for Social Distancing, Quarantine, and Isolation During An Infectious Disease Outbreak".   3. Referral(s): Integrated [employer] Services (In Clinic)   [place], [place]  Depression screen [facility] 2/9 05/05/2018 04/20/2018 02/04/2017 11/05/2016  04/07/2016  Decreased Interest 2 2 0 0 0  Down, Depressed, Hopeless 0 0 0 0 0  PHQ - 2 Score 2 2 0 0 0  Altered sleeping 2 3 - - -  Tired, decreased energy 2 2 - - -  Change in appetite 0 2 - - -  Feeling bad or failure about yourself  0 0 - - -  Trouble concentrating 0 2 - - -  Moving slowly or fidgety/restless 0 0 - - -  Suicidal thoughts 0 0 - - -  PHQ-9 Score 6 11 - - -  Difficult doing work/chores - - - - -   GAD 7 : Generalized Anxiety Score 05/05/2018 04/20/2018  Nervous, Anxious, on Edge 0 0  Control/stop worrying 0 2  Worry too much - different things 2 2  Trouble relaxing 0 0  Restless 0 0  Easily annoyed or irritable 0 0  Afraid - awful might happen 0 0  Total GAD 7 Score 2 4

## 2018-05-05 NOTE — Progress Notes (Signed)
Subjective:   Courtney Chavez is a 43 y.o. G4P3003 at [redacted]w[redacted]d by LMP being seen today for her first obstetrical visit.  Her obstetrical history is significant for advanced maternal age, obesity and Hypertension, HIV disease. Patient does not intend to breast feed. Pregnancy history fully reviewed.  Patient reports no complaints.  HISTORY: OB History  Gravida Para Term Preterm AB Living  4 3 3  0 0 3  SAB TAB Ectopic Multiple Live Births  0 0 0 0 3    # Outcome Date GA Lbr Len/2nd Weight Sex Delivery Anes PTL Lv  4 Current           3 Term 02/10/10 [redacted]w[redacted]d   M Vag-Spont None N LIV  2 Term 07/22/05 [redacted]w[redacted]d   M Vag-Spont EPI N LIV  1 Term 03/03/04 [redacted]w[redacted]d  7 lb (3.175 kg) M Vag-Spont EPI N LIV     Name: Fortino Sic   Last pap smear was  01/2017 and was normal Past Medical History:  Diagnosis Date  . HIV (human immunodeficiency virus infection) ([place])   . HIV infection ([place])   . Hypertension    Past Surgical History:  Procedure Laterality Date  . NO PAST SURGERIES     Family History  Problem Relation Age of Onset  . Hypertension Mother   . Hypertension Father   . Hypertension Brother    Social History   Tobacco Use  . Smoking status: Never Smoker  . Smokeless tobacco: Never Used  Substance Use Topics  . Alcohol use: Not Currently    Comment: rarely  . Drug use: No   No Known Allergies Current Outpatient Medications on File Prior to Visit  Medication Sig Dispense Refill  . emtricitabine-tenofovir (TRUVADA) 200-300 MG tablet Take 1 tablet by mouth daily. 30 tablet 8  . Prenatal Vit-Fe Fumarate-FA (PRENATAL VITAMIN) 27-0.8 MG TABS Take 1 tablet by mouth daily. 90 tablet 3   No current facility-administered medications on file prior to visit.      Exam   Vitals:   05/05/18 0844  BP: 115/73  Pulse: (!) 110  Weight: 259 lb 14.4 oz (117.9 kg)   Fetal Heart Rate (bpm): 155  Uterus:   FH 23  System: General: well-developed, well-nourished female in no acute  distress   Skin: normal coloration and turgor, no rashes   Neurologic: oriented, normal, negative, normal mood   Extremities: normal strength, tone, and muscle mass, ROM of all joints is normal   HEENT PERRLA, extraocular movement intact and sclera clear, anicteric   Mouth/Teeth mucous membranes moist, pharynx normal without lesions and dental hygiene good   Neck supple and no masses   Cardiovascular: regular rate and rhythm   Respiratory:  no respiratory distress, normal breath sounds   Abdomen: soft, non-tender; bowel sounds normal; no masses,  no organomegaly     Assessment:   Pregnancy: [id number] Patient Active Problem List   Diagnosis Date Noted  . Late prenatal care, antepartum 04/21/2018  . Supervision of high risk pregnancy, antepartum 04/20/2018  . Chronic hypertension in pregnancy 04/20/2018  . 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 Advanced maternal age in multigravida 04/20/2018  . Vision changes 06/10/2017  . Insomnia 04/07/2016  . Sebaceous cyst of ear 09/08/2014  . Obesity 11/18/2010  . Hyperlipidemia 10/06/2008  . Essential hypertension 04/22/2006  . Human immunodeficiency virus (HIV) disease ([place]) 12/24/2005     Plan:  1. Chronic hypertension in pregnancy Has been on HCTZ, although  category B, needs to change to Labetalol Normal baseline labs ASA now to limit Pre-eclampsia risk - labetalol (NORMODYNE) 200 MG tablet; Take 1 tablet (200 mg total) by mouth 2 (two) times daily.  Dispense: 60 tablet; Refill: 3 - aspirin EC 81 MG tablet; Take 1 tablet (81 mg total) by mouth daily.  Dispense: 90 tablet; Refill: 3  2. Essential hypertension ON HCTZ prior  3. Supervision of high risk pregnancy, antepartum   4. HIV infection in mother during pregnancy, antepartum On Truvada--see ID Undetectable viral load  5. Multigravida of advanced maternal age in second trimester Has normal u/s--check NIPT - Genetic Screening  6. Primary  insomnia Taking Remeron--trial of vistaril (safe in pregnancy) - hydrOXYzine (VISTARIL) 50 MG capsule; Take 1 capsule (50 mg total) by mouth 3 (three) times daily as needed.  Dispense: 30 capsule; Refill: 0  7. Vision disturbance Would like to see optho - Ambulatory referral to Ophthalmology   Initial labs drawn. Continue prenatal vitamins. Genetic Screening discussed, NIPS: ordered. Ultrasound discussed; fetal anatomic survey: results reviewed. Problem list reviewed and updated. The nature of [place] with multiple MDs and other Advanced Practice Providers was explained to patient; also emphasized that residents, students are part of our team. Routine obstetric precautions reviewed. Return in 4 weeks (on 06/02/2018) for 28 wk labs, [place].

## 2018-05-10 ENCOUNTER — Encounter: Payer: Self-pay | Admitting: *Deleted

## 2018-05-19 ENCOUNTER — Ambulatory Visit (HOSPITAL_COMMUNITY): Payer: Self-pay

## 2018-05-25 ENCOUNTER — Encounter: Payer: Self-pay | Admitting: *Deleted

## 2018-06-01 ENCOUNTER — Telehealth: Payer: Self-pay | Admitting: Obstetrics & Gynecology

## 2018-06-01 NOTE — Telephone Encounter (Signed)
Called the patient to inform of COVID19 restrictions and our new location. No questions at this times.

## 2018-06-02 ENCOUNTER — Other Ambulatory Visit: Payer: Self-pay

## 2018-06-02 ENCOUNTER — Ambulatory Visit (INDEPENDENT_AMBULATORY_CARE_PROVIDER_SITE_OTHER): Payer: Self-pay | Admitting: Obstetrics and Gynecology

## 2018-06-02 VITALS — BP 131/80 | HR 81 | Temp 98.7°F | Wt 269.4 lb

## 2018-06-02 DIAGNOSIS — Z3A26 26 weeks gestation of pregnancy: Secondary | ICD-10-CM

## 2018-06-02 DIAGNOSIS — O98712 Human immunodeficiency virus [HIV] disease complicating pregnancy, second trimester: Secondary | ICD-10-CM

## 2018-06-02 DIAGNOSIS — O09522 Supervision of elderly multigravida, second trimester: Secondary | ICD-10-CM

## 2018-06-02 DIAGNOSIS — Z23 Encounter for immunization: Secondary | ICD-10-CM

## 2018-06-02 DIAGNOSIS — O10919 Unspecified pre-existing hypertension complicating pregnancy, unspecified trimester: Secondary | ICD-10-CM

## 2018-06-02 DIAGNOSIS — O0990 Supervision of high risk pregnancy, unspecified, unspecified trimester: Secondary | ICD-10-CM

## 2018-06-02 DIAGNOSIS — O98719 Human immunodeficiency virus [HIV] disease complicating pregnancy, unspecified trimester: Secondary | ICD-10-CM

## 2018-06-02 DIAGNOSIS — O0992 Supervision of high risk pregnancy, unspecified, second trimester: Secondary | ICD-10-CM

## 2018-06-02 DIAGNOSIS — I10 Essential (primary) hypertension: Secondary | ICD-10-CM

## 2018-06-02 DIAGNOSIS — O10912 Unspecified pre-existing hypertension complicating pregnancy, second trimester: Secondary | ICD-10-CM

## 2018-06-02 NOTE — Progress Notes (Signed)
   PRENATAL VISIT NOTE  Subjective:  Courtney Chavez is a 43 y.o. G4P3003 at [redacted]w[redacted]d being seen today for ongoing prenatal care.  She is currently monitored for the following issues for this high-risk pregnancy and has Human immunodeficiency virus (HIV) disease ([place]); Hyperlipidemia; Essential hypertension; Obesity; Sebaceous cyst of ear; Insomnia; Vision changes; Supervision of high risk pregnancy, antepartum; Chronic hypertension in pregnancy; HIV infection in mother during pregnancy, antepartum; Advanced maternal age in multigravida; and Late prenatal care, antepartum on their problem list.  Patient reports Hiccups.  Contractions: Not present. Vag. Bleeding: None.  Movement: Present. Denies leaking of fluid.   The following portions of the patient's history were reviewed and updated as appropriate: allergies, current medications, past family history, past medical history, past social history, past surgical history and problem list.   Objective:   Vitals:   06/02/18 0828  BP: 131/80  Pulse: 81  Temp: 98.7 F (37.1 C)  Weight: 269 lb 6.4 oz (122.2 kg)    Fetal Status: Fetal Heart Rate (bpm): 152 Fundal Height: 27 cm Movement: Present     General:  Alert, oriented and cooperative. Patient is in no acute distress.  Skin: Skin is warm and dry. No rash noted.   Cardiovascular: Normal heart rate noted  Respiratory: Normal respiratory effort, no problems with respiration noted  Abdomen: Soft, gravid, appropriate for gestational age.  Pain/Pressure: Present     Pelvic: Cervical exam deferred        Extremities: Normal range of motion.  Edema: None  Mental Status: Normal mood and affect. Normal behavior. Normal judgment and thought content.   Assessment and Plan:  Pregnancy: G4P3003 at [redacted]w[redacted]d  1. Supervision of high risk pregnancy, antepartum  - Tdap vaccine greater than or equal to 7yo IM - virtual visit in 2 weeks.  - 2 hour GTT today  2. Essential hypertension  Patient was given  a cuff today for home use  BP good today Continue labetalol and BASA   3. HIV infection in mother during pregnancy, antepartum  Followed by ID Will need growth [place] at 28/36 weeks. Scheduled for 06/21/2018  4. Multigravida of advanced maternal age in second trimester  Antenatal testing at 36 weeks    There are no diagnoses linked to this encounter. Preterm labor symptoms and general obstetric precautions including but not limited to vaginal bleeding, contractions, leaking of fluid and fetal movement were reviewed in detail with the patient. Please refer to After Visit Summary for other counseling recommendations.   Return in about 2 weeks (around 06/16/2018) for virtual visit in 2 weeks, Start antenatal testing at 47 weeks.  Future Appointments  Date Time Provider [place]  06/11/2018 11:00 AM [facility]  06/21/2018  2:00 PM [place] [facility]  06/21/2018  2:00 PM [place] 3 WH-MFCUS M[facility]-US  06/25/2018 11:00 AM Johnnye Sima Doroteo Bradford, MD [facility]    Noni Saupe, NP

## 2018-06-02 NOTE — Progress Notes (Signed)
Added patient to Baby Rx schedule Optimization. Gave Bp Cuff

## 2018-06-03 LAB — CBC
Hematocrit: 30.4 % — ABNORMAL LOW (ref 34.0–46.6)
Hemoglobin: 10.2 g/dL — ABNORMAL LOW (ref 11.1–15.9)
MCH: 30.7 pg (ref 26.6–33.0)
MCHC: 33.6 g/dL (ref 31.5–35.7)
MCV: 92 fL (ref 79–97)
Platelets: 203 10*3/uL (ref 150–450)
RBC: 3.32 x10E6/uL — ABNORMAL LOW (ref 3.77–5.28)
RDW: 12.9 % (ref 11.7–15.4)
WBC: 6.9 10*3/uL (ref 3.4–10.8)

## 2018-06-03 LAB — GLUCOSE TOLERANCE, 2 HOURS W/ 1HR
Glucose, 1 hour: 201 mg/dL — ABNORMAL HIGH (ref 65–179)
Glucose, 2 hour: 161 mg/dL — ABNORMAL HIGH (ref 65–152)
Glucose, Fasting: 107 mg/dL — ABNORMAL HIGH (ref 65–91)

## 2018-06-03 LAB — HIV 1/2 AB DIFFERENTIATION
HIV 1 Ab: POSITIVE — AB
HIV 2 Ab: NEGATIVE
NOTE (HIV CONF MULTIP: POSITIVE

## 2018-06-03 LAB — RPR: RPR Ser Ql: NONREACTIVE

## 2018-06-03 LAB — HIV ANTIBODY (ROUTINE TESTING W REFLEX): HIV Screen 4th Generation wRfx: REACTIVE — AB

## 2018-06-04 ENCOUNTER — Encounter: Payer: Self-pay | Admitting: Obstetrics and Gynecology

## 2018-06-04 ENCOUNTER — Other Ambulatory Visit: Payer: Self-pay | Admitting: Obstetrics and Gynecology

## 2018-06-04 DIAGNOSIS — O24419 Gestational diabetes mellitus in pregnancy, unspecified control: Secondary | ICD-10-CM | POA: Insufficient documentation

## 2018-06-04 MED ORDER — FAMOTIDINE 20 MG PO TABS: 20.0000 mg | ORAL_TABLET | Freq: Two times a day (BID) | ORAL | 0 refills | Status: DC

## 2018-06-07 ENCOUNTER — Telehealth: Payer: Self-pay | Admitting: Family Medicine

## 2018-06-07 NOTE — Telephone Encounter (Signed)
Called patient to get her scheduled with diabetes edu. Patient scheduled for 4/23 @ 4pm. Patient verbalized understanding and has office new address.

## 2018-06-10 ENCOUNTER — Other Ambulatory Visit: Payer: Self-pay

## 2018-06-10 ENCOUNTER — Telehealth: Payer: Self-pay | Admitting: Obstetrics & Gynecology

## 2018-06-10 ENCOUNTER — Telehealth: Payer: Self-pay | Admitting: Infectious Diseases

## 2018-06-10 NOTE — Telephone Encounter (Signed)
COVID-19 Pre-Screening Questions:06/10/18  Do you currently have a fever (>100 F), chills or unexplained body aches? NO  Are you currently experiencing new cough, shortness of breath, sore throat, runny nose? NO   Have you recently travelled outside the state of [place] in the last 14 days? NO   Have you been in contact with someone that is currently pending confirmation of Covid19 testing or has been confirmed to have the [place] virus?  NO  **If the patient answers NO to ALL questions -  advise the patient to please call the clinic before coming to the office should any symptoms develop.

## 2018-06-10 NOTE — Telephone Encounter (Signed)
The patient called in stated she is not feeling well and would like to reschedule for next week.

## 2018-06-11 ENCOUNTER — Other Ambulatory Visit: Payer: Self-pay

## 2018-06-11 DIAGNOSIS — Z113 Encounter for screening for infections with a predominantly sexual mode of transmission: Secondary | ICD-10-CM

## 2018-06-11 DIAGNOSIS — Z79899 Other long term (current) drug therapy: Secondary | ICD-10-CM

## 2018-06-11 DIAGNOSIS — B20 Human immunodeficiency virus [HIV] disease: Secondary | ICD-10-CM

## 2018-06-11 LAB — T-HELPER CELL (CD4) - (RCID CLINIC ONLY)
CD4 % Helper T Cell: 37 % (ref 33–55)
CD4 T Cell Abs: 530 /uL (ref 400–2700)

## 2018-06-14 ENCOUNTER — Telehealth: Payer: Self-pay | Admitting: Advanced Practice Midwife

## 2018-06-14 LAB — CBC
HCT: 29.5 % — ABNORMAL LOW (ref 35.0–45.0)
Hemoglobin: 10.2 g/dL — ABNORMAL LOW (ref 11.7–15.5)
MCH: 31.4 pg (ref 27.0–33.0)
MCHC: 34.6 g/dL (ref 32.0–36.0)
MCV: 90.8 fL (ref 80.0–100.0)
MPV: 11.6 fL (ref 7.5–12.5)
Platelets: 195 10*3/uL (ref 140–400)
RBC: 3.25 10*6/uL — ABNORMAL LOW (ref 3.80–5.10)
RDW: 13 % (ref 11.0–15.0)
WBC: 6.8 10*3/uL (ref 3.8–10.8)

## 2018-06-14 LAB — HIV-1 RNA QUANT-NO REFLEX-BLD
HIV 1 RNA Quant: <20 copies/mL
HIV-1 RNA Quant, Log: <1.3 Log copies/mL

## 2018-06-14 LAB — LIPID PANEL
Cholesterol: 247 mg/dL — ABNORMAL HIGH (ref <200)
HDL: 51 mg/dL (ref >=50)
LDL Cholesterol (Calc): 166 mg/dL (calc) — ABNORMAL HIGH
Non-HDL Cholesterol (Calc): 196 mg/dL (calc) — ABNORMAL HIGH (ref <130)
Total CHOL/HDL Ratio: 4.8 (calc) (ref <5.0)
Triglycerides: 158 mg/dL — ABNORMAL HIGH (ref <150)

## 2018-06-14 LAB — COMPLETE METABOLIC PANEL WITH GFR
AG Ratio: 1 (calc) (ref 1.0–2.5)
ALT: 26 U/L (ref 6–29)
AST: 23 U/L (ref 10–30)
Albumin: 3.1 g/dL — ABNORMAL LOW (ref 3.6–5.1)
Alkaline phosphatase (APISO): 36 U/L (ref 31–125)
BUN/Creatinine Ratio: 11 (calc) (ref 6–22)
BUN: 6 mg/dL — ABNORMAL LOW (ref 7–25)
CO2: 25 mmol/L (ref 20–32)
Calcium: 8.6 mg/dL (ref 8.6–10.2)
Chloride: 105 mmol/L (ref 98–110)
Creat: 0.55 mg/dL (ref 0.50–1.10)
GFR, Est African American: 133 mL/min/{1.73_m2} (ref >=60)
GFR, Est Non African American: 115 mL/min/{1.73_m2} (ref >=60)
Globulin: 3 g/dL (calc) (ref 1.9–3.7)
Glucose, Bld: 101 mg/dL — ABNORMAL HIGH (ref 65–99)
Potassium: 4 mmol/L (ref 3.5–5.3)
Sodium: 135 mmol/L (ref 135–146)
Total Bilirubin: 0.3 mg/dL (ref 0.2–1.2)
Total Protein: 6.1 g/dL (ref 6.1–8.1)

## 2018-06-14 LAB — URINE CYTOLOGY ANCILLARY ONLY
Chlamydia: NEGATIVE
Neisseria Gonorrhea: NEGATIVE

## 2018-06-14 NOTE — Telephone Encounter (Signed)
The patient called in to verify the appointment date and time. Informed of face to face visit tomorrow. The patient verbalized understanding.

## 2018-06-15 ENCOUNTER — Telehealth: Payer: Self-pay | Admitting: Family Medicine

## 2018-06-15 ENCOUNTER — Encounter: Payer: Self-pay | Attending: Obstetrics and Gynecology | Admitting: *Deleted

## 2018-06-15 ENCOUNTER — Ambulatory Visit: Payer: Self-pay | Admitting: *Deleted

## 2018-06-15 ENCOUNTER — Other Ambulatory Visit: Payer: Self-pay

## 2018-06-15 DIAGNOSIS — B20 Human immunodeficiency virus [HIV] disease: Secondary | ICD-10-CM | POA: Insufficient documentation

## 2018-06-15 DIAGNOSIS — O98712 Human immunodeficiency virus [HIV] disease complicating pregnancy, second trimester: Secondary | ICD-10-CM | POA: Insufficient documentation

## 2018-06-15 DIAGNOSIS — O0992 Supervision of high risk pregnancy, unspecified, second trimester: Secondary | ICD-10-CM | POA: Insufficient documentation

## 2018-06-15 DIAGNOSIS — O09522 Supervision of elderly multigravida, second trimester: Secondary | ICD-10-CM | POA: Insufficient documentation

## 2018-06-15 DIAGNOSIS — O10912 Unspecified pre-existing hypertension complicating pregnancy, second trimester: Secondary | ICD-10-CM | POA: Insufficient documentation

## 2018-06-15 DIAGNOSIS — Z3A28 28 weeks gestation of pregnancy: Secondary | ICD-10-CM | POA: Insufficient documentation

## 2018-06-15 DIAGNOSIS — O99212 Obesity complicating pregnancy, second trimester: Secondary | ICD-10-CM | POA: Insufficient documentation

## 2018-06-15 DIAGNOSIS — O24419 Gestational diabetes mellitus in pregnancy, unspecified control: Secondary | ICD-10-CM | POA: Insufficient documentation

## 2018-06-15 NOTE — Telephone Encounter (Signed)
Attempted to contact patient to let her know that her appointment time has changed for 4/29 appointment. No answer, left detailed message with new appointment time and that the appointment will still be done virtually. Advised to give the office a call with any questions or concerns.

## 2018-06-15 NOTE — Telephone Encounter (Signed)
Spoke with patient to let her know I changed her appointment time in error and she will still have her virtual visit at 4:15pm on 4/29. Patient verbalized understanding

## 2018-06-15 NOTE — Progress Notes (Signed)
  Patient was seen on 06/15/2018 for Gestational Diabetes self-management. EDD 09/11/2018. Patient states history of GDM with several pregnancies but she did not test her BG with those. Diet history obtained. Patient eats good variety of all food groups. Beverages include water and unsweetened green tea.  The following learning objectives were met by the patient :   States the definition of Gestational Diabetes  States why dietary management is important in controlling blood glucose  Describes the effects of carbohydrates on blood glucose levels  Demonstrates ability to create a balanced meal plan  Demonstrates carbohydrate counting   States when to check blood glucose levels  Demonstrates proper blood glucose monitoring techniques  States the effect of stress and exercise on blood glucose levels  States the importance of limiting caffeine and abstaining from alcohol and smoking  Plan:  Aim for 3 Carb Choices per meal (45 grams) +/- 1 either way  Aim for 1-2 Carbs per snack Begin reading food labels for Total Carbohydrate of foods If OK with your MD, consider  increasing your activity level by walking, Arm Chair Exercises or other activity daily as tolerated Begin checking BG before breakfast and 2 hours after first bite of breakfast, lunch and dinner as directed by MD  Bring Log Book/Sheet and meter to every medical appointment OR use Baby Scripts (see below) Baby Scripts:  Patient was introduced to [employer] and plans to use as record of BG electronically  Take medication if directed by MD  Blood glucose monitor given: [employer] Code Lot # [id number] Blood glucose reading: 117 mg/dl after food  Patient instructed to monitor glucose levels: FBS: 60 - 95 mg/dl 2 hour: <120 mg/dl  Patient received the following handouts:  Nutrition Diabetes and Pregnancy  Carbohydrate Counting List  BG Log Sheet  Patient will be seen for follow-up as needed.

## 2018-06-16 ENCOUNTER — Ambulatory Visit (INDEPENDENT_AMBULATORY_CARE_PROVIDER_SITE_OTHER): Payer: Self-pay | Admitting: Obstetrics and Gynecology

## 2018-06-16 ENCOUNTER — Encounter: Payer: Self-pay | Admitting: Obstetrics and Gynecology

## 2018-06-16 DIAGNOSIS — O98719 Human immunodeficiency virus [HIV] disease complicating pregnancy, unspecified trimester: Secondary | ICD-10-CM

## 2018-06-16 DIAGNOSIS — O0990 Supervision of high risk pregnancy, unspecified, unspecified trimester: Secondary | ICD-10-CM

## 2018-06-16 DIAGNOSIS — O09523 Supervision of elderly multigravida, third trimester: Secondary | ICD-10-CM

## 2018-06-16 DIAGNOSIS — O10919 Unspecified pre-existing hypertension complicating pregnancy, unspecified trimester: Secondary | ICD-10-CM

## 2018-06-16 DIAGNOSIS — O99210 Obesity complicating pregnancy, unspecified trimester: Secondary | ICD-10-CM

## 2018-06-16 DIAGNOSIS — Z6841 Body Mass Index (BMI) 40.0 and over, adult: Secondary | ICD-10-CM | POA: Insufficient documentation

## 2018-06-16 NOTE — Progress Notes (Signed)
Pt reports blood pressure of 114/67 and pulse 86.

## 2018-06-16 NOTE — Progress Notes (Signed)
TELEHEALTH VIRTUAL OBSTETRICS VISIT ENCOUNTER NOTE  I connected with Ms. Galbreath on 06/16/18 at  4:15 PM EDT by telephone at home and verified that I am speaking with the correct person using two identifiers.   I discussed the limitations, risks, security and privacy concerns of performing an evaluation and management service by telephone and the availability of in person appointments. I also discussed with the patient that there may be a patient responsible charge related to this service. The patient expressed understanding and agreed to proceed.  Subjective:  Courtney Chavez is a 43 y.o. G4P3003 at [redacted]w[redacted]d being followed for ongoing prenatal care.  She is currently monitored for the following issues for this high-risk pregnancy and has Human immunodeficiency virus (HIV) disease ([place]); Hyperlipidemia; Essential hypertension; Obesity in pregnancy; Sebaceous cyst of ear; Insomnia; Vision changes; Supervision of high risk pregnancy, antepartum; Chronic hypertension in pregnancy; HIV infection in mother during pregnancy, antepartum; Advanced maternal age in multigravida; Late prenatal care, antepartum; Gestational diabetes mellitus (GDM); and BMI 40.0-44.9, adult ([place]) on their problem list.  Patient reports no complaints. Reports fetal movement. Denies any contractions, bleeding or leaking of fluid.   The following portions of the patient's history were reviewed and updated as appropriate: allergies, current medications, past family history, past medical history, past social history, past surgical history and problem list.   Objective:  There were no vitals filed for this visit.  General:  Alert, oriented and cooperative.   Mental Status: Normal mood and affect perceived. Normal judgment and thought content.  Rest of physical exam deferred due to type of encounter  Assessment and Plan:  Pregnancy: G4P3003 at [redacted]w[redacted]d 1. Chronic hypertension in pregnancy Continue labetalol 200 bid and low dose  asa. Has growth on 5/4. Baby scripts data reviewed  2. Supervision of high risk pregnancy, antepartum D/w pt more re: BC nv  3. HIV infection in mother during pregnancy, antepartum VL undetectable on 4/24. Followed by Dr. Johnnye Sima. Currently on Truvada  4. Multigravida of advanced maternal age in third trimester No issues  5. BMI 40.0-44.9, adult ([place]) No issues  6. Obesity in pregnancy No issues  7. GDM Saw DM teaching yesterday. AM fasting 100 and 2 h PP around 120. Will have her be seen after her growth u/s next week to review log as may need to start meds then.   Preterm labor symptoms and general obstetric precautions including but not limited to vaginal bleeding, contractions, leaking of fluid and fetal movement were reviewed in detail with the patient.  I discussed the assessment and treatment plan with the patient. The patient was provided an opportunity to ask questions and all were answered. The patient agreed with the plan and demonstrated an understanding of the instructions. The patient was advised to call back or seek an in-person office evaluation/go to [facility]U at [facility] for any urgent or concerning symptoms. Please refer to After Visit Summary for other counseling recommendations.   I provided 15 minutes of non-face-to-face time during this encounter. The visit was conducted via Webex-medicine  Return in about 5 days (around 06/21/2018) for hrob in person visit. already has u/s that day.  Future Appointments  Date Time Provider [place]  06/21/2018  2:00 PM [place] NURSE [place] MFC-US  06/21/2018  2:00 PM [place] 3 WH-MFCUS MFC-US  07/01/2018  3:30 PM Hatcher, Doroteo [place], MD RCID-RCID [place], MD [facility] for [facility], [place]

## 2018-06-21 ENCOUNTER — Other Ambulatory Visit: Payer: Self-pay

## 2018-06-21 ENCOUNTER — Ambulatory Visit (HOSPITAL_COMMUNITY)
Admission: RE | Admit: 2018-06-21 | Discharge: 2018-06-21 | Disposition: A | Discharge status: Home / Self Care | Payer: Self-pay | Source: Ambulatory Visit | Attending: Maternal & Fetal Medicine | Admitting: Maternal & Fetal Medicine

## 2018-06-21 ENCOUNTER — Ambulatory Visit (INDEPENDENT_AMBULATORY_CARE_PROVIDER_SITE_OTHER): Payer: Self-pay | Admitting: Obstetrics & Gynecology

## 2018-06-21 ENCOUNTER — Ambulatory Visit (HOSPITAL_COMMUNITY): Payer: Self-pay | Admitting: *Deleted

## 2018-06-21 ENCOUNTER — Encounter (HOSPITAL_COMMUNITY): Payer: Self-pay

## 2018-06-21 VITALS — BP 126/81 | HR 79 | Temp 98.5°F

## 2018-06-21 DIAGNOSIS — O24410 Gestational diabetes mellitus in pregnancy, diet controlled: Secondary | ICD-10-CM

## 2018-06-21 DIAGNOSIS — O99213 Obesity complicating pregnancy, third trimester: Secondary | ICD-10-CM

## 2018-06-21 DIAGNOSIS — O98719 Human immunodeficiency virus [HIV] disease complicating pregnancy, unspecified trimester: Secondary | ICD-10-CM

## 2018-06-21 DIAGNOSIS — O09523 Supervision of elderly multigravida, third trimester: Secondary | ICD-10-CM | POA: Insufficient documentation

## 2018-06-21 DIAGNOSIS — O0993 Supervision of high risk pregnancy, unspecified, third trimester: Secondary | ICD-10-CM

## 2018-06-21 DIAGNOSIS — O98712 Human immunodeficiency virus [HIV] disease complicating pregnancy, second trimester: Secondary | ICD-10-CM

## 2018-06-21 DIAGNOSIS — Z3A28 28 weeks gestation of pregnancy: Secondary | ICD-10-CM

## 2018-06-21 DIAGNOSIS — O10919 Unspecified pre-existing hypertension complicating pregnancy, unspecified trimester: Secondary | ICD-10-CM

## 2018-06-21 DIAGNOSIS — O10013 Pre-existing essential hypertension complicating pregnancy, third trimester: Secondary | ICD-10-CM

## 2018-06-21 DIAGNOSIS — O0990 Supervision of high risk pregnancy, unspecified, unspecified trimester: Secondary | ICD-10-CM

## 2018-06-21 DIAGNOSIS — O0933 Supervision of pregnancy with insufficient antenatal care, third trimester: Secondary | ICD-10-CM

## 2018-06-21 DIAGNOSIS — O0930 Supervision of pregnancy with insufficient antenatal care, unspecified trimester: Secondary | ICD-10-CM | POA: Insufficient documentation

## 2018-06-21 DIAGNOSIS — O98713 Human immunodeficiency virus [HIV] disease complicating pregnancy, third trimester: Secondary | ICD-10-CM

## 2018-06-21 DIAGNOSIS — Z362 Encounter for other antenatal screening follow-up: Secondary | ICD-10-CM | POA: Insufficient documentation

## 2018-06-21 DIAGNOSIS — O10913 Unspecified pre-existing hypertension complicating pregnancy, third trimester: Secondary | ICD-10-CM

## 2018-06-21 DIAGNOSIS — O99210 Obesity complicating pregnancy, unspecified trimester: Secondary | ICD-10-CM

## 2018-06-21 DIAGNOSIS — O09529 Supervision of elderly multigravida, unspecified trimester: Secondary | ICD-10-CM

## 2018-06-21 MED ORDER — METFORMIN HCL 500 MG PO TABS: ORAL_TABLET | ORAL | 6 refills | Status: DC

## 2018-06-21 NOTE — Progress Notes (Signed)
Active also in Babyscripts with last BP 06/16/18 of 111/77  I connected with  Arlis Rembold on 06/21/18 at  3:35 PM EDT by telephone and verified that I am speaking with the correct person using two identifiers.   I discussed the limitations, risks, security and privacy concerns of performing an evaluation and management service by telephone and the availability of in person appointments. I also discussed with the patient that there may be a patient responsible charge related to this service. The patient expressed understanding and agreed to proceed.  Zakaiya Lares,RN 06/21/2018  3:30 PM

## 2018-06-21 NOTE — Progress Notes (Signed)
cbg's from Babyscripts:

## 2018-06-21 NOTE — Progress Notes (Signed)
TELEHEALTH VIRTUAL OBSTETRICS PRENATAL VISIT ENCOUNTER NOTE  I connected with Courtney Chavez on 06/21/18 at  3:35 PM EDT by WebEx at home and verified that I am speaking with the correct person using two identifiers.   I discussed the limitations, risks, security and privacy concerns of performing an evaluation and management service by telephone and the availability of in person appointments. I also discussed with the patient that there may be a patient responsible charge related to this service. The patient expressed understanding and agreed to proceed. Subjective:  Courtney Chavez is a 43 y.o. [phone] ( at [redacted]w[redacted]d being seen today for ongoing prenatal care.  She is currently monitored for the following i14, 15, and 77 yo kids) issues for this high-risk pregnancy and has Human immunodeficiency virus (HIV) disease ([place]); Hyperlipidemia; Essential hypertension; Obesity in pregnancy; Sebaceous cyst of ear; Insomnia; Vision changes; Supervision of high risk pregnancy, antepartum; Chronic hypertension in pregnancy; HIV infection in mother during pregnancy, antepartum; Advanced maternal age in multigravida; Late prenatal care, antepartum; Gestational diabetes mellitus (GDM); and BMI 40.0-44.9, adult ([place]) on their problem list.  Patient reports no complaints.  Reports fetal movement. Contractions: Not present. Vag. Bleeding: None.  Movement: Present. Denies any contractions, bleeding or leaking of fluid.   The following portions of the patient's history were reviewed and updated as appropriate: allergies, current medications, past family history, past medical history, past social history, past surgical history and problem list.   Objective:  There were no vitals filed for this visit.  Fetal Status:     Movement: Present     General:  Alert, oriented and cooperative. Patient is in no acute distress.  Respiratory: Normal respiratory effort, no problems with respiration noted  Mental Status: Normal  mood and affect. Normal behavior. Normal judgment and thought content.  Rest of physical exam deferred due to type of encounter  Assessment and Plan:  Pregnancy: G4P3003 at [redacted]w[redacted]d 1. Antepartum multigravida of advanced maternal age   55. Diet controlled gestational diabetes mellitus (GDM), antepartum - fastings all in the 100s,  2 hour PCs are all normal - start metformin 500 mg qhs  3. Supervision of high risk pregnancy, antepartum   4. Chronic hypertension in pregnancy   5. HIV infection in mother during pregnancy, antepartum VL undetectable on 4/24. Followed by Dr. Johnnye Sima. Currently on Truvada  6. Obesity in pregnancy No issues 7. Multigravida of advanced maternal age in third trimester   8. Late prenatal care, antepartum   Preterm labor symptoms and general obstetric precautions including but not limited to vaginal bleeding, contractions, leaking of fluid and fetal movement were reviewed in detail with the patient. I discussed the assessment and treatment plan with the patient. The patient was provided an opportunity to ask questions and all were answered. The patient agreed with the plan and demonstrated an understanding of the instructions. The patient was advised to call back or seek an in-person office evaluation/go to MAU at [facility] for any urgent or concerning symptoms. Please refer to After Visit Summary for other counseling recommendations.   I provided 10 minutes of face-to-face via WebEx time during this encounter.  No follow-ups on file.  Future Appointments  Date Time Provider [place]  07/01/2018  3:30 PM Campbell Riches, MD RCID-RCID RCID  07/19/2018  2:45 PM [place] MFC-US  07/19/2018  2:45 PM [place] 2 WH-MFCUS MFC-US  07/26/2018  2:45 PM [place] 2 WH-MFCUS MFC-US    Katrenia Alkins C  Hulan Fray, [place] for [employer], [address]

## 2018-06-22 ENCOUNTER — Other Ambulatory Visit (HOSPITAL_COMMUNITY): Payer: Self-pay | Admitting: *Deleted

## 2018-06-22 DIAGNOSIS — O24415 Gestational diabetes mellitus in pregnancy, controlled by oral hypoglycemic drugs: Secondary | ICD-10-CM

## 2018-06-25 ENCOUNTER — Encounter: Payer: Self-pay | Admitting: Infectious Diseases

## 2018-07-01 ENCOUNTER — Ambulatory Visit (INDEPENDENT_AMBULATORY_CARE_PROVIDER_SITE_OTHER): Payer: Self-pay | Admitting: Infectious Diseases

## 2018-07-01 ENCOUNTER — Encounter: Payer: Self-pay | Admitting: Infectious Diseases

## 2018-07-01 ENCOUNTER — Other Ambulatory Visit: Payer: Self-pay

## 2018-07-01 DIAGNOSIS — O99210 Obesity complicating pregnancy, unspecified trimester: Secondary | ICD-10-CM

## 2018-07-01 DIAGNOSIS — O10919 Unspecified pre-existing hypertension complicating pregnancy, unspecified trimester: Secondary | ICD-10-CM

## 2018-07-01 DIAGNOSIS — B20 Human immunodeficiency virus [HIV] disease: Secondary | ICD-10-CM

## 2018-07-01 DIAGNOSIS — O24410 Gestational diabetes mellitus in pregnancy, diet controlled: Secondary | ICD-10-CM

## 2018-07-01 NOTE — Progress Notes (Signed)
   Subjective:    Patient ID: Courtney Chavez, female    DOB: 1975/03/31, 43 y.o.   MRN: [id number]  HPI 44yo F from [place] came to [place] 2001. HIV+ and HTN.Also hx ofEFV/NVP resistance . Was changed to complerathen to triumeq due to wt gain.  Has 3 sons(09-28-12).They are doing well. She is currently on [place]- truvada. No problems with this. [id number]. She is currently 30 weeks IUP. + fetal movements.  She is on PNV. Also on metformin after failing OGTT.  Would like tubes tied after her pregnancy. She does not think she can do this without insurance.  Mood has been "so-so".  Her other children are doing well.   HIV 1 RNA Quant  Date Value  06/11/2018 <20 NOT DETECTED copies/mL  03/11/2018 <20 NOT DETECTED copies/mL  02/24/2018 CANCELED   CD4 T Cell Abs (/uL)  Date Value  06/11/2018 530  03/11/2018 720  01/20/2018 770   Wt up 40#.  No sick exposures.  Review of Systems  Constitutional: Positive for fatigue. Negative for appetite change, chills, fever and unexpected weight change.  Respiratory: Positive for shortness of breath. Negative for cough.   Gastrointestinal: Negative for constipation and diarrhea.  Genitourinary: Negative for difficulty urinating.  Neurological: Negative for headaches.  Psychiatric/Behavioral: Positive for dysphoric mood.  DOE due to increased abd girth.  Has been drinking more water, eating more fruits.  Please see HPI. All other systems reviewed and negative.     Objective:   Physical Exam Constitutional:      Appearance: Normal appearance.  HENT:     Mouth/Throat:     Mouth: Mucous membranes are moist.     Pharynx: No oropharyngeal exudate.  Eyes:     Extraocular Movements: Extraocular movements intact.     Pupils: Pupils are equal, round, and reactive to light.  Neck:     Musculoskeletal: Normal range of motion and neck supple.  Cardiovascular:     Rate and Rhythm: Normal rate and regular rhythm.  Pulmonary:     Effort:  Pulmonary effort is normal.     Breath sounds: Normal breath sounds.  Abdominal:     General: Bowel sounds are normal. There is distension.     Palpations: Abdomen is soft.  Musculoskeletal:        General: Swelling present.     Right lower leg: Edema present.     Left lower leg: Edema present.  Neurological:     General: No focal deficit present.     Mental Status: She is alert.  Psychiatric:        Mood and Affect: Mood normal.        Assessment & Plan:

## 2018-07-01 NOTE — Assessment & Plan Note (Signed)
She is doing well on her current ART.  Offered/refused condoms.  She has had PCV 23, Flu and Tdap vax.  She needs PCV13. Will defer til post-partum.  Will see her back in 7 weeks for repeat labs.

## 2018-07-01 NOTE — Assessment & Plan Note (Signed)
She has increased BP today but is asx. She does have LE edema.  Will f/u with OB.

## 2018-07-01 NOTE — Assessment & Plan Note (Signed)
Will continue metformin, appreciate OB f/u.

## 2018-07-01 NOTE — Assessment & Plan Note (Signed)
She already has plan on how to lose wt.  Will f/u.

## 2018-07-05 ENCOUNTER — Telehealth (INDEPENDENT_AMBULATORY_CARE_PROVIDER_SITE_OTHER): Payer: Self-pay | Admitting: Obstetrics & Gynecology

## 2018-07-05 ENCOUNTER — Other Ambulatory Visit: Payer: Self-pay

## 2018-07-05 VITALS — Wt 277.0 lb

## 2018-07-05 DIAGNOSIS — O99210 Obesity complicating pregnancy, unspecified trimester: Secondary | ICD-10-CM

## 2018-07-05 DIAGNOSIS — O0990 Supervision of high risk pregnancy, unspecified, unspecified trimester: Secondary | ICD-10-CM

## 2018-07-05 DIAGNOSIS — Z3A30 30 weeks gestation of pregnancy: Secondary | ICD-10-CM

## 2018-07-05 DIAGNOSIS — O0933 Supervision of pregnancy with insufficient antenatal care, third trimester: Secondary | ICD-10-CM

## 2018-07-05 DIAGNOSIS — O09523 Supervision of elderly multigravida, third trimester: Secondary | ICD-10-CM

## 2018-07-05 DIAGNOSIS — O0930 Supervision of pregnancy with insufficient antenatal care, unspecified trimester: Secondary | ICD-10-CM

## 2018-07-05 DIAGNOSIS — O24410 Gestational diabetes mellitus in pregnancy, diet controlled: Secondary | ICD-10-CM

## 2018-07-05 DIAGNOSIS — Z6841 Body Mass Index (BMI) 40.0 and over, adult: Secondary | ICD-10-CM

## 2018-07-05 DIAGNOSIS — O98713 Human immunodeficiency virus [HIV] disease complicating pregnancy, third trimester: Secondary | ICD-10-CM

## 2018-07-05 DIAGNOSIS — O98719 Human immunodeficiency virus [HIV] disease complicating pregnancy, unspecified trimester: Secondary | ICD-10-CM

## 2018-07-05 DIAGNOSIS — O99213 Obesity complicating pregnancy, third trimester: Secondary | ICD-10-CM

## 2018-07-05 NOTE — Progress Notes (Signed)
   PRENATAL VISIT NOTE  Subjective:  Courtney Chavez is a 43 y.o. G4P3003 at [redacted]w[redacted]d being seen today for ongoing prenatal care.  She is currently monitored for the following issues for this high-risk pregnancy and has Human immunodeficiency virus (HIV) disease ([place]); Hyperlipidemia; Essential hypertension; Obesity in pregnancy; Sebaceous cyst of ear; Insomnia; Vision changes; Supervision of high risk pregnancy, antepartum; Chronic hypertension in pregnancy; HIV infection in mother during pregnancy, antepartum; Advanced maternal age in multigravida; Late prenatal care, antepartum; Gestational diabetes mellitus (GDM); and BMI 40.0-44.9, adult ([place]) on their problem list.  Patient reports no complaints.  Contractions: Not present. Vag. Bleeding: None.  Movement: Present. Denies leaking of fluid.   The following portions of the patient's history were reviewed and updated as appropriate: allergies, current medications, past family history, past medical history, past social history, past surgical history and problem list.   Objective:  There were no vitals filed for this visit.  Fetal Status:     Movement: Present     General:  Alert, oriented and cooperative. Patient is in no acute distress.  Skin: Skin is warm and dry. No rash noted.   Cardiovascular: Normal heart rate noted  Respiratory: Normal respiratory effort, no problems with respiration noted  Abdomen: Soft, gravid, appropriate for gestational age.  Pain/Pressure: Present     Pelvic: Cervical exam deferred        Extremities: Normal range of motion.  Edema: Trace  Mental Status: Normal mood and affect. Normal behavior. Normal judgment and thought content.   Assessment and Plan:  Pregnancy: G4P3003 at [redacted]w[redacted]d 1. BMI 40.0-44.9, adult ([place])   2. Late prenatal care, antepartum   3. Multigravida of advanced maternal age in third trimester   4. HIV infection in mother during pregnancy, antepartum - sees ID, last seen last week  5.  Supervision of high risk pregnancy, antepartum   6. Obesity in pregnancy - we discussed risks of excess weight gain including still birth, she saw the nutriitonist  7. Diet controlled gestational diabetes mellitus (GDM) in third trimester - she has only checked her sugars 3 times in the last week. She is reluctant to test due to "It hurts"  Preterm labor symptoms and general obstetric precautions including but not limited to vaginal bleeding, contractions, leaking of fluid and fetal movement were reviewed in detail with the patient. Please refer to After Visit Summary for other counseling recommendations.   No follow-ups on file.  Future Appointments  Date Time Provider [place]  07/19/2018  2:45 PM [place] NURSE [place] MFC-US  07/19/2018  2:45 PM [place] 2 WH-MFCUS MFC-US  07/26/2018  2:45 PM [place] 2 WH-MFCUS MFC-US  08/24/2018  3:30 PM Hatcher, Doroteo [place], MD RCID-RCID RCID    Emily Filbert, MD

## 2018-07-05 NOTE — Progress Notes (Signed)
I connected with  Courtney Chavez on 07/05/18 at  9:55 AM EDT by telephone and verified that I am speaking with the correct person using two identifiers.   I discussed the limitations, risks, security and privacy concerns of performing an evaluation and management service by telephone and the availability of in person appointments. I also discussed with the patient that there may be a patient responsible charge related to this service. The patient expressed understanding and agreed to proceed.  [name] Courtney Chavez, CMA 07/05/2018  9:57 AM    99 Fasting today  Battery low on BP cuff, last checked on Thursday in hospital.

## 2018-07-14 ENCOUNTER — Ambulatory Visit (INDEPENDENT_AMBULATORY_CARE_PROVIDER_SITE_OTHER): Payer: Self-pay | Admitting: Obstetrics and Gynecology

## 2018-07-14 ENCOUNTER — Other Ambulatory Visit: Payer: Self-pay

## 2018-07-14 ENCOUNTER — Ambulatory Visit: Payer: Self-pay

## 2018-07-14 ENCOUNTER — Ambulatory Visit (INDEPENDENT_AMBULATORY_CARE_PROVIDER_SITE_OTHER): Payer: Self-pay | Admitting: *Deleted

## 2018-07-14 ENCOUNTER — Encounter: Payer: Self-pay | Admitting: Obstetrics and Gynecology

## 2018-07-14 VITALS — BP 136/85 | HR 87 | Wt 275.1 lb

## 2018-07-14 DIAGNOSIS — Z3A32 32 weeks gestation of pregnancy: Secondary | ICD-10-CM

## 2018-07-14 DIAGNOSIS — O0990 Supervision of high risk pregnancy, unspecified, unspecified trimester: Secondary | ICD-10-CM

## 2018-07-14 DIAGNOSIS — O98713 Human immunodeficiency virus [HIV] disease complicating pregnancy, third trimester: Secondary | ICD-10-CM

## 2018-07-14 DIAGNOSIS — O10919 Unspecified pre-existing hypertension complicating pregnancy, unspecified trimester: Secondary | ICD-10-CM

## 2018-07-14 DIAGNOSIS — O24415 Gestational diabetes mellitus in pregnancy, controlled by oral hypoglycemic drugs: Secondary | ICD-10-CM

## 2018-07-14 DIAGNOSIS — O10913 Unspecified pre-existing hypertension complicating pregnancy, third trimester: Secondary | ICD-10-CM

## 2018-07-14 DIAGNOSIS — O09523 Supervision of elderly multigravida, third trimester: Secondary | ICD-10-CM

## 2018-07-14 DIAGNOSIS — O98719 Human immunodeficiency virus [HIV] disease complicating pregnancy, unspecified trimester: Secondary | ICD-10-CM

## 2018-07-14 NOTE — Progress Notes (Signed)
Subjective:  Courtney Chavez is a 43 y.o. G4P3003 at [redacted]w[redacted]d being seen today for ongoing prenatal care.  She is currently monitored for the following issues for this high-risk pregnancy and has Human immunodeficiency virus (HIV) disease ([place]); Hyperlipidemia; Essential hypertension; Obesity in pregnancy; Sebaceous cyst of ear; Insomnia; Vision changes; Supervision of high risk pregnancy, antepartum; Chronic hypertension in pregnancy; HIV infection in mother during pregnancy, antepartum; Advanced maternal age in multigravida; Late prenatal care, antepartum; Gestational diabetes mellitus (GDM); and BMI 40.0-44.9, adult ([place]) on their problem list.  Patient reports no complaints.  Contractions: Not present. Vag. Bleeding: None.  Movement: Present. Denies leaking of fluid.   The following portions of the patient's history were reviewed and updated as appropriate: allergies, current medications, past family history, past medical history, past social history, past surgical history and problem list. Problem list updated.  Objective:   Vitals:   07/14/18 1621  BP: 136/85  Pulse: 87  Weight: 275 lb 1.6 oz (124.8 kg)    Fetal Status:     Movement: Present     General:  Alert, oriented and cooperative. Patient is in no acute distress.  Skin: Skin is warm and dry. No rash noted.   Cardiovascular: Normal heart rate noted  Respiratory: Normal respiratory effort, no problems with respiration noted  Abdomen: Soft, gravid, appropriate for gestational age. Pain/Pressure: Present     Pelvic:  Cervical exam deferred        Extremities: Normal range of motion.     Mental Status: Normal mood and affect. Normal behavior. Normal judgment and thought content.   Urinalysis:      Assessment and Plan:  Pregnancy: G4P3003 at [redacted]w[redacted]d  1. Supervision of high risk pregnancy, antepartum Stable  2. Chronic hypertension in pregnancy BP stable Continue with current Tx BPP 10/10 today Continue with weekly BPP with  MFM Growth scan next week  3. Multigravida of advanced maternal age in third trimester   4. HIV infection in mother during pregnancy, antepartum Stable Followed by ID  5. Gestational diabetes mellitus (GDM) in third trimester controlled on oral hypoglycemic drug CBG's in goal range but not checking qid Times to check CBG's reviewed with pt Continue with Metformin  Preterm labor symptoms and general obstetric precautions including but not limited to vaginal bleeding, contractions, leaking of fluid and fetal movement were reviewed in detail with the patient. Please refer to After Visit Summary for other counseling recommendations.  Return in about 2 weeks (around 07/28/2018) for OB visit, virtual.   Chancy Milroy, MD

## 2018-07-14 NOTE — Patient Instructions (Signed)
Third Trimester of Pregnancy The third trimester is from week 28 through week 40 (months 7 through 9). The third trimester is a time when the unborn baby (fetus) is growing rapidly. At the end of the ninth month, the fetus is about 20 inches in length and weighs 6-10 pounds. Body changes during your third trimester Your body will continue to go through many changes during pregnancy. The changes vary from woman to woman. During the third trimester:  Your weight will continue to increase. You can expect to gain 25-35 pounds (11-16 kg) by the end of the pregnancy.  You may begin to get stretch marks on your hips, abdomen, and breasts.  You may urinate more often because the fetus is moving lower into your pelvis and pressing on your bladder.  You may develop or continue to have heartburn. This is caused by increased hormones that slow down muscles in the digestive tract.  You may develop or continue to have constipation because increased hormones slow digestion and cause the muscles that push waste through your intestines to relax.  You may develop hemorrhoids. These are swollen veins (varicose veins) in the rectum that can itch or be painful.  You may develop swollen, bulging veins (varicose veins) in your legs.  You may have increased body aches in the pelvis, back, or thighs. This is due to weight gain and increased hormones that are relaxing your joints.  You may have changes in your hair. These can include thickening of your hair, rapid growth, and changes in texture. Some women also have hair loss during or after pregnancy, or hair that feels dry or thin. Your hair will most likely return to normal after your baby is born.  Your breasts will continue to grow and they will continue to become tender. A yellow fluid (colostrum) may leak from your breasts. This is the first milk you are producing for your baby.  Your belly button may stick out.  You may notice more swelling in your hands,  face, or ankles.  You may have increased tingling or numbness in your hands, arms, and legs. The skin on your belly may also feel numb.  You may feel short of breath because of your expanding uterus.  You may have more problems sleeping. This can be caused by the size of your belly, increased need to urinate, and an increase in your body's metabolism.  You may notice the fetus "dropping," or moving lower in your abdomen (lightening).  You may have increased vaginal discharge.  You may notice your joints feel loose and you may have pain around your pelvic bone. What to expect at prenatal visits You will have prenatal exams every 2 weeks until week 36. Then you will have weekly prenatal exams. During a routine prenatal visit:  You will be weighed to make sure you and the baby are growing normally.  Your blood pressure will be taken.  Your abdomen will be measured to track your baby's growth.  The fetal heartbeat will be listened to.  Any test results from the previous visit will be discussed.  You may have a cervical check near your due date to see if your cervix has softened or thinned (effaced).  You will be tested for Group B streptococcus. This happens between 35 and 37 weeks. Your health care provider may ask you:  What your birth plan is.  How you are feeling.  If you are feeling the baby move.  If you have had any abnormal   symptoms, such as leaking fluid, bleeding, severe headaches, or abdominal cramping.  If you are using any tobacco products, including cigarettes, chewing tobacco, and electronic cigarettes.  If you have any questions. Other tests or screenings that may be performed during your third trimester include:  Blood tests that check for low iron levels (anemia).  Fetal testing to check the health, activity level, and growth of the fetus. Testing is done if you have certain medical conditions or if there are problems during the pregnancy.  Nonstress test  (NST). This test checks the health of your baby to make sure there are no signs of problems, such as the baby not getting enough oxygen. During this test, a belt is placed around your belly. The baby is made to move, and its heart rate is monitored during movement. What is false labor? False labor is a condition in which you feel small, irregular tightenings of the muscles in the womb (contractions) that usually go away with rest, changing position, or drinking water. These are called Braxton Hicks contractions. Contractions may last for hours, days, or even weeks before true labor sets in. If contractions come at regular intervals, become more frequent, increase in intensity, or become painful, you should see your health care provider. What are the signs of labor?  Abdominal cramps.  Regular contractions that start at 10 minutes apart and become stronger and more frequent with time.  Contractions that start on the top of the uterus and spread down to the lower abdomen and back.  Increased pelvic pressure and dull back pain.  A watery or bloody mucus discharge that comes from the vagina.  Leaking of amniotic fluid. This is also known as your "water breaking." It could be a slow trickle or a gush. Let your health care provider know if it has a color or strange odor. If you have any of these signs, call your health care provider right away, even if it is before your due date. Follow these instructions at home: Medicines  Follow your health care provider's instructions regarding medicine use. Specific medicines may be either safe or unsafe to take during pregnancy.  Take a prenatal vitamin that contains at least 600 micrograms (mcg) of folic acid.  If you develop constipation, try taking a stool softener if your health care provider approves. Eating and drinking   Eat a balanced diet that includes fresh fruits and vegetables, whole grains, good sources of protein such as meat, eggs, or tofu,  and low-fat dairy. Your health care provider will help you determine the amount of weight gain that is right for you.  Avoid raw meat and uncooked cheese. These carry germs that can cause birth defects in the baby.  If you have low calcium intake from food, talk to your health care provider about whether you should take a daily calcium supplement.  Eat four or five small meals rather than three large meals a day.  Limit foods that are high in fat and processed sugars, such as fried and sweet foods.  To prevent constipation: ? Drink enough fluid to keep your urine clear or pale yellow. ? Eat foods that are high in fiber, such as fresh fruits and vegetables, whole grains, and beans. Activity  Exercise only as directed by your health care provider. Most women can continue their usual exercise routine during pregnancy. Try to exercise for 30 minutes at least 5 days a week. Stop exercising if you experience uterine contractions.  Avoid heavy lifting.  Do   not exercise in extreme heat or humidity, or at high altitudes.  Wear low-heel, comfortable shoes.  Practice good posture.  You may continue to have sex unless your health care provider tells you otherwise. Relieving pain and discomfort  Take frequent breaks and rest with your legs elevated if you have leg cramps or low back pain.  Take warm sitz baths to soothe any pain or discomfort caused by hemorrhoids. Use hemorrhoid cream if your health care provider approves.  Wear a good support bra to prevent discomfort from breast tenderness.  If you develop varicose veins: ? Wear support pantyhose or compression stockings as told by your healthcare provider. ? Elevate your feet for 15 minutes, 3-4 times a day. Prenatal care  Write down your questions. Take them to your prenatal visits.  Keep all your prenatal visits as told by your health care provider. This is important. Safety  Wear your seat belt at all times when driving.  Make  a list of emergency phone numbers, including numbers for family, friends, the hospital, and police and fire departments. General instructions  Avoid cat litter boxes and soil used by cats. These carry germs that can cause birth defects in the baby. If you have a cat, ask someone to clean the litter box for you.  Do not travel far distances unless it is absolutely necessary and only with the approval of your health care provider.  Do not use hot tubs, steam rooms, or saunas.  Do not drink alcohol.  Do not use any products that contain nicotine or tobacco, such as cigarettes and e-cigarettes. If you need help quitting, ask your health care provider.  Do not use any medicinal herbs or unprescribed drugs. These chemicals affect the formation and growth of the baby.  Do not douche or use tampons or scented sanitary pads.  Do not cross your legs for long periods of time.  To prepare for the arrival of your baby: ? Take prenatal classes to understand, practice, and ask questions about labor and delivery. ? Make a trial run to the hospital. ? Visit the hospital and tour the maternity area. ? Arrange for maternity or paternity leave through employers. ? Arrange for family and friends to take care of pets while you are in the hospital. ? Purchase a rear-facing car seat and make sure you know how to install it in your car. ? Pack your hospital bag. ? Prepare the baby's nursery. Make sure to remove all pillows and stuffed animals from the baby's crib to prevent suffocation.  Visit your dentist if you have not gone during your pregnancy. Use a soft toothbrush to brush your teeth and be gentle when you floss. Contact a health care provider if:  You are unsure if you are in labor or if your water has broken.  You become dizzy.  You have mild pelvic cramps, pelvic pressure, or nagging pain in your abdominal area.  You have lower back pain.  You have persistent nausea, vomiting, or  diarrhea.  You have an unusual or bad smelling vaginal discharge.  You have pain when you urinate. Get help right away if:  Your water breaks before 37 weeks.  You have regular contractions less than 5 minutes apart before 37 weeks.  You have a fever.  You are leaking fluid from your vagina.  You have spotting or bleeding from your vagina.  You have severe abdominal pain or cramping.  You have rapid weight loss or weight gain.  You have   shortness of breath with chest pain.  You notice sudden or extreme swelling of your face, hands, ankles, feet, or legs.  Your baby makes fewer than 10 movements in 2 hours.  You have severe headaches that do not go away when you take medicine.  You have vision changes. Summary  The third trimester is from week 28 through week 40, months 7 through 9. The third trimester is a time when the unborn baby (fetus) is growing rapidly.  During the third trimester, your discomfort may increase as you and your baby continue to gain weight. You may have abdominal, leg, and back pain, sleeping problems, and an increased need to urinate.  During the third trimester your breasts will keep growing and they will continue to become tender. A yellow fluid (colostrum) may leak from your breasts. This is the first milk you are producing for your baby.  False labor is a condition in which you feel small, irregular tightenings of the muscles in the womb (contractions) that eventually go away. These are called Braxton Hicks contractions. Contractions may last for hours, days, or even weeks before true labor sets in.  Signs of labor can include: abdominal cramps; regular contractions that start at 10 minutes apart and become stronger and more frequent with time; watery or bloody mucus discharge that comes from the vagina; increased pelvic pressure and dull back pain; and leaking of amniotic fluid. This information is not intended to replace advice given to you by your  health care provider. Make sure you discuss any questions you have with your health care provider. Document Released: 01/28/2001 Document Revised: 03/11/2016 Document Reviewed: 03/11/2016 Elsevier Interactive Patient Education  2019 [employer]  

## 2018-07-14 NOTE — Progress Notes (Signed)
Pt has [place] for growth/BPP @ [facility] on 6/1, then weekly BPP @ [facility].

## 2018-07-19 ENCOUNTER — Encounter (HOSPITAL_COMMUNITY): Payer: Self-pay

## 2018-07-19 ENCOUNTER — Other Ambulatory Visit: Payer: Self-pay

## 2018-07-19 ENCOUNTER — Ambulatory Visit (HOSPITAL_COMMUNITY): Payer: Self-pay | Admitting: *Deleted

## 2018-07-19 ENCOUNTER — Other Ambulatory Visit: Payer: Self-pay | Admitting: Infectious Diseases

## 2018-07-19 ENCOUNTER — Ambulatory Visit (HOSPITAL_COMMUNITY)
Admission: RE | Admit: 2018-07-19 | Discharge: 2018-07-19 | Disposition: A | Discharge status: Home / Self Care | Payer: Self-pay | Source: Ambulatory Visit | Attending: Obstetrics and Gynecology | Admitting: Obstetrics and Gynecology

## 2018-07-19 VITALS — BP 122/76 | HR 84 | Temp 98.5°F

## 2018-07-19 DIAGNOSIS — O10919 Unspecified pre-existing hypertension complicating pregnancy, unspecified trimester: Secondary | ICD-10-CM

## 2018-07-19 DIAGNOSIS — O99213 Obesity complicating pregnancy, third trimester: Secondary | ICD-10-CM

## 2018-07-19 DIAGNOSIS — O0990 Supervision of high risk pregnancy, unspecified, unspecified trimester: Secondary | ICD-10-CM

## 2018-07-19 DIAGNOSIS — O09523 Supervision of elderly multigravida, third trimester: Secondary | ICD-10-CM | POA: Insufficient documentation

## 2018-07-19 DIAGNOSIS — O0930 Supervision of pregnancy with insufficient antenatal care, unspecified trimester: Secondary | ICD-10-CM

## 2018-07-19 DIAGNOSIS — O10013 Pre-existing essential hypertension complicating pregnancy, third trimester: Secondary | ICD-10-CM

## 2018-07-19 DIAGNOSIS — O24415 Gestational diabetes mellitus in pregnancy, controlled by oral hypoglycemic drugs: Secondary | ICD-10-CM | POA: Insufficient documentation

## 2018-07-19 DIAGNOSIS — O24410 Gestational diabetes mellitus in pregnancy, diet controlled: Secondary | ICD-10-CM

## 2018-07-19 DIAGNOSIS — O98719 Human immunodeficiency virus [HIV] disease complicating pregnancy, unspecified trimester: Secondary | ICD-10-CM

## 2018-07-19 DIAGNOSIS — O0933 Supervision of pregnancy with insufficient antenatal care, third trimester: Secondary | ICD-10-CM

## 2018-07-19 DIAGNOSIS — Z362 Encounter for other antenatal screening follow-up: Secondary | ICD-10-CM

## 2018-07-19 DIAGNOSIS — O98713 Human immunodeficiency virus [HIV] disease complicating pregnancy, third trimester: Secondary | ICD-10-CM

## 2018-07-19 DIAGNOSIS — Z3A32 32 weeks gestation of pregnancy: Secondary | ICD-10-CM

## 2018-07-26 ENCOUNTER — Ambulatory Visit (HOSPITAL_COMMUNITY): Payer: Self-pay | Admitting: *Deleted

## 2018-07-26 ENCOUNTER — Ambulatory Visit (HOSPITAL_COMMUNITY)
Admission: RE | Admit: 2018-07-26 | Discharge: 2018-07-26 | Disposition: A | Discharge status: Home / Self Care | Payer: Self-pay | Source: Ambulatory Visit | Attending: Obstetrics and Gynecology | Admitting: Obstetrics and Gynecology

## 2018-07-26 ENCOUNTER — Other Ambulatory Visit (HOSPITAL_COMMUNITY): Payer: Self-pay | Admitting: *Deleted

## 2018-07-26 ENCOUNTER — Encounter (HOSPITAL_COMMUNITY): Payer: Self-pay | Admitting: *Deleted

## 2018-07-26 ENCOUNTER — Other Ambulatory Visit: Payer: Self-pay

## 2018-07-26 DIAGNOSIS — O0930 Supervision of pregnancy with insufficient antenatal care, unspecified trimester: Secondary | ICD-10-CM | POA: Insufficient documentation

## 2018-07-26 DIAGNOSIS — O0990 Supervision of high risk pregnancy, unspecified, unspecified trimester: Secondary | ICD-10-CM | POA: Insufficient documentation

## 2018-07-26 DIAGNOSIS — O10013 Pre-existing essential hypertension complicating pregnancy, third trimester: Secondary | ICD-10-CM

## 2018-07-26 DIAGNOSIS — O98719 Human immunodeficiency virus [HIV] disease complicating pregnancy, unspecified trimester: Secondary | ICD-10-CM | POA: Insufficient documentation

## 2018-07-26 DIAGNOSIS — O10919 Unspecified pre-existing hypertension complicating pregnancy, unspecified trimester: Secondary | ICD-10-CM

## 2018-07-26 DIAGNOSIS — O09523 Supervision of elderly multigravida, third trimester: Secondary | ICD-10-CM

## 2018-07-26 DIAGNOSIS — O24410 Gestational diabetes mellitus in pregnancy, diet controlled: Secondary | ICD-10-CM | POA: Insufficient documentation

## 2018-07-26 DIAGNOSIS — O0933 Supervision of pregnancy with insufficient antenatal care, third trimester: Secondary | ICD-10-CM

## 2018-07-26 DIAGNOSIS — Z3A33 33 weeks gestation of pregnancy: Secondary | ICD-10-CM

## 2018-07-26 DIAGNOSIS — O24415 Gestational diabetes mellitus in pregnancy, controlled by oral hypoglycemic drugs: Secondary | ICD-10-CM | POA: Insufficient documentation

## 2018-07-26 DIAGNOSIS — O98713 Human immunodeficiency virus [HIV] disease complicating pregnancy, third trimester: Secondary | ICD-10-CM

## 2018-07-26 DIAGNOSIS — O99213 Obesity complicating pregnancy, third trimester: Secondary | ICD-10-CM

## 2018-07-27 ENCOUNTER — Telehealth: Payer: Self-pay | Admitting: Family Medicine

## 2018-07-27 NOTE — Telephone Encounter (Signed)
Patient aware of Mychart visit.

## 2018-07-28 ENCOUNTER — Encounter: Payer: Self-pay | Admitting: Obstetrics and Gynecology

## 2018-07-28 ENCOUNTER — Telehealth (INDEPENDENT_AMBULATORY_CARE_PROVIDER_SITE_OTHER): Payer: Self-pay | Admitting: Obstetrics and Gynecology

## 2018-07-28 ENCOUNTER — Other Ambulatory Visit: Payer: Self-pay

## 2018-07-28 VITALS — BP 135/88 | HR 89

## 2018-07-28 DIAGNOSIS — O10919 Unspecified pre-existing hypertension complicating pregnancy, unspecified trimester: Secondary | ICD-10-CM

## 2018-07-28 DIAGNOSIS — O24415 Gestational diabetes mellitus in pregnancy, controlled by oral hypoglycemic drugs: Secondary | ICD-10-CM

## 2018-07-28 DIAGNOSIS — O98713 Human immunodeficiency virus [HIV] disease complicating pregnancy, third trimester: Secondary | ICD-10-CM

## 2018-07-28 DIAGNOSIS — O09523 Supervision of elderly multigravida, third trimester: Secondary | ICD-10-CM

## 2018-07-28 DIAGNOSIS — O10913 Unspecified pre-existing hypertension complicating pregnancy, third trimester: Secondary | ICD-10-CM

## 2018-07-28 DIAGNOSIS — Z3A34 34 weeks gestation of pregnancy: Secondary | ICD-10-CM

## 2018-07-28 DIAGNOSIS — O0933 Supervision of pregnancy with insufficient antenatal care, third trimester: Secondary | ICD-10-CM

## 2018-07-28 DIAGNOSIS — I10 Essential (primary) hypertension: Secondary | ICD-10-CM

## 2018-07-28 DIAGNOSIS — O0930 Supervision of pregnancy with insufficient antenatal care, unspecified trimester: Secondary | ICD-10-CM

## 2018-07-28 DIAGNOSIS — Z3009 Encounter for other general counseling and advice on contraception: Secondary | ICD-10-CM | POA: Insufficient documentation

## 2018-07-28 DIAGNOSIS — O98719 Human immunodeficiency virus [HIV] disease complicating pregnancy, unspecified trimester: Secondary | ICD-10-CM

## 2018-07-28 DIAGNOSIS — O0990 Supervision of high risk pregnancy, unspecified, unspecified trimester: Secondary | ICD-10-CM

## 2018-07-28 DIAGNOSIS — O0993 Supervision of high risk pregnancy, unspecified, third trimester: Secondary | ICD-10-CM

## 2018-07-28 NOTE — Progress Notes (Signed)
[facility] VIRTUAL VIDEO VISIT ENCOUNTER NOTE  Provider location: Center for [employer] at [facility]   I connected with Courtney Chavez on 07/28/18 at  3:55 PM EDT by WebEx Video Encounter at home and verified that I am speaking with the correct person using two identifiers.   I discussed the limitations, risks, security and privacy concerns of performing an evaluation and management service by telephone and the availability of in person appointments. I also discussed with the patient that there may be a patient responsible charge related to this service. The patient expressed understanding and agreed to proceed. Subjective:  Courtney Chavez is a 43 y.o. G4P3003 at [redacted]w[redacted]d being seen today for ongoing prenatal care.  She is currently monitored for the following issues for this high-risk pregnancy and has Human immunodeficiency virus (HIV) disease ([place]); Hyperlipidemia; Essential hypertension; Obesity in pregnancy; Sebaceous cyst of ear; Insomnia; Vision changes; Supervision of high risk pregnancy, antepartum; Chronic hypertension in pregnancy; HIV infection in mother during pregnancy, antepartum; Advanced maternal age in multigravida; Late prenatal care, antepartum; Gestational diabetes mellitus (GDM); BMI 40.0-44.9, adult ([place]); and Unwanted fertility on their problem list.  Patient reports swelling in feet.  Contractions: Not present. Vag. Bleeding: None.  Movement: Present. Denies any leaking of fluid.   The following portions of the patient's history were reviewed and updated as appropriate: allergies, current medications, past family history, past medical history, past social history, past surgical history and problem list.   Objective:   Vitals:   07/28/18 1622  BP: 135/88  Pulse: 89    Fetal Status:     Movement: Present     General:  Alert, oriented and cooperative. Patient is in no acute distress.  Respiratory: Normal respiratory effort, no problems  with respiration noted  Mental Status: Normal mood and affect. Normal behavior. Normal judgment and thought content.  Rest of physical exam deferred due to type of encounter  Imaging:   Assessment and Plan:  Pregnancy: G4P3003 at [redacted]w[redacted]d  1. Oral controlled gestational diabetes mellitus (GDM) in third trimester On metformin 500 QHS FG: 90, reports never higher than 90 PP: 80-90s, reports all are in range  2. Late prenatal care, antepartum  3. Supervision of high risk pregnancy, antepartum  4. Chronic hypertension in pregnancy Labetalol 200 mg BID Cont baby aspirin Weekly BPP Does not have BP cuff today  5. HIV infection in mother during pregnancy, antepartum Last VL undetectable Repeat 36 weeks  6. Multigravida of advanced maternal age in third trimester  7. Unwanted fertility To come sign BTL papers at 3 pm tomorrow    Preterm labor symptoms and general obstetric precautions including but not limited to vaginal bleeding, contractions, leaking of fluid and fetal movement were reviewed in detail with the patient. I discussed the assessment and treatment plan with the patient. The patient was provided an opportunity to ask questions and all were answered. The patient agreed with the plan and demonstrated an understanding of the instructions. The patient was advised to call back or seek an in-person office evaluation/go to [facility] at [facility] for any urgent or concerning symptoms. Please refer to After Visit Summary for other counseling recommendations.   I provided 16 minutes of face-to-face time during this encounter.  Return in about 2 weeks (around 08/11/2018) for OB visit (MD), in person.  Future Appointments  Date Time Provider [place]  08/02/2018  2:45 PM [place] NURSE [place] [facility]  08/02/2018  2:45 PM Kathleen [place] 2  [facility]  08/09/2018  2:45 PM [place] NURSE [place] [facility]  08/09/2018  2:45 PM [place] 2 [facility]  08/16/2018  2:45 PM  [place] NURSE [place] [facility]  08/16/2018  2:45 PM [place] 2 [facility]  08/24/2018  3:30 PM Hatcher, Doroteo Bradford, MD [facility] [place], [place] for [place], [place]

## 2018-07-28 NOTE — Progress Notes (Signed)
Pt states that she cannot locate her blood pressure cuff at the time of visit. Unable to obtain blood pressure.

## 2018-07-29 ENCOUNTER — Encounter: Payer: Self-pay | Admitting: *Deleted

## 2018-07-29 NOTE — Progress Notes (Signed)
Pt stopped at the clinic to sign BTL papers but pt does not have Medicaid or any other insurance and does not need to sign them.  Informed pt.

## 2018-08-02 ENCOUNTER — Other Ambulatory Visit: Payer: Self-pay

## 2018-08-02 ENCOUNTER — Encounter (HOSPITAL_COMMUNITY): Payer: Self-pay | Admitting: *Deleted

## 2018-08-02 ENCOUNTER — Ambulatory Visit (HOSPITAL_COMMUNITY): Payer: Self-pay | Admitting: *Deleted

## 2018-08-02 ENCOUNTER — Ambulatory Visit (HOSPITAL_COMMUNITY)
Admission: RE | Admit: 2018-08-02 | Discharge: 2018-08-02 | Disposition: A | Discharge status: Home / Self Care | Payer: Self-pay | Source: Ambulatory Visit | Attending: Obstetrics and Gynecology | Admitting: Obstetrics and Gynecology

## 2018-08-02 DIAGNOSIS — O24415 Gestational diabetes mellitus in pregnancy, controlled by oral hypoglycemic drugs: Secondary | ICD-10-CM | POA: Insufficient documentation

## 2018-08-02 DIAGNOSIS — O99210 Obesity complicating pregnancy, unspecified trimester: Secondary | ICD-10-CM | POA: Insufficient documentation

## 2018-08-02 DIAGNOSIS — O98719 Human immunodeficiency virus [HIV] disease complicating pregnancy, unspecified trimester: Secondary | ICD-10-CM

## 2018-08-02 DIAGNOSIS — O0990 Supervision of high risk pregnancy, unspecified, unspecified trimester: Secondary | ICD-10-CM

## 2018-08-02 DIAGNOSIS — O98713 Human immunodeficiency virus [HIV] disease complicating pregnancy, third trimester: Secondary | ICD-10-CM

## 2018-08-02 DIAGNOSIS — O0933 Supervision of pregnancy with insufficient antenatal care, third trimester: Secondary | ICD-10-CM

## 2018-08-02 DIAGNOSIS — O10013 Pre-existing essential hypertension complicating pregnancy, third trimester: Secondary | ICD-10-CM

## 2018-08-02 DIAGNOSIS — Z3009 Encounter for other general counseling and advice on contraception: Secondary | ICD-10-CM

## 2018-08-02 DIAGNOSIS — O10919 Unspecified pre-existing hypertension complicating pregnancy, unspecified trimester: Secondary | ICD-10-CM | POA: Insufficient documentation

## 2018-08-02 DIAGNOSIS — O09523 Supervision of elderly multigravida, third trimester: Secondary | ICD-10-CM | POA: Insufficient documentation

## 2018-08-02 DIAGNOSIS — Z3A34 34 weeks gestation of pregnancy: Secondary | ICD-10-CM

## 2018-08-02 DIAGNOSIS — Z6841 Body Mass Index (BMI) 40.0 and over, adult: Secondary | ICD-10-CM | POA: Insufficient documentation

## 2018-08-02 DIAGNOSIS — O0930 Supervision of pregnancy with insufficient antenatal care, unspecified trimester: Secondary | ICD-10-CM | POA: Insufficient documentation

## 2018-08-02 DIAGNOSIS — O24410 Gestational diabetes mellitus in pregnancy, diet controlled: Secondary | ICD-10-CM | POA: Insufficient documentation

## 2018-08-02 DIAGNOSIS — O99213 Obesity complicating pregnancy, third trimester: Secondary | ICD-10-CM

## 2018-08-04 ENCOUNTER — Telehealth: Payer: Self-pay | Admitting: General Practice

## 2018-08-04 DIAGNOSIS — O10919 Unspecified pre-existing hypertension complicating pregnancy, unspecified trimester: Secondary | ICD-10-CM

## 2018-08-04 NOTE — Telephone Encounter (Signed)
Received an alert from [employer] due to elevated blood pressures. Blood pressures 151/80, 146/97, & 146/97. Per chart review, patient is on labetalol 200mg  BID. Hx of Chronic HTN. Patient saw Dr Rosana Hoes on 6/10 for OB visit. Reviewed with Dr Rosana Hoes who states patient can continue to monitor BP at home- no intervention needed at this time.

## 2018-08-09 ENCOUNTER — Encounter (HOSPITAL_COMMUNITY): Payer: Self-pay | Admitting: *Deleted

## 2018-08-09 ENCOUNTER — Ambulatory Visit (HOSPITAL_COMMUNITY): Payer: Self-pay | Admitting: *Deleted

## 2018-08-09 ENCOUNTER — Other Ambulatory Visit: Payer: Self-pay

## 2018-08-09 ENCOUNTER — Ambulatory Visit (HOSPITAL_COMMUNITY)
Admission: RE | Admit: 2018-08-09 | Discharge: 2018-08-09 | Disposition: A | Discharge status: Home / Self Care | Payer: Self-pay | Source: Ambulatory Visit | Attending: Obstetrics and Gynecology | Admitting: Obstetrics and Gynecology

## 2018-08-09 DIAGNOSIS — Z3009 Encounter for other general counseling and advice on contraception: Secondary | ICD-10-CM

## 2018-08-09 DIAGNOSIS — O98713 Human immunodeficiency virus [HIV] disease complicating pregnancy, third trimester: Secondary | ICD-10-CM

## 2018-08-09 DIAGNOSIS — O98719 Human immunodeficiency virus [HIV] disease complicating pregnancy, unspecified trimester: Secondary | ICD-10-CM | POA: Insufficient documentation

## 2018-08-09 DIAGNOSIS — O10919 Unspecified pre-existing hypertension complicating pregnancy, unspecified trimester: Secondary | ICD-10-CM

## 2018-08-09 DIAGNOSIS — O24415 Gestational diabetes mellitus in pregnancy, controlled by oral hypoglycemic drugs: Secondary | ICD-10-CM | POA: Insufficient documentation

## 2018-08-09 DIAGNOSIS — O0990 Supervision of high risk pregnancy, unspecified, unspecified trimester: Secondary | ICD-10-CM | POA: Insufficient documentation

## 2018-08-09 DIAGNOSIS — O10013 Pre-existing essential hypertension complicating pregnancy, third trimester: Secondary | ICD-10-CM

## 2018-08-09 DIAGNOSIS — O0933 Supervision of pregnancy with insufficient antenatal care, third trimester: Secondary | ICD-10-CM

## 2018-08-09 DIAGNOSIS — O0930 Supervision of pregnancy with insufficient antenatal care, unspecified trimester: Secondary | ICD-10-CM

## 2018-08-09 DIAGNOSIS — O99213 Obesity complicating pregnancy, third trimester: Secondary | ICD-10-CM

## 2018-08-09 DIAGNOSIS — Z3A35 35 weeks gestation of pregnancy: Secondary | ICD-10-CM

## 2018-08-09 DIAGNOSIS — O09523 Supervision of elderly multigravida, third trimester: Secondary | ICD-10-CM | POA: Insufficient documentation

## 2018-08-10 ENCOUNTER — Telehealth: Payer: Self-pay | Admitting: Medical

## 2018-08-10 NOTE — Telephone Encounter (Signed)
The patient called in regards to the upcoming appointment to confirm. Informed the patient of wearing a face mask, sanitizing hands upon entering the office, and no visitors or children are allowed in the office due to the [place] restrictions. The patient verbalized understanding.

## 2018-08-12 ENCOUNTER — Encounter: Payer: Self-pay | Admitting: Family Medicine

## 2018-08-12 ENCOUNTER — Telehealth: Payer: Self-pay | Admitting: Family Medicine

## 2018-08-12 NOTE — Telephone Encounter (Signed)
The patient called in to schedule an upcoming appointment. Informed the patient of the restrictions due to [place]. Informed the patient of the wearing a mask, no visitors or children are allowed in the office during the visit. Informed the patient of the of sanitizing hands at the sanitizing station.

## 2018-08-16 ENCOUNTER — Ambulatory Visit (HOSPITAL_COMMUNITY): Payer: Self-pay | Admitting: *Deleted

## 2018-08-16 ENCOUNTER — Encounter: Payer: Self-pay | Admitting: Obstetrics and Gynecology

## 2018-08-16 ENCOUNTER — Ambulatory Visit (INDEPENDENT_AMBULATORY_CARE_PROVIDER_SITE_OTHER): Payer: Self-pay | Admitting: Obstetrics and Gynecology

## 2018-08-16 ENCOUNTER — Other Ambulatory Visit: Payer: Self-pay

## 2018-08-16 ENCOUNTER — Ambulatory Visit (HOSPITAL_COMMUNITY)
Admission: RE | Admit: 2018-08-16 | Discharge: 2018-08-16 | Disposition: A | Discharge status: Home / Self Care | Payer: Self-pay | Source: Ambulatory Visit | Attending: Obstetrics and Gynecology | Admitting: Obstetrics and Gynecology

## 2018-08-16 VITALS — BP 149/86 | HR 92 | Temp 99.2°F | Wt 280.1 lb

## 2018-08-16 DIAGNOSIS — Z3009 Encounter for other general counseling and advice on contraception: Secondary | ICD-10-CM | POA: Insufficient documentation

## 2018-08-16 DIAGNOSIS — Z113 Encounter for screening for infections with a predominantly sexual mode of transmission: Secondary | ICD-10-CM

## 2018-08-16 DIAGNOSIS — O0930 Supervision of pregnancy with insufficient antenatal care, unspecified trimester: Secondary | ICD-10-CM | POA: Insufficient documentation

## 2018-08-16 DIAGNOSIS — O0933 Supervision of pregnancy with insufficient antenatal care, third trimester: Secondary | ICD-10-CM

## 2018-08-16 DIAGNOSIS — O09523 Supervision of elderly multigravida, third trimester: Secondary | ICD-10-CM

## 2018-08-16 DIAGNOSIS — Z3A36 36 weeks gestation of pregnancy: Secondary | ICD-10-CM

## 2018-08-16 DIAGNOSIS — O0993 Supervision of high risk pregnancy, unspecified, third trimester: Secondary | ICD-10-CM

## 2018-08-16 DIAGNOSIS — O10013 Pre-existing essential hypertension complicating pregnancy, third trimester: Secondary | ICD-10-CM

## 2018-08-16 DIAGNOSIS — O10913 Unspecified pre-existing hypertension complicating pregnancy, third trimester: Secondary | ICD-10-CM

## 2018-08-16 DIAGNOSIS — O98713 Human immunodeficiency virus [HIV] disease complicating pregnancy, third trimester: Secondary | ICD-10-CM

## 2018-08-16 DIAGNOSIS — O98719 Human immunodeficiency virus [HIV] disease complicating pregnancy, unspecified trimester: Secondary | ICD-10-CM

## 2018-08-16 DIAGNOSIS — O0990 Supervision of high risk pregnancy, unspecified, unspecified trimester: Secondary | ICD-10-CM

## 2018-08-16 DIAGNOSIS — O10919 Unspecified pre-existing hypertension complicating pregnancy, unspecified trimester: Secondary | ICD-10-CM

## 2018-08-16 DIAGNOSIS — O24415 Gestational diabetes mellitus in pregnancy, controlled by oral hypoglycemic drugs: Secondary | ICD-10-CM

## 2018-08-16 DIAGNOSIS — Z362 Encounter for other antenatal screening follow-up: Secondary | ICD-10-CM

## 2018-08-16 DIAGNOSIS — O99213 Obesity complicating pregnancy, third trimester: Secondary | ICD-10-CM

## 2018-08-16 NOTE — Progress Notes (Signed)
Subjective:  Courtney Chavez is a 43 y.o. G4P3003 at [redacted]w[redacted]d being seen today for ongoing prenatal care.  She is currently monitored for the following issues for this high-risk pregnancy and has Human immunodeficiency virus (HIV) disease ([place]); Hyperlipidemia; Essential hypertension; Obesity in pregnancy; Sebaceous cyst of ear; Insomnia; Vision changes; Supervision of high risk pregnancy, antepartum; Chronic hypertension in pregnancy; HIV infection in mother during pregnancy, antepartum; Advanced maternal age in multigravida; Late prenatal care, antepartum; Gestational diabetes mellitus (GDM); BMI 40.0-44.9, adult ([place]); and Unwanted fertility on their problem list.  Patient reports general discomforts of pregnancy.  Contractions: Irritability. Vag. Bleeding: None.  Movement: Present. Denies leaking of fluid.   The following portions of the patient's history were reviewed and updated as appropriate: allergies, current medications, past family history, past medical history, past social history, past surgical history and problem list. Problem list updated.  Objective:   Vitals:   08/16/18 1443 08/16/18 1446  BP: (!) 150/89 (!) 149/86  Pulse: 91 92  Temp: 99.2 F (37.3 C)   Weight: 280 lb 1.6 oz (127.1 kg)     Fetal Status: Fetal Heart Rate (bpm): 151   Movement: Present     General:  Alert, oriented and cooperative. Patient is in no acute distress.  Skin: Skin is warm and dry. No rash noted.   Cardiovascular: Normal heart rate noted  Respiratory: Normal respiratory effort, no problems with respiration noted  Abdomen: Soft, gravid, appropriate for gestational age. Pain/Pressure: Present     Pelvic:  Cervical exam performed        Extremities: Normal range of motion.  Edema: Trace  Mental Status: Normal mood and affect. Normal behavior. Normal judgment and thought content.   Urinalysis:      Assessment and Plan:  Pregnancy: G4P3003 at [redacted]w[redacted]d  1. Supervision of high risk pregnancy,  antepartum Stable GBS and vaginal cultures today  2. Chronic hypertension in pregnancy BP stable with current tx Will continue Antenatal testing weekly with MFM  3. HIV infection in mother during pregnancy, antepartum Has appt next with ID for VL  GDM CBG's in goal range Continue with current Tx Antenatal testing weekly with MFM  57. Multigravida of advanced maternal age in third trimester  5. Unwanted fertility BTL papers today  Preterm labor symptoms and general obstetric precautions including but not limited to vaginal bleeding, contractions, leaking of fluid and fetal movement were reviewed in detail with the patient. Please refer to After Visit Summary for other counseling recommendations.  Return in about 1 week (around 08/23/2018) for OB visit, virtual visit.   Chancy Milroy, MD

## 2018-08-17 ENCOUNTER — Other Ambulatory Visit (HOSPITAL_COMMUNITY): Payer: Self-pay | Admitting: *Deleted

## 2018-08-17 DIAGNOSIS — O24415 Gestational diabetes mellitus in pregnancy, controlled by oral hypoglycemic drugs: Secondary | ICD-10-CM

## 2018-08-17 LAB — GC/CHLAMYDIA PROBE AMP (~~LOC~~) NOT AT ARMC
Chlamydia: NEGATIVE
Neisseria Gonorrhea: NEGATIVE

## 2018-08-17 NOTE — Progress Notes (Signed)
Us/

## 2018-08-21 LAB — CULTURE, BETA STREP (GROUP B ONLY): Strep Gp B Culture: NEGATIVE

## 2018-08-23 ENCOUNTER — Other Ambulatory Visit: Payer: Self-pay

## 2018-08-23 ENCOUNTER — Ambulatory Visit (HOSPITAL_COMMUNITY)
Admission: RE | Admit: 2018-08-23 | Discharge: 2018-08-23 | Disposition: A | Discharge status: Home / Self Care | Payer: Medicaid Other | Source: Ambulatory Visit | Attending: Obstetrics and Gynecology | Admitting: Obstetrics and Gynecology

## 2018-08-23 ENCOUNTER — Other Ambulatory Visit: Payer: Self-pay | Admitting: Advanced Practice Midwife

## 2018-08-23 ENCOUNTER — Telehealth: Payer: Self-pay | Admitting: Infectious Diseases

## 2018-08-23 ENCOUNTER — Telehealth (INDEPENDENT_AMBULATORY_CARE_PROVIDER_SITE_OTHER): Payer: Self-pay | Admitting: Obstetrics & Gynecology

## 2018-08-23 ENCOUNTER — Ambulatory Visit (HOSPITAL_COMMUNITY): Payer: Medicaid Other

## 2018-08-23 VITALS — BP 136/90 | HR 88

## 2018-08-23 DIAGNOSIS — Z3A37 37 weeks gestation of pregnancy: Secondary | ICD-10-CM

## 2018-08-23 DIAGNOSIS — Z3009 Encounter for other general counseling and advice on contraception: Secondary | ICD-10-CM

## 2018-08-23 DIAGNOSIS — O10913 Unspecified pre-existing hypertension complicating pregnancy, third trimester: Secondary | ICD-10-CM

## 2018-08-23 DIAGNOSIS — O0930 Supervision of pregnancy with insufficient antenatal care, unspecified trimester: Secondary | ICD-10-CM

## 2018-08-23 DIAGNOSIS — O24415 Gestational diabetes mellitus in pregnancy, controlled by oral hypoglycemic drugs: Secondary | ICD-10-CM

## 2018-08-23 DIAGNOSIS — O09523 Supervision of elderly multigravida, third trimester: Secondary | ICD-10-CM

## 2018-08-23 DIAGNOSIS — O0933 Supervision of pregnancy with insufficient antenatal care, third trimester: Secondary | ICD-10-CM

## 2018-08-23 DIAGNOSIS — O10919 Unspecified pre-existing hypertension complicating pregnancy, unspecified trimester: Secondary | ICD-10-CM

## 2018-08-23 DIAGNOSIS — O0993 Supervision of high risk pregnancy, unspecified, third trimester: Secondary | ICD-10-CM

## 2018-08-23 DIAGNOSIS — O0990 Supervision of high risk pregnancy, unspecified, unspecified trimester: Secondary | ICD-10-CM

## 2018-08-23 DIAGNOSIS — O98713 Human immunodeficiency virus [HIV] disease complicating pregnancy, third trimester: Secondary | ICD-10-CM

## 2018-08-23 DIAGNOSIS — O98719 Human immunodeficiency virus [HIV] disease complicating pregnancy, unspecified trimester: Secondary | ICD-10-CM

## 2018-08-23 DIAGNOSIS — O368130 Decreased fetal movements, third trimester, not applicable or unspecified: Secondary | ICD-10-CM

## 2018-08-23 DIAGNOSIS — O10013 Pre-existing essential hypertension complicating pregnancy, third trimester: Secondary | ICD-10-CM

## 2018-08-23 DIAGNOSIS — Z6841 Body Mass Index (BMI) 40.0 and over, adult: Secondary | ICD-10-CM

## 2018-08-23 DIAGNOSIS — O99213 Obesity complicating pregnancy, third trimester: Secondary | ICD-10-CM

## 2018-08-23 NOTE — Progress Notes (Signed)
[place] VIRTUAL VIDEO VISIT ENCOUNTER NOTE  Provider location: [facility] [employer] at [facility]   I connected with Courtney Chavez on 08/23/18 at 11:15 AM EDT by WebEx Video Encounter at home and verified that I am speaking with the correct person using two identifiers.   I discussed the limitations, risks, security and privacy concerns of performing an evaluation and management service by telephone and the availability of in person appointments. I also discussed with the patient that there may be a patient responsible charge related to this service. The patient expressed understanding and agreed to proceed. Subjective:  Courtney Chavez is a 43 y.o. G4P3003 at [redacted]w[redacted]d being seen today for ongoing prenatal care.  She is currently monitored for the following issues for this high-risk pregnancy and has Human immunodeficiency virus (HIV) disease ([place]); Hyperlipidemia; Essential hypertension; Obesity in pregnancy; Sebaceous cyst of ear; Insomnia; Vision changes; Supervision of high risk pregnancy, antepartum; Chronic hypertension in pregnancy; HIV infection in mother during pregnancy, antepartum; Advanced maternal age in multigravida; Late prenatal care, antepartum; Gestational diabetes mellitus (GDM); BMI 40.0-44.9, adult ([place]); and Unwanted fertility on their problem list.  Patient reports contractions irregularly, decreased fetal movement..  Contractions: Irregular. Vag. Bleeding: None.  Movement: (!) Decreased. Denies any leaking of fluid.   The following portions of the patient's history were reviewed and updated as appropriate: allergies, current medications, past family history, past medical history, past social history, past surgical history and problem list.   Objective:   Vitals:   08/23/18 1111  BP: 136/90  Pulse: 88    Fetal Status:     Movement: (!) Decreased     General:  Alert, oriented and cooperative. Patient is in no acute distress.    Respiratory: Normal respiratory effort, no problems with respiration noted  Mental Status: Normal mood and affect. Normal behavior. Normal judgment and thought content.  Rest of physical exam deferred due to type of encounter  Imaging: [place] Mfm Fetal Bpp Wo Non Stress  Result Date: 08/17/2018 ----------------------------------------------------------------------  OBSTETRICS REPORT                        (Signed Final 08/17/2018 07:34 am) ---------------------------------------------------------------------- Patient Info  ID #:       [id number]                          D.O.B.:  1975/10/28 (43 yrs)  Name:       Courtney Chavez                Visit Date: 08/16/2018 04:16 pm ---------------------------------------------------------------------- Performed By  Performed By:     Ellin Saba          Ref. Address:      [address]                                                              [address]  Attending:        Sander Nephew      [facility]  Referred By:      [facility] ---------------------------------------------------------------------- Orders   #  Description                          Code         Ordered By   1  [place] MFM OB FOLLOW UP                  92426.83     Sander Nephew   2  [place] MFM FETAL BPP WO NON              76819.01     [place]      STRESS                                            BOOKER  ----------------------------------------------------------------------   #  Order #                    Accession #                 Episode #   1  [id number]                  [id number]   2  [id number]                  [id number]                  [id number]  ---------------------------------------------------------------------- Indications   [redacted] weeks gestation of pregnancy                Z3A.36    Gestational diabetes in pregnancy,             O24.415   controlled by oral hypoglycemic drugs   Hypertension - Chronic/Pre-                    O10.019   existing(Labetalol)   Advanced maternal age multigravida 46+,        O63.523   third trimester( Low Risk NIPS)(43 yrs)   HIV affecting pregnancy, third trimester       O98.713   Late to prenatal care, third trimester         V67.20   Obesity complicating pregnancy, third          O99.213   trimester (BMI 47)  ---------------------------------------------------------------------- Vital Signs  Weight (lb): 280                               Height:        5'4"  BMI:  48.06 ---------------------------------------------------------------------- Fetal Evaluation  Num Of Fetuses:          1  Fetal Heart Rate(bpm):   155  Cardiac Activity:        Observed  Presentation:            Cephalic  Placenta:                Anterior  P. Cord Insertion:       Visualized, central  Amniotic Fluid  AFI FV:      Within normal limits  AFI Sum(cm)     %Tile       Largest Pocket(cm)  12.17           40          3.78  RUQ(cm)       RLQ(cm)       LUQ(cm)        LLQ(cm)  3.78          3.23          2.92           2.24 ---------------------------------------------------------------------- Biophysical Evaluation  Amniotic F.V:   Within normal limits       F. Tone:         Observed  F. Movement:    Observed                   Score:           8/8  F. Breathing:   Observed ---------------------------------------------------------------------- Biometry  BPD:      88.1  mm     G. Age:  35w 4d         29  %    CI:        75.34   %    70 - 86                                                          FL/HC:       23.1  %    20.8 - 22.6  HC:      321.9  mm     G. Age:  36w 2d         14  %    HC/AC:       0.95       0.92 - 1.05  AC:      339.7  mm     G. Age:  37w 6d         87  %    FL/BPD:      84.4  %    71 - 87  FL:       74.4  mm     G. Age:  38w 0d         78  %    FL/AC:       21.9  %    20  - 24  HUM:      65.3  mm     G. Age:  37w 6d         89  %  LV:        4.3  mm  Est. FW:    3217   gm     7 lb 1 oz  72  % ---------------------------------------------------------------------- OB History  Gravidity:    4         Term:   3        Prem:   0        SAB:   0  TOP:          0       Ectopic:  0        Living: 3 ---------------------------------------------------------------------- Gestational Age  LMP:           36w 6d        Date:  12/01/17                 EDD:   09/07/18  U/S Today:     37w 0d                                        EDD:   09/06/18  Best:          36w 6d     Det. By:  LMP  (12/01/17)          EDD:   09/07/18 ---------------------------------------------------------------------- Anatomy  Cranium:               Appears normal         LVOT:                   Previously seen  Cavum:                 Appears normal         Aortic Arch:            Previously seen  Ventricles:            Appears normal         Ductal Arch:            Previously seen  Choroid Plexus:        Appears normal         Diaphragm:              Appears normal  Cerebellum:            Appears normal         Stomach:                Appears normal, left                                                                        sided  Posterior Fossa:       Appears normal         Abdomen:                Previously seen  Nuchal Fold:           Not applicable (>62    Abdominal Wall:         Previously seen                         wks GA)  Face:  Profile appears        Cord Vessels:           Previously seen                         normal  Lips:                  Previously seen        Kidneys:                Appear normal  Palate:                Previously seen        Bladder:                Appears normal  Thoracic:              Appears normal         Spine:                  Previously seen  Heart:                 Previously seen        Upper Extremities:      Previously seen  RVOT:                  Previously  seen        Lower Extremities:      Previously seen  Other:  Heels and 5th digit previously seen. ---------------------------------------------------------------------- Cervix Uterus Adnexa  Cervix  Not visualized (advanced GA >24wks)  Left Ovary  Not visualized.  Right Ovary  Not visualized.  Adnexa  No abnormality visualized. ---------------------------------------------------------------------- Impression  Normal interval growth.  Biophysical profile 8/8 ---------------------------------------------------------------------- Recommendations  Continue weekly BPP due to maternal indications  Consider delivery by 39 weeks. ----------------------------------------------------------------------               Sander Nephew, MD Electronically Signed Final Report   08/17/2018 07:34 am ----------------------------------------------------------------------  [place] Mfm Fetal Bpp Wo Non Stress  Result Date: 08/09/2018 ----------------------------------------------------------------------  OBSTETRICS REPORT                       (Signed Final 08/09/2018 03:37 pm) ---------------------------------------------------------------------- Patient Info  ID #:       [id number]                          D.O.B.:  Oct 13, 1975 (43 yrs)  Name:       Courtney Chavez                Visit Date: 08/09/2018 03:28 pm ---------------------------------------------------------------------- Performed By  Performed By:     Berlinda Last          Ref. Address:     [address]                                                             [address]  Attending:        Sander Nephew      [facility]                    MD                                       [facility]  Referred By:      [facility] ---------------------------------------------------------------------- Orders   #  Description                          Code         Ordered By   1  [place] MFM FETAL BPP WO NON              76819.01     [place]AN       STRESS                                            BOOKER  ----------------------------------------------------------------------   #  Order #                    Accession #                 Episode #   1  [id number]                  [id number]                  [id number]  ---------------------------------------------------------------------- Indications   Gestational diabetes in pregnancy,             O24.415   controlled by oral hypoglycemic drugs   [redacted] weeks gestation of pregnancy                Z3A.35   Hypertension - Chronic/Pre-                    O10.019   existing(Labetalol)   Advanced maternal age multigravida 53+,        O55.523   third trimester( Low Risk NIPS)(43 yrs)   HIV affecting pregnancy, third trimester       O98.713   Late to prenatal care, third trimester         M78.67   Obesity complicating pregnancy, third          O99.213   trimester (BMI 47)  ---------------------------------------------------------------------- Vital Signs                                                 Height:        5'4" ---------------------------------------------------------------------- Fetal Evaluation  Num Of Fetuses:         1  Fetal Heart Rate(bpm):  150  Cardiac Activity:       Observed  Presentation:           Cephalic  Placenta:               Anterior  Amniotic Fluid  AFI FV:      Within normal limits  AFI Sum(cm)     %Tile       Largest Pocket(cm)  10.5  25          3.72  RUQ(cm)       RLQ(cm)       LUQ(cm)        LLQ(cm)  2.57          1.86          2.35           3.72 ---------------------------------------------------------------------- Biophysical Evaluation  Amniotic F.V:   Within normal limits       F. Tone:        Observed  F. Movement:    Observed                   Score:          8/8  F. Breathing:   Observed ---------------------------------------------------------------------- OB History  Gravidity:    4         Term:   3        Prem:   0        SAB:   0  TOP:          0       Ectopic:  0         Living: 3 ---------------------------------------------------------------------- Gestational Age  LMP:           35w 6d        Date:  12/01/17                 EDD:   09/07/18  Best:          Barbie Haggis 6d     Det. By:  LMP  (12/01/17)          EDD:   09/07/18 ---------------------------------------------------------------------- Anatomy  Stomach:               Appears normal, left   Bladder:                Appears normal                         sided ---------------------------------------------------------------------- Impression  Biophysical profile 8/8  Chronic hypertension on meds  A2GDM  BMI >40 ---------------------------------------------------------------------- Recommendations  Continue weekly testing.  Repeat growth in 2 weeks. ----------------------------------------------------------------------               Sander Nephew, MD Electronically Signed Final Report   08/09/2018 03:37 pm ----------------------------------------------------------------------  [place] Mfm Fetal Bpp Wo Non Stress  Result Date: 08/02/2018 ----------------------------------------------------------------------  OBSTETRICS REPORT                       (Signed Final 08/02/2018 03:41 pm) ---------------------------------------------------------------------- Patient Info  ID #:       [id number]                          D.O.B.:  [birth year] (43 yrs)  Name:       Courtney Chavez                Visit Date: 08/02/2018 03:09 pm ---------------------------------------------------------------------- Performed By  Performed By:     Valda Favia          Ref. Address:     [address]  [address]  Attending:        Tama High MD        [facility]  Referred By:      [facility] ---------------------------------------------------------------------- Orders   #   Description                          Code         Ordered By   1  [place] MFM FETAL BPP WO NON              76819.01     [place]  ----------------------------------------------------------------------   #  Order #                    Accession #                 Episode #   1  [id number]                  [id number]                  [id number]  ---------------------------------------------------------------------- Indications   [redacted] weeks gestation of pregnancy                Z3A.34   Gestational diabetes in pregnancy,             O24.415   controlled by oral hypoglycemic drugs   Hypertension - Chronic/Pre-                    O10.019   existing(Labetalol)   Advanced maternal age multigravida 58+,        O60.523   third trimester( Low Risk NIPS)(43 yrs)   HIV affecting pregnancy, third trimester       O98.713   Late to prenatal care, third trimester         H73.42   Obesity complicating pregnancy, third          O99.213   trimester (BMI 47)  ---------------------------------------------------------------------- Vital Signs                                                 Height:        5'4" ---------------------------------------------------------------------- Fetal Evaluation  Num Of Fetuses:         1  Fetal Heart Rate(bpm):  150  Cardiac Activity:       Observed  Presentation:           Cephalic  Amniotic Fluid  AFI FV:      Within normal limits  AFI Sum(cm)     %Tile       Largest Pocket(cm)  13.24           44          4.14  RUQ(cm)       RLQ(cm)  LUQ(cm)        LLQ(cm)  4.14          3.5           2.63           2.97 ---------------------------------------------------------------------- Biophysical Evaluation  Amniotic F.V:   Within normal limits       F. Tone:        Observed  F. Movement:    Observed                   Score:          8/8  F. Breathing:   Observed ---------------------------------------------------------------------- OB History  Gravidity:    4         Term:   3        Prem:   0         SAB:   0  TOP:          0       Ectopic:  0        Living: 3 ---------------------------------------------------------------------- Gestational Age  LMP:           34w 6d        Date:  12/01/17                 EDD:   09/07/18  Best:          34w 6d     Det. By:  LMP  (12/01/17)          EDD:   09/07/18 ---------------------------------------------------------------------- Cervix Uterus Adnexa  Cervix  Not visualized (advanced GA >24wks) ---------------------------------------------------------------------- Impression  Chronic hypertension. Patient takes labetalol and has missed  her doses. She will be picking up refills today. Her blood  pressure at our office today is 143/97 mm Hg.  Amniotic fluid is normal and good fetal activity is seen.  Antenatal testing is reassuring. BPP 8/8. ---------------------------------------------------------------------- Recommendations  -Continue weekly BPP till delivery. ----------------------------------------------------------------------                  Tama High, MD Electronically Signed Final Report   08/02/2018 03:41 pm ----------------------------------------------------------------------  [place] Mfm Fetal Bpp Wo Non Stress  Result Date: 07/27/2018 ----------------------------------------------------------------------  OBSTETRICS REPORT                       (Signed Final 07/27/2018 10:24 am) ---------------------------------------------------------------------- Patient Info  ID #:       [id number]                          D.O.B.:  17-Nov-1975 (43 yrs)  Name:       Courtney Chavez                Visit Date: 07/26/2018 03:06 pm ---------------------------------------------------------------------- Performed By  Performed By:     Rodrigo Ran BS      Ref. Address:     [address]                                                             [address]  Attending:  Corenthian Booker      [facility]                     MD                                       Fetal Care  Referred By:      [facility] ---------------------------------------------------------------------- Orders   #  Description                          Code         Ordered By   1  [place] MFM FETAL BPP WO NON              76819.01     [facility]      STRESS  ----------------------------------------------------------------------   #  Order #                    Accession #                 Episode #   1  [id number]                  [id number]                  [id number]  ---------------------------------------------------------------------- Indications   Encounter for other antenatal screening        Z36.2   follow-up   Gestational diabetes in pregnancy,             O24.415   controlled by oral hypoglycemic drugs   Hypertension - Chronic/Pre-                    O10.019   existing(Labetalol)   Advanced maternal age multigravida 4+,        O28.523   third trimester( Low Risk NIPS)(43 yrs)   HIV affecting pregnancy, third trimester       O98.713   Late to prenatal care, third trimester         G66.59   Obesity complicating pregnancy, third          O99.213   trimester (BMI 47)   [redacted] weeks gestation of pregnancy                Z3A.33  ---------------------------------------------------------------------- Vital Signs                                                 Height:        5'4" ---------------------------------------------------------------------- Fetal Evaluation  Num Of Fetuses:         1  Fetal Heart Rate(bpm):  164  Cardiac Activity:       Observed  Presentation:           Cephalic  Amniotic Fluid  AFI FV:      Subjectively low-normal  AFI Sum(cm)     %Tile       Largest Pocket(cm)  8.06            5           3.17  RUQ(cm)  LUQ(cm)        LLQ(cm)  3.17                        2.61           2.28 ---------------------------------------------------------------------- Biophysical Evaluation  Amniotic F.V:   Within normal limits       F. Tone:         Observed  F. Movement:    Observed                   Score:          8/8  F. Breathing:   Observed ---------------------------------------------------------------------- OB History  Gravidity:    4         Term:   3        Prem:   0        SAB:   0  TOP:          0       Ectopic:  0        Living: 3 ---------------------------------------------------------------------- Gestational Age  LMP:           33w 6d        Date:  12/01/17                 EDD:   09/07/18  Best:          33w 6d     Det. By:  LMP  (12/01/17)          EDD:   09/07/18 ---------------------------------------------------------------------- Impression  Biophysical profile  Chronic Hypertension on medication  HIV  BMI >40 ---------------------------------------------------------------------- Recommendations  Continue weekly BPP  [place] scheduled in 3 weeks. ----------------------------------------------------------------------               Sander Nephew, MD Electronically Signed Final Report   07/27/2018 10:24 am ----------------------------------------------------------------------  [place] Mfm Ob Follow Up  Result Date: 08/17/2018 ----------------------------------------------------------------------  OBSTETRICS REPORT                        (Signed Final 08/17/2018 07:34 am) ---------------------------------------------------------------------- Patient Info  ID #:       [id number]                          D.O.B.:  1975/03/13 (43 yrs)  Name:       Courtney Chavez                Visit Date: 08/16/2018 04:16 pm ---------------------------------------------------------------------- Performed By  Performed By:     Ellin Saba          Ref. Address:      [address]                                                              [address]  Attending:        Sander Nephew      [facility]  Referred By:      [facility]  ---------------------------------------------------------------------- Orders   #  Description                          Code         Ordered By   1  [place] MFM OB FOLLOW UP                  65993.57     Sander Nephew   2  [place] MFM FETAL BPP WO NON              76819.01     [place]      STRESS                                            [place]ER  ----------------------------------------------------------------------   #  Order #                    Accession #                 Episode #   1  [id number]                  [id number]   2  [id number]                  [id number]                  [id number]  ---------------------------------------------------------------------- Indications   [redacted] weeks gestation of pregnancy                Z3A.36   Gestational diabetes in pregnancy,             O24.415   controlled by oral hypoglycemic drugs   Hypertension - Chronic/Pre-                    O10.019   existing(Labetalol)   Advanced maternal age multigravida 55+,        O58.523   third trimester( Low Risk NIPS)(43 yrs)   HIV affecting pregnancy, third trimester       O98.713   Late to prenatal care, third trimester         R41.63   Obesity complicating pregnancy, third          O99.213   trimester (BMI 47)  ---------------------------------------------------------------------- Vital Signs  Weight (lb): 280                               Height:        5'4"  BMI:  48.06 ---------------------------------------------------------------------- Fetal Evaluation  Num Of Fetuses:          1  Fetal Heart Rate(bpm):   155  Cardiac Activity:        Observed  Presentation:            Cephalic  Placenta:                Anterior  P. Cord Insertion:       Visualized, central  Amniotic Fluid  AFI FV:      Within normal limits  AFI Sum(cm)     %Tile       Largest Pocket(cm)  12.17           40          3.78  RUQ(cm)       RLQ(cm)       LUQ(cm)        LLQ(cm)  3.78           3.23          2.92           2.24 ---------------------------------------------------------------------- Biophysical Evaluation  Amniotic F.V:   Within normal limits       F. Tone:         Observed  F. Movement:    Observed                   Score:           8/8  F. Breathing:   Observed ---------------------------------------------------------------------- Biometry  BPD:      88.1  mm     G. Age:  35w 4d         29  %    CI:        75.34   %    70 - 86                                                          FL/HC:       23.1  %    20.8 - 22.6  HC:      321.9  mm     G. Age:  36w 2d         14  %    HC/AC:       0.95       0.92 - 1.05  AC:      339.7  mm     G. Age:  37w 6d         87  %    FL/BPD:      84.4  %    71 - 87  FL:       74.4  mm     G. Age:  38w 0d         78  %    FL/AC:       21.9  %    20 - 24  HUM:      65.3  mm     G. Age:  37w 6d         89  %  LV:        4.3  mm  Est. FW:    3217   gm     7 lb 1 oz  72  % ---------------------------------------------------------------------- OB History  Gravidity:    4         Term:   3        Prem:   0        SAB:   0  TOP:          0       Ectopic:  0        Living: 3 ---------------------------------------------------------------------- Gestational Age  LMP:           36w 6d        Date:  12/01/17                 EDD:   09/07/18  U/S Today:     37w 0d                                        EDD:   09/06/18  Best:          36w 6d     Det. By:  LMP  (12/01/17)          EDD:   09/07/18 ---------------------------------------------------------------------- Anatomy  Cranium:               Appears normal         LVOT:                   Previously seen  Cavum:                 Appears normal         Aortic Arch:            Previously seen  Ventricles:            Appears normal         Ductal Arch:            Previously seen  Choroid Plexus:        Appears normal         Diaphragm:              Appears normal  Cerebellum:            Appears normal          Stomach:                Appears normal, left                                                                        sided  Posterior Fossa:       Appears normal         Abdomen:                Previously seen  Nuchal Fold:           Not applicable (>64    Abdominal Wall:         Previously seen                         wks GA)  Face:  Profile appears        Cord Vessels:           Previously seen                         normal  Lips:                  Previously seen        Kidneys:                Appear normal  Palate:                Previously seen        Bladder:                Appears normal  Thoracic:              Appears normal         Spine:                  Previously seen  Heart:                 Previously seen        Upper Extremities:      Previously seen  RVOT:                  Previously seen        Lower Extremities:      Previously seen  Other:  Heels and 5th digit previously seen. ---------------------------------------------------------------------- Cervix Uterus Adnexa  Cervix  Not visualized (advanced GA >24wks)  Left Ovary  Not visualized.  Right Ovary  Not visualized.  Adnexa  No abnormality visualized. ---------------------------------------------------------------------- Impression  Normal interval growth.  Biophysical profile 8/8 ---------------------------------------------------------------------- Recommendations  Continue weekly BPP due to maternal indications  Consider delivery by 39 weeks. ----------------------------------------------------------------------               Sander Nephew, MD Electronically Signed Final Report   08/17/2018 07:34 am ----------------------------------------------------------------------   Assessment and Plan:  Pregnancy: [id number] at [redacted]w[redacted]d 1. Unwanted fertility  2. BMI 40.0-44.9, adult ([place])  3. Multigravida of advanced maternal age in third trimester  4. 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  Chronic hypertension in pregnancy -on lebatalol - IOL at 39 weeks, orders signed  6. Gestational diabetes mellitus (GDM) in third trimester controlled on oral hypoglycemic drug - the values that she reports are all within range  7. Supervision of high risk pregnancy, antepartum - decreased fetal movement, she has an appt with MFM today for a BPP  8. Late prenatal care, antepartum   Term labor symptoms and general obstetric precautions including but not limited to vaginal bleeding, contractions, leaking of fluid and fetal movement were reviewed in detail with the patient. I discussed the assessment and treatment plan with the patient. The patient was provided an opportunity to ask questions and all were answered. The patient agreed with the plan and demonstrated an understanding of the instructions. The patient was advised to call back or seek an in-person office evaluation/go to [facility]U at [facility] for any urgent or concerning symptoms. Please refer to After Visit Summary for other counseling recommendations.   I provided 10 minutes of face-to-face time during this encounter.  No follow-ups on file.  Future Appointments  Date Time Provider [place]  08/23/2018  3:30 PM [place] MFC-US  08/23/2018  3:30 PM  [place] 1 WH-MFCUS MFC-US  08/24/2018  3:30 PM Campbell Riches, MD [facility]  08/30/2018  3:30 PM [place] MFC-US  08/30/2018  3:30 PM [place] 1 WH-MFCUS MFC-US  09/02/2018  2:15 PM [place] Mode, MD [place]  09/06/2018  3:15 PM [place] NURSE [place] MFC-US  09/06/2018  3:15 PM [place] 4 WH-MFCUS MFC-US  09/08/2018 10:15 AM Aletha Halim, MD [place], MD Center for [place], [place]

## 2018-08-23 NOTE — Progress Notes (Signed)
I connected with  Courtney Chavez on 08/23/18 at 11:15 AM EDT by telephone and verified that I am speaking with the correct person using two identifiers.   I discussed the limitations, risks, security and privacy concerns of performing an evaluation and management service by telephone and the availability of in person appointments. I also discussed with the patient that there may be a patient responsible charge related to this service. The patient expressed understanding and agreed to proceed.  Dolores Hoose, RN 08/23/2018  11:08 AM  Pt states she is having lower abdominal pressure on the right side and is having contractions every hour.  Pt states baby is not moving as much as before which she believes is d/t baby running out of room.  Pt denies headaches or visual changes.

## 2018-08-23 NOTE — Progress Notes (Signed)
IOL scheduled for July 14th @ 0730.

## 2018-08-23 NOTE — Telephone Encounter (Signed)
COVID-19 Pre-Screening Questions:08/23/18 ° ° °Do you currently have a fever (>100 °F), chills or unexplained body aches? NO  ° °Are you currently experiencing new cough, shortness of breath, sore throat, runny nose?NO °•  °Have you recently travelled outside the state of [place] in the last 14 days? NO °•  °Have you been in contact with someone that is currently pending confirmation of Covid19 testing or has been confirmed to have the Covid19 virus?  NO ° °**If the patient answers NO to ALL questions -  advise the patient to please call the clinic before coming to the office should any symptoms develop.  ° ° °

## 2018-08-24 ENCOUNTER — Encounter: Payer: Self-pay | Admitting: *Deleted

## 2018-08-24 ENCOUNTER — Other Ambulatory Visit: Payer: Self-pay

## 2018-08-24 ENCOUNTER — Ambulatory Visit (INDEPENDENT_AMBULATORY_CARE_PROVIDER_SITE_OTHER): Payer: Self-pay | Admitting: Infectious Diseases

## 2018-08-24 ENCOUNTER — Encounter: Payer: Self-pay | Admitting: Infectious Diseases

## 2018-08-24 ENCOUNTER — Telehealth (HOSPITAL_COMMUNITY): Payer: Self-pay | Admitting: *Deleted

## 2018-08-24 DIAGNOSIS — O24415 Gestational diabetes mellitus in pregnancy, controlled by oral hypoglycemic drugs: Secondary | ICD-10-CM

## 2018-08-24 DIAGNOSIS — Z6841 Body Mass Index (BMI) 40.0 and over, adult: Secondary | ICD-10-CM

## 2018-08-24 DIAGNOSIS — Z3009 Encounter for other general counseling and advice on contraception: Secondary | ICD-10-CM

## 2018-08-24 DIAGNOSIS — I10 Essential (primary) hypertension: Secondary | ICD-10-CM

## 2018-08-24 DIAGNOSIS — B20 Human immunodeficiency virus [HIV] disease: Secondary | ICD-10-CM

## 2018-08-24 MED ORDER — ORLISTAT 120 MG PO CAPS: 120.0000 mg | ORAL_CAPSULE | Freq: Three times a day (TID) | ORAL | Status: DC

## 2018-08-24 NOTE — Assessment & Plan Note (Signed)
Appreciate OB rx and f/u.  She is currently asx.

## 2018-08-24 NOTE — Progress Notes (Signed)
   Subjective:    Patient ID: Courtney Chavez, female    DOB: Jul 01, 1975, 43 y.o.   MRN: [id number]  HPI 43yo F from [place] came to [place] 2001. HIV+ and HTN.Also hx ofEFV/NVP resistance. Was changed to complerathen to triumeq due to wt gain.  Has 3 sons(09-28-12).They are doing well. She is currently on [place]- truvada. No problems with this. [id number]. She is currently 37 weeks IUP. + fetal movements.  She is on PNV, labetalol for HTN. Also on metformin after failing OGTT.   "I am ready to pop this baby. I am tired". Lots of fetal movement.  No problems with ART. Has had variable BP. No sx from BP.  No LE swelling, no headaches. No SOB. No cough.  Not exercising.   HIV 1 RNA Quant  Date Value  06/11/2018 <20 NOT DETECTED copies/mL  03/11/2018 <20 NOT DETECTED copies/mL  02/24/2018 CANCELED   CD4 T Cell Abs (/uL)  Date Value  06/11/2018 530  03/11/2018 720  01/20/2018 770    Review of Systems  Constitutional: Negative for chills, fever and unexpected weight change.  Eyes: Negative for visual disturbance.  Respiratory: Negative for cough and shortness of breath.   Cardiovascular: Negative for leg swelling.  Gastrointestinal: Positive for constipation. Negative for diarrhea.  Genitourinary: Negative for difficulty urinating.  Neurological: Negative for headaches.   Please see HPI. All other systems reviewed and negative.     Objective:   Physical Exam Constitutional:      Appearance: Normal appearance. She is obese.  HENT:     Mouth/Throat:     Mouth: Mucous membranes are moist.     Pharynx: No oropharyngeal exudate.  Eyes:     Extraocular Movements: Extraocular movements intact.     Pupils: Pupils are equal, round, and reactive to light.  Neck:     Musculoskeletal: Normal range of motion and neck supple.  Cardiovascular:     Rate and Rhythm: Normal rate and regular rhythm.  Pulmonary:     Effort: Pulmonary effort is normal.     Breath sounds: Normal  breath sounds.  Abdominal:     General: Bowel sounds are normal. There is distension.     Palpations: Abdomen is soft.  Musculoskeletal:     Right lower leg: No edema.     Left lower leg: No edema.  Neurological:     General: No focal deficit present.     Mental Status: She is alert.  Psychiatric:        Mood and Affect: Mood normal.       Assessment & Plan:

## 2018-08-24 NOTE — Assessment & Plan Note (Signed)
Will continue o her 2 drug rx till she delivers then change her back to 3 drug rx. Fixed dose combination.  Will see her back in 4-6 weeks.  Assured that she will not breast feed.  Offered/refused condoms.  Husband is HIV-

## 2018-08-24 NOTE — Telephone Encounter (Signed)
Preadmission screen  

## 2018-08-24 NOTE — Assessment & Plan Note (Signed)
Continues on metformin. Takes at [facility].  No dizziness or lightheadedness.

## 2018-08-24 NOTE — Assessment & Plan Note (Addendum)
Wants to start back on xenical post-partum She is clear not to start this til post-partum.  She will discuss with pharmacy.

## 2018-08-24 NOTE — Assessment & Plan Note (Signed)
Wants to get tubal ligation after delivery.

## 2018-08-25 ENCOUNTER — Encounter (HOSPITAL_COMMUNITY): Payer: Self-pay | Admitting: *Deleted

## 2018-08-25 ENCOUNTER — Telehealth (HOSPITAL_COMMUNITY): Payer: Self-pay | Admitting: *Deleted

## 2018-08-25 NOTE — Telephone Encounter (Signed)
Preadmission screen  

## 2018-08-27 ENCOUNTER — Other Ambulatory Visit: Payer: Self-pay

## 2018-08-27 ENCOUNTER — Other Ambulatory Visit (HOSPITAL_COMMUNITY)
Admission: RE | Admit: 2018-08-27 | Discharge: 2018-08-27 | Disposition: A | Discharge status: Home / Self Care | Payer: Self-pay | Source: Ambulatory Visit | Attending: Obstetrics and Gynecology | Admitting: Obstetrics and Gynecology

## 2018-08-27 DIAGNOSIS — Z1159 Encounter for screening for other viral diseases: Secondary | ICD-10-CM | POA: Insufficient documentation

## 2018-08-27 DIAGNOSIS — Z01812 Encounter for preprocedural laboratory examination: Secondary | ICD-10-CM | POA: Insufficient documentation

## 2018-08-27 LAB — SARS CORONAVIRUS 2 (TAT 6-24 HRS): SARS Coronavirus 2: NEGATIVE

## 2018-08-27 NOTE — MAU Note (Signed)
Asymptomatic, swab collected.

## 2018-08-30 ENCOUNTER — Ambulatory Visit (INDEPENDENT_AMBULATORY_CARE_PROVIDER_SITE_OTHER): Payer: Self-pay | Admitting: Obstetrics & Gynecology

## 2018-08-30 ENCOUNTER — Other Ambulatory Visit: Payer: Self-pay

## 2018-08-30 ENCOUNTER — Ambulatory Visit (HOSPITAL_COMMUNITY): Payer: Medicaid Other | Admitting: *Deleted

## 2018-08-30 ENCOUNTER — Other Ambulatory Visit: Payer: Self-pay | Admitting: Obstetrics and Gynecology

## 2018-08-30 ENCOUNTER — Ambulatory Visit (HOSPITAL_COMMUNITY)
Admission: RE | Admit: 2018-08-30 | Discharge: 2018-08-30 | Disposition: A | Discharge status: Home / Self Care | Payer: Medicaid Other | Source: Ambulatory Visit | Attending: Obstetrics and Gynecology | Admitting: Obstetrics and Gynecology

## 2018-08-30 ENCOUNTER — Encounter (HOSPITAL_COMMUNITY): Payer: Self-pay | Admitting: *Deleted

## 2018-08-30 ENCOUNTER — Inpatient Hospital Stay (HOSPITAL_COMMUNITY)
Admission: AD | Admit: 2018-08-30 | Discharge: 2018-09-02 | DRG: 806 | Disposition: A | Discharge status: Home / Self Care | Payer: Medicaid Other | Attending: Obstetrics and Gynecology | Admitting: Obstetrics and Gynecology

## 2018-08-30 VITALS — BP 126/88 | HR 90 | Temp 98.6°F | Wt 280.6 lb

## 2018-08-30 DIAGNOSIS — O0990 Supervision of high risk pregnancy, unspecified, unspecified trimester: Secondary | ICD-10-CM | POA: Insufficient documentation

## 2018-08-30 DIAGNOSIS — O24415 Gestational diabetes mellitus in pregnancy, controlled by oral hypoglycemic drugs: Secondary | ICD-10-CM | POA: Insufficient documentation

## 2018-08-30 DIAGNOSIS — O10919 Unspecified pre-existing hypertension complicating pregnancy, unspecified trimester: Secondary | ICD-10-CM | POA: Insufficient documentation

## 2018-08-30 DIAGNOSIS — Z3A38 38 weeks gestation of pregnancy: Secondary | ICD-10-CM | POA: Diagnosis not present

## 2018-08-30 DIAGNOSIS — O1002 Pre-existing essential hypertension complicating childbirth: Secondary | ICD-10-CM | POA: Diagnosis present

## 2018-08-30 DIAGNOSIS — Z21 Asymptomatic human immunodeficiency virus [HIV] infection status: Secondary | ICD-10-CM | POA: Diagnosis present

## 2018-08-30 DIAGNOSIS — O09529 Supervision of elderly multigravida, unspecified trimester: Secondary | ICD-10-CM

## 2018-08-30 DIAGNOSIS — Z1159 Encounter for screening for other viral diseases: Secondary | ICD-10-CM

## 2018-08-30 DIAGNOSIS — O98719 Human immunodeficiency virus [HIV] disease complicating pregnancy, unspecified trimester: Secondary | ICD-10-CM

## 2018-08-30 DIAGNOSIS — O4103X0 Oligohydramnios, third trimester, not applicable or unspecified: Secondary | ICD-10-CM | POA: Diagnosis present

## 2018-08-30 DIAGNOSIS — O0930 Supervision of pregnancy with insufficient antenatal care, unspecified trimester: Secondary | ICD-10-CM

## 2018-08-30 DIAGNOSIS — O99213 Obesity complicating pregnancy, third trimester: Secondary | ICD-10-CM

## 2018-08-30 DIAGNOSIS — O10013 Pre-existing essential hypertension complicating pregnancy, third trimester: Secondary | ICD-10-CM

## 2018-08-30 DIAGNOSIS — O9872 Human immunodeficiency virus [HIV] disease complicating childbirth: Secondary | ICD-10-CM | POA: Diagnosis present

## 2018-08-30 DIAGNOSIS — Z3A39 39 weeks gestation of pregnancy: Secondary | ICD-10-CM | POA: Diagnosis not present

## 2018-08-30 DIAGNOSIS — Z3009 Encounter for other general counseling and advice on contraception: Secondary | ICD-10-CM

## 2018-08-30 DIAGNOSIS — O0933 Supervision of pregnancy with insufficient antenatal care, third trimester: Secondary | ICD-10-CM

## 2018-08-30 DIAGNOSIS — O98713 Human immunodeficiency virus [HIV] disease complicating pregnancy, third trimester: Secondary | ICD-10-CM

## 2018-08-30 DIAGNOSIS — O10913 Unspecified pre-existing hypertension complicating pregnancy, third trimester: Secondary | ICD-10-CM

## 2018-08-30 DIAGNOSIS — O24425 Gestational diabetes mellitus in childbirth, controlled by oral hypoglycemic drugs: Secondary | ICD-10-CM | POA: Diagnosis present

## 2018-08-30 DIAGNOSIS — O24419 Gestational diabetes mellitus in pregnancy, unspecified control: Secondary | ICD-10-CM | POA: Diagnosis present

## 2018-08-30 DIAGNOSIS — O0993 Supervision of high risk pregnancy, unspecified, third trimester: Secondary | ICD-10-CM

## 2018-08-30 DIAGNOSIS — O09523 Supervision of elderly multigravida, third trimester: Secondary | ICD-10-CM

## 2018-08-30 LAB — COMPREHENSIVE METABOLIC PANEL
ALT: 18 U/L (ref 0–44)
AST: 22 U/L (ref 15–41)
Albumin: 2.8 g/dL — ABNORMAL LOW (ref 3.5–5.0)
Alkaline Phosphatase: 61 U/L (ref 38–126)
Anion gap: 9 (ref 5–15)
BUN: 5 mg/dL — ABNORMAL LOW (ref 6–20)
CO2: 19 mmol/L — ABNORMAL LOW (ref 22–32)
Calcium: 8.8 mg/dL — ABNORMAL LOW (ref 8.9–10.3)
Chloride: 105 mmol/L (ref 98–111)
Creatinine, Ser: 0.56 mg/dL (ref 0.44–1.00)
GFR calc Af Amer: >60 mL/min (ref >60)
GFR calc non Af Amer: >60 mL/min (ref >60)
Glucose, Bld: 117 mg/dL — ABNORMAL HIGH (ref 70–99)
Potassium: 3.6 mmol/L (ref 3.5–5.1)
Sodium: 133 mmol/L — ABNORMAL LOW (ref 135–145)
Total Bilirubin: 0.4 mg/dL (ref 0.3–1.2)
Total Protein: 6.4 g/dL — ABNORMAL LOW (ref 6.5–8.1)

## 2018-08-30 LAB — CBC
HCT: 33 % — ABNORMAL LOW (ref 36.0–46.0)
Hemoglobin: 10.7 g/dL — ABNORMAL LOW (ref 12.0–15.0)
MCH: 30.3 pg (ref 26.0–34.0)
MCHC: 32.4 g/dL (ref 30.0–36.0)
MCV: 93.5 fL (ref 80.0–100.0)
Platelets: 200 10*3/uL (ref 150–400)
RBC: 3.53 MIL/uL — ABNORMAL LOW (ref 3.87–5.11)
RDW: 13.8 % (ref 11.5–15.5)
WBC: 6.9 10*3/uL (ref 4.0–10.5)
nRBC: 0 % (ref 0.0–0.2)

## 2018-08-30 LAB — TYPE AND SCREEN
ABO/RH(D): O POS
Antibody Screen: NEGATIVE

## 2018-08-30 LAB — PROTEIN / CREATININE RATIO, URINE
Creatinine, Urine: 131.96 mg/dL
Protein Creatinine Ratio: 0.17 mg/mg{Cre} — ABNORMAL HIGH (ref 0.00–0.15)
Total Protein, Urine: 22 mg/dL

## 2018-08-30 LAB — ABO/RH: ABO/RH(D): O POS

## 2018-08-30 LAB — GLUCOSE, CAPILLARY
Glucose-Capillary: 123 mg/dL — ABNORMAL HIGH (ref 70–99)
Glucose-Capillary: 135 mg/dL — ABNORMAL HIGH (ref 70–99)

## 2018-08-30 MED ORDER — METFORMIN HCL 500 MG PO TABS
500.0000 mg | ORAL_TABLET | Freq: Every day | ORAL | Status: DC
  Administered 2018-08-30: 500 mg via ORAL
  Filled 2018-08-30: qty 1

## 2018-08-30 MED ORDER — OXYTOCIN BOLUS FROM INFUSION
500.0000 mL | Freq: Once | INTRAVENOUS | Status: AC
  Administered 2018-08-31: 500 mL via INTRAVENOUS

## 2018-08-30 MED ORDER — LIDOCAINE HCL (PF) 1 % IJ SOLN
30.0000 mL | INTRAMUSCULAR | Status: AC | PRN
  Administered 2018-08-31: 30 mL via SUBCUTANEOUS
  Filled 2018-08-30: qty 30

## 2018-08-30 MED ORDER — ACETAMINOPHEN 325 MG PO TABS: 650.0000 mg | ORAL_TABLET | ORAL | Status: DC | PRN

## 2018-08-30 MED ORDER — MISOPROSTOL 50MCG HALF TABLET
50.0000 ug | ORAL_TABLET | ORAL | Status: DC
  Administered 2018-08-30 – 2018-08-31 (×2): 50 ug via BUCCAL
  Filled 2018-08-30 (×2): qty 1

## 2018-08-30 MED ORDER — LABETALOL HCL 200 MG PO TABS
200.0000 mg | ORAL_TABLET | Freq: Two times a day (BID) | ORAL | Status: DC
  Administered 2018-08-30 – 2018-09-02 (×6): 200 mg via ORAL
  Filled 2018-08-30 (×6): qty 1

## 2018-08-30 MED ORDER — TERBUTALINE SULFATE 1 MG/ML IJ SOLN: 0.2500 mg | Freq: Once | INTRAMUSCULAR | Status: DC | PRN

## 2018-08-30 MED ORDER — OXYTOCIN 40 UNITS IN NORMAL SALINE INFUSION - SIMPLE MED
2.5000 [IU]/h | INTRAVENOUS | Status: DC
  Administered 2018-08-31: 2.5 [IU]/h via INTRAVENOUS
  Filled 2018-08-30: qty 1000

## 2018-08-30 MED ORDER — ZIDOVUDINE 10 MG/ML IV SOLN
1.0000 mg/kg/h | INTRAVENOUS | Status: DC
  Administered 2018-08-30 – 2018-08-31 (×3): 1 mg/kg/h via INTRAVENOUS
  Filled 2018-08-30 (×4): qty 40

## 2018-08-30 MED ORDER — LACTATED RINGERS IV SOLN
INTRAVENOUS | Status: DC
  Administered 2018-08-30 – 2018-08-31 (×2): via INTRAVENOUS

## 2018-08-30 MED ORDER — SOD CITRATE-CITRIC ACID 500-334 MG/5ML PO SOLN: 30.0000 mL | ORAL | Status: DC | PRN

## 2018-08-30 MED ORDER — ZIDOVUDINE 10 MG/ML IV SOLN
2.0000 mg/kg | Freq: Once | INTRAVENOUS | Status: AC
  Administered 2018-08-30: 255 mg via INTRAVENOUS
  Filled 2018-08-30: qty 25.5

## 2018-08-30 MED ORDER — LACTATED RINGERS IV SOLN
500.0000 mL | INTRAVENOUS | Status: DC | PRN
  Administered 2018-08-31: 500 mL via INTRAVENOUS

## 2018-08-30 MED ORDER — ONDANSETRON HCL 4 MG/2ML IJ SOLN: 4.0000 mg | Freq: Four times a day (QID) | INTRAMUSCULAR | Status: DC | PRN

## 2018-08-30 MED ORDER — OXYCODONE-ACETAMINOPHEN 5-325 MG PO TABS: 1.0000 | ORAL_TABLET | ORAL | Status: DC | PRN

## 2018-08-30 MED ORDER — OXYCODONE-ACETAMINOPHEN 5-325 MG PO TABS: 2.0000 | ORAL_TABLET | ORAL | Status: DC | PRN

## 2018-08-30 NOTE — H&P (Addendum)
LABOR AND DELIVERY ADMISSION HISTORY AND PHYSICAL NOTE  Courtney Chavez is a 43 y.o. female [phone] with IUP at [redacted]w[redacted]d by LMP/20 wk [place] presenting for IOL for oligohydramnios and BPP 6/8 (fetal movement not observed) during evaluation with MFM today. However, she does reports positive fetal movement. She denies leakage of fluid or vaginal bleeding.  Prenatal History: PNC at [facility]  Pregnancy complications:   Maternal HIV:   Diagnosed approximately 14 years ago, viral load has been undetectable for several years  Most recent HIV RNA quant undetectable on 06/11/2018   Takes Truvada 200-300mg  daily   Follows with Infectious disease, Dr. Johnnye Sima, recently seen on 08/24/2018    AMA (43 yo)   A2GDM:  Stable, takes metformin 500mg  nightly   Present in previous pregnancies  Chronic hypertension:   Takes Labetalol 200mg  BID   History of hypertension for the past 10+ years, was on HCTZ 25mg  with appropriate control prior to pregnancy   Past Medical History: Past Medical History:  Diagnosis Date  . Gestational diabetes    metformin  . HIV (human immunodeficiency virus infection) ([place])   . HIV infection ([place])   . Hypertension     Past Surgical History: Past Surgical History:  Procedure Laterality Date  . NO PAST SURGERIES      Obstetrical History: OB History    Gravida  4   Para  3   Term  3   Preterm      AB      Living  3     SAB      TAB      Ectopic      Multiple      Live Births  3           Social History: Social History   Socioeconomic History  . Marital status: Single    Spouse name: Not on file  . Number of children: Not on file  . Years of education: Not on file  . Highest education level: Not on file  Occupational History    Comment: unemployed  Social Needs  . Financial resource strain: Not on file  . Food insecurity    Worry: Sometimes true    Inability: Sometimes true  . Transportation needs    Medical: No     Non-medical: No  Tobacco Use  . Smoking status: Never Smoker  . Smokeless tobacco: Never Used  Substance and Sexual Activity  . Alcohol use: Not Currently    Comment: rarely  . Drug use: No  . Sexual activity: Not Currently    Partners: Male    Comment: pt. given condoms  Lifestyle  . Physical activity    Days per week: 3 days    Minutes per session: 30 min  . Stress: Only a little  Relationships  . Social connections    Talks on phone: More than three times a week    Gets together: Twice a week    Attends religious service: More than 4 times per year    Active member of club or organization: Yes    Attends meetings of clubs or organizations: More than 4 times per year    Relationship status: Never married  Other Topics Concern  . Not on file  Social History Narrative  . Not on file    Family History: Family History  Problem Relation Age of Onset  . Hypertension Mother   . Hypertension Father   . Hypertension Brother     Allergies:  No Known Allergies  Medications Prior to Admission  Medication Sig Dispense Refill Last Dose  . aspirin EC 81 MG tablet Take 1 tablet (81 mg total) by mouth daily. 90 tablet 3   . emtricitabine-tenofovir (TRUVADA) 200-300 MG tablet Take 1 tablet by mouth daily. 30 tablet 8   . hydrOXYzine (VISTARIL) 50 MG capsule Take 1 capsule (50 mg total) by mouth 3 (three) times daily as needed. 30 capsule 0   . ISENTRESS 400 MG tablet      . labetalol (NORMODYNE) 200 MG tablet Take 1 tablet (200 mg total) by mouth 2 (two) times daily. 60 tablet 3   . metFORMIN (GLUCOPHAGE) 500 MG tablet Take 1 pill at bedtime every night. 30 tablet 6   . [START ON 09/14/2018] orlistat (XENICAL) 120 MG capsule Take 1 capsule (120 mg total) by mouth 3 (three) times daily with meals. 90 capsule `   . Prenatal Vit-Fe Fumarate-FA (PRENATAL VITAMIN) 27-0.8 MG TABS Take 1 tablet by mouth daily. 90 tablet 3    Review of Systems  All systems reviewed and negative except as  stated in HPI  Physical Exam Blood pressure (!) 147/93, pulse (!) 101, temperature 98.4 F (36.9 C), temperature source Oral, resp. rate 19, height 5\' 6"  (1.676 m), weight 127.1 kg, last menstrual period 12/01/2017. General appearance: Alert, oriented, NAD Lungs: Normal respiratory effort Heart: Regular rate Abdomen: Soft, non-tender; gravid, FH appropriate for GA Extremities: No calf swelling or tenderness Presentation: Cephalic Fetal monitoring: 150, mod var, + accel, -decel  Uterine activity: irregular     Prenatal labs: ABO, Rh: --/--/O POS, O POS Performed at [facility] [address], [place], [place] [address]  [id number]/13 1845) Antibody: NEG (07/13 1845) Rubella: 8.18 (03/03 1611) RPR: Non Reactive (04/15 0823)  HBsAg: Negative (03/03 1611)  HIV: Reactive (04/15 0823)  GC/Chlamydia: Negative GBS:   Negative 2-hr GTT: Positive for GDM  Genetic screening:  NIPT  Anatomy US: No fetal anomalies  Prenatal Transfer Tool  Maternal Diabetes: Yes:  Diabetes Type:  Insulin/Medication controlled Genetic Screening: Normal Maternal Ultrasounds/Referrals: Other:Oligohydramnios Fetal Ultrasounds or other Referrals:  None Maternal Substance Abuse:  No Significant Maternal Medications:  Meds include: Metformin, Labetalol, ASA, Truvada Significant Maternal Lab Results: Group B Strep negative and HIV positive  Results for orders placed or performed during the hospital encounter of 08/30/18 (from the past 24 hour(s))  CBC   Collection Time: 08/30/18  6:30 PM  Result Value Ref Range   WBC 6.9 4.0 - 10.5 K/uL   RBC 3.53 (L) 3.87 - 5.11 MIL/uL   Hemoglobin 10.7 (L) 12.0 - 15.0 g/dL   HCT 33.0 (L) 36.0 - 46.0 %   MCV 93.5 80.0 - 100.0 fL   MCH 30.3 26.0 - 34.0 pg   MCHC 32.4 30.0 - 36.0 g/dL   RDW 13.8 11.5 - 15.5 %   Platelets 200 150 - 400 K/uL   nRBC 0.0 0.0 - 0.2 %  Type and screen   Collection Time: 08/30/18  6:45 PM  Result Value Ref Range   ABO/RH(D) O POS     Antibody Screen NEG    Sample Expiration      09/02/2018,2359 Performed at [facility], [place] [address] [place], [place] [address]   ABO/Rh   Collection Time: 08/30/18  6:45 PM  Result Value Ref Range   ABO/RH(D)      O POS Performed at [facility] [address] [place], [place]  [address]     Patient Active Problem List   Diagnosis Date Noted  . GDM (gestational diabetes mellitus) 08/30/2018  . Unwanted fertility 07/28/2018  . BMI 40.0-44.9, adult ([place]) 06/16/2018  . Gestational diabetes mellitus (GDM) 06/04/2018  . Late prenatal care, antepartum 04/21/2018  . Supervision of high risk pregnancy, antepartum 04/20/2018  . Chronic hypertension in pregnancy 04/20/2018  . 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 Advanced maternal age in multigravida 04/20/2018  . Vision changes 06/10/2017  . Insomnia 04/07/2016  . Sebaceous cyst of ear 09/08/2014  . Obesity in pregnancy 11/18/2010  . Hyperlipidemia 10/06/2008  . Essential hypertension 04/22/2006  . Human immunodeficiency virus (HIV) disease ([place]) 12/24/2005    Assessment: Courtney Chavez is a 43 y.o. [id number] at [redacted]w[redacted]d here for IOL secondary to oligohydramnios and BPP 6/8 during evaluation with MFM today. Pregnancy complicated by HIV, C1ULA, cHTN, and AMA.   #Labor: Will start with cytotec. Challenging cervical exam not favorable for FB at this time, may attempt on next check pending exam.  #Pain: Desires natural delivery  #FWB:  Cat 1 #ID:  GBS negative  #MOF:  Formula feeding  #MOC:  Desires a BTL, signed consent on 6/29, however no insurance, will discuss further.   #Circ: N/A   1. HIV: Chronic, stable.  Last viral load undetectable in 05/2018. Follows with ID regularly.  --Cont on Truvada 200-300mg  daily  --Repeat HIV RNA quant  --Will not be breastfeeding   2. CHTN: Stable. Present prior to pregnancy, previously on HCTZ 25mg . BP 147/93 on admit. Asymptomatic.  --Monitor  BP --Cont Labetalol 200 BID  --CMP, UPC   3. A2GDM: Stable.  Metformin 500mg  daily at home with appropriate control. EFW 3217gm, 72% at [redacted]w[redacted]d.  --Monitor CBG q4 during latent phase  --Cont home Metformin 500mg  nightly, reassess postpartum     [name] 08/30/2018, 8:54 PM    I personally was present and/or re-performed the history, physical exam and medical decision-making activities of this service and have verified that the service and findings are accurately documented in the resident's note.   Mallie Snooks Certified Nurse Midwife Faculty Practice 08/30/18 10:11 PM

## 2018-08-30 NOTE — Progress Notes (Signed)
   PRENATAL VISIT NOTE  Subjective:  Courtney Chavez is a 43 y.o. G4P3003 at [redacted]w[redacted]d being seen today for ongoing prenatal care.  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  Patient reports occasional contractions.  Contractions: Irregular. Vag. Bleeding: None.  Movement: Present. Denies leaking of fluid.   The following portions of the patient's history were reviewed and updated as appropriate: allergies, current medications, past family history, past medical history, past social history, past surgical history and problem list.   Objective:   Vitals:   08/30/18 1437 08/30/18 1438  BP: (!) 142/96 126/88  Pulse: 90 90  Temp: 98.6 F (37 C)   Weight: 280 lb 9.6 oz (127.3 kg)     Fetal Status:     Movement: Present     General:  Alert, oriented and cooperative. Patient is in no acute distress.  Skin: Skin is warm and dry. No rash noted.   Cardiovascular: Normal heart rate noted  Respiratory: Normal respiratory effort, no problems with respiration noted  Abdomen: Soft, gravid, appropriate for gestational age.  Pain/Pressure: Present     Pelvic: Cervical exam performed        Extremities: Normal range of motion.  Edema: Trace  Mental Status: Normal mood and affect. Normal behavior. Normal judgment and thought content.   Assessment and Plan:  Pregnancy: G4P3003 at [redacted]w[redacted]d There are no diagnoses linked to this encounter. Term labor symptoms and general obstetric precautions  including but not limited to vaginal bleeding, contractions, leaking of fluid and fetal movement were reviewed in detail with the patient. Please refer to After Visit Summary for other counseling recommendations.   Return in about 4 weeks (around 09/27/2018).  Future Appointments  Date Time Provider [place]  08/30/2018  3:30 PM Baruch Gouty, RN [facility] MFC-US  08/30/2018  3:30 PM WH-MFC [place] 1 WH-MFCUS MFC-US  08/31/2018  7:30 AM MC-LD SCHED ROOM MC-INDC None  09/06/2018  3:15 PM [place] MFC-US  09/06/2018  3:15 PM [place] 4 WH-MFCUS MFC-US  Her dating was reviewed, LMP does not match 23 week [place]  Emeterio Reeve, MD

## 2018-08-30 NOTE — Patient Instructions (Signed)

## 2018-08-31 ENCOUNTER — Inpatient Hospital Stay (HOSPITAL_COMMUNITY): Payer: Self-pay

## 2018-08-31 ENCOUNTER — Inpatient Hospital Stay (HOSPITAL_COMMUNITY): Admission: AD | Admit: 2018-08-31 | Payer: Self-pay | Source: Home / Self Care | Admitting: Obstetrics & Gynecology

## 2018-08-31 ENCOUNTER — Encounter (HOSPITAL_COMMUNITY): Payer: Self-pay

## 2018-08-31 LAB — GLUCOSE, CAPILLARY: Glucose-Capillary: 117 mg/dL — ABNORMAL HIGH (ref 70–99)

## 2018-08-31 LAB — RPR: RPR Ser Ql: NONREACTIVE

## 2018-08-31 LAB — SARS CORONAVIRUS 2 BY RT PCR (HOSPITAL ORDER, PERFORMED IN ~~LOC~~ HOSPITAL LAB): SARS Coronavirus 2: NEGATIVE

## 2018-08-31 MED ORDER — SODIUM CHLORIDE 0.9% FLUSH: 3.0000 mL | Freq: Two times a day (BID) | INTRAVENOUS | Status: DC

## 2018-08-31 MED ORDER — DIBUCAINE (PERIANAL) 1 % EX OINT: 1.0000 "application " | TOPICAL_OINTMENT | CUTANEOUS | Status: DC | PRN

## 2018-08-31 MED ORDER — IBUPROFEN 600 MG PO TABS
600.0000 mg | ORAL_TABLET | Freq: Four times a day (QID) | ORAL | Status: DC
  Administered 2018-08-31 – 2018-09-01 (×7): 600 mg via ORAL
  Filled 2018-08-31 (×8): qty 1

## 2018-08-31 MED ORDER — ONDANSETRON HCL 4 MG PO TABS: 4.0000 mg | ORAL_TABLET | ORAL | Status: DC | PRN

## 2018-08-31 MED ORDER — SODIUM CHLORIDE 0.9% FLUSH: 3.0000 mL | INTRAVENOUS | Status: DC | PRN

## 2018-08-31 MED ORDER — FENTANYL CITRATE (PF) 100 MCG/2ML IJ SOLN
100.0000 ug | INTRAMUSCULAR | Status: DC | PRN
  Administered 2018-08-31 (×2): 100 ug via INTRAVENOUS
  Filled 2018-08-31 (×2): qty 2

## 2018-08-31 MED ORDER — PRENATAL MULTIVITAMIN CH
1.0000 | ORAL_TABLET | Freq: Every day | ORAL | Status: DC
  Administered 2018-08-31 – 2018-09-01 (×2): 1 via ORAL
  Filled 2018-08-31 (×2): qty 1

## 2018-08-31 MED ORDER — TETANUS-DIPHTH-ACELL PERTUSSIS 5-2.5-18.5 LF-MCG/0.5 IM SUSP: 0.5000 mL | Freq: Once | INTRAMUSCULAR | Status: DC

## 2018-08-31 MED ORDER — EMTRICITABINE-TENOFOVIR AF 200-25 MG PO TABS
1.0000 | ORAL_TABLET | Freq: Every day | ORAL | Status: DC
  Administered 2018-08-31 – 2018-09-02 (×3): 1 via ORAL
  Filled 2018-08-31 (×4): qty 1

## 2018-08-31 MED ORDER — MISOPROSTOL 200 MCG PO TABS
800.0000 ug | ORAL_TABLET | Freq: Once | ORAL | Status: AC
  Administered 2018-08-31: 06:00:00 800 ug via RECTAL

## 2018-08-31 MED ORDER — ONDANSETRON HCL 4 MG/2ML IJ SOLN: 4.0000 mg | INTRAMUSCULAR | Status: DC | PRN

## 2018-08-31 MED ORDER — BENZOCAINE-MENTHOL 20-0.5 % EX AERO
1.0000 "application " | INHALATION_SPRAY | CUTANEOUS | Status: DC | PRN
  Filled 2018-08-31: qty 56

## 2018-08-31 MED ORDER — ZOLPIDEM TARTRATE 5 MG PO TABS: 5.0000 mg | ORAL_TABLET | Freq: Every evening | ORAL | Status: DC | PRN

## 2018-08-31 MED ORDER — COCONUT OIL OIL: 1.0000 "application " | TOPICAL_OIL | Status: DC | PRN

## 2018-08-31 MED ORDER — MEASLES, MUMPS & RUBELLA VAC IJ SOLR: 0.5000 mL | Freq: Once | INTRAMUSCULAR | Status: DC

## 2018-08-31 MED ORDER — SODIUM CHLORIDE 0.9 % IV SOLN: 250.0000 mL | INTRAVENOUS | Status: DC | PRN

## 2018-08-31 MED ORDER — ACETAMINOPHEN 325 MG PO TABS: 650.0000 mg | ORAL_TABLET | ORAL | Status: DC | PRN

## 2018-08-31 MED ORDER — MISOPROSTOL 200 MCG PO TABS
ORAL_TABLET | ORAL | Status: AC
  Filled 2018-08-31: qty 1

## 2018-08-31 MED ORDER — SENNOSIDES-DOCUSATE SODIUM 8.6-50 MG PO TABS
2.0000 | ORAL_TABLET | ORAL | Status: DC
  Administered 2018-09-01 (×2): 2 via ORAL
  Filled 2018-08-31 (×2): qty 2

## 2018-08-31 MED ORDER — WITCH HAZEL-GLYCERIN EX PADS: 1.0000 "application " | MEDICATED_PAD | CUTANEOUS | Status: DC | PRN

## 2018-08-31 MED ORDER — SIMETHICONE 80 MG PO CHEW: 80.0000 mg | CHEWABLE_TABLET | ORAL | Status: DC | PRN

## 2018-08-31 MED ORDER — RALTEGRAVIR POTASSIUM 400 MG PO TABS
400.0000 mg | ORAL_TABLET | Freq: Two times a day (BID) | ORAL | Status: DC
  Administered 2018-08-31 – 2018-09-02 (×5): 400 mg via ORAL
  Filled 2018-08-31 (×6): qty 1

## 2018-08-31 MED ORDER — DIPHENHYDRAMINE HCL 25 MG PO CAPS: 25.0000 mg | ORAL_CAPSULE | Freq: Four times a day (QID) | ORAL | Status: DC | PRN

## 2018-08-31 NOTE — Progress Notes (Signed)
LABOR PROGRESS NOTE  Courtney Chavez is a 43 y.o. G4P3003 at [redacted]w[redacted]d admitted for IOL for oligohydramnios and BPP 6/8.   Subjective: Doing well, feeling some contractions.   Objective: BP (!) 148/91   Pulse 88   Temp 98.6 F (37 C) (Oral)   Resp 15   Ht 5\' 6"  (1.676 m)   Wt 127.1 kg   LMP 12/01/2017 (Approximate)   BMI 45.23 kg/m  or  Vitals:   08/30/18 2314 08/31/18 0017 08/31/18 0020 08/31/18 0102  BP: (!) 142/98 (!) 148/95  (!) 148/91  Pulse: 91 92  88  Resp: 16 15  15   Temp:   98.6 F (37 C)   TempSrc:   Oral   Weight:      Height:        Dilation: 2 Effacement (%): 30 Cervical Position: Posterior Station: -3 Presentation: Vertex Exam by:: Dr. Higinio Plan FHT: baseline rate 140-150, moderate varibility, +acel,  Occasional early decel Toco: irregular, every 5-10  Labs: Lab Results  Component Value Date   WBC 6.9 08/30/2018   HGB 10.7 (L) 08/30/2018   HCT 33.0 (L) 08/30/2018   MCV 93.5 08/30/2018   PLT 200 08/30/2018    Patient Active Problem List   Diagnosis Date Noted  . GDM (gestational diabetes mellitus) 08/30/2018  . Unwanted fertility 07/28/2018  . BMI 40.0-44.9, adult ([place]) 06/16/2018  . Gestational diabetes mellitus (GDM) 06/04/2018  . Late prenatal care, antepartum 04/21/2018  . Supervision of high risk pregnancy, antepartum 04/20/2018  . Chronic hypertension in pregnancy 04/20/2018  . 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 Advanced maternal age in multigravida 04/20/2018  . Vision changes 06/10/2017  . Insomnia 04/07/2016  . Sebaceous cyst of ear 09/08/2014  . Obesity in pregnancy 11/18/2010  . Hyperlipidemia 10/06/2008  . Essential hypertension 04/22/2006  . Human immunodeficiency virus (HIV) disease ([place]) 12/24/2005    Assessment / Plan: 43 y.o. G4P3003 at [redacted]w[redacted]d here for IOL due to oligo and BPP 6/8.   Labor: Minimal progression, will place Cytotec again (#2). Challenging cervical exam unfavorable for FB.   Fetal Wellbeing:  Cat 1 strip  Pain Control:  Natural, IV fent as needed  Anticipated MOD:  SVD   1. CHTN: Stable.  Last BP 148/91, remaining <160/110. Asymptomatic. [place] labs wnl.  --Monitor BP --Cont Labetalol 200mg  BID   2. A2GDM: Stable.  Last CBG 117.  --Monitor CBG q4   3. HIV: Chronic, stable.  Viral load undetectable in 05/2018.  --F/u HIV RNA quant   Darrelyn Hillock, D.O. Family Medicine PGY-2  08/31/2018, 2:08 AM

## 2018-08-31 NOTE — Consult Note (Signed)
Neonatology Note:   Attendance at Delivery:    I was asked by Dr. Higinio Plan to attend this NSVD at term due to meconium-stained fluid. The mother is a G4P3 O pos, GBS neg with HIV X 14 years, on Truvada- viral load undetectable; GDM, on Metformin; CHTN, on Labetalol; obesity and late [facility]. ROM just before delivery, fluid with thin meconium. Infant vigorous with good spontaneous cry and tone. Delayed cord clamping was done. Our team arrived at 1.5 minutes, at which time the baby was dusky, but breathing fairly comfortably, with some nasal congestion. Bulb suctioned for scant clear fluid, then performed DeLee suctioning, getting about 4 ml of very light green fluid out. We placed a pulse oximeter and the O2 sat was 58% at about 5 minutes, so started BBO2. We did chest PT X 2 and bulb suctioned the nares and mouth several times, getting small amounts of light green mucous out, but the nasal congestion persisted and we could not wean the baby off supplemental O2 by 20 minutes of age. Lungs with a few rales, but mostly upper airway sounds were heard. Ap 7/8. I spoke with her mother in the DR and the baby was seen by her briefly, then transported to the NICU for further care.   Real Cons, MD

## 2018-08-31 NOTE — Discharge Summary (Addendum)
Postpartum Discharge Summary     Patient Name: Courtney Chavez DOB: [birth year] MRN: [id number]  Date of admission: 08/30/2018 Delivering Provider: [name]   Date of discharge: 09/02/2018  Admitting diagnosis: PREG Intrauterine pregnancy: [redacted]w[redacted]d     Secondary diagnosis:  Active Problems:   GDM (gestational diabetes mellitus)  Additional problems: HIV with viral load undetectable, cHTN      Discharge diagnosis: Term Pregnancy Delivered and CHTN                                                                                                Post partum procedures:none  Augmentation: Cytotec  Complications: None   Follow up:  [ ]  Blood pressure, Lab during pregnancy ,  Change to Procardia XL postpartum [ ]  2 hour GTT on follow up (A2GDM)   Hospital course:  Induction of Labor With Vaginal Delivery   43 y.o. yo [phone] at [redacted]w[redacted]d was admitted to the hospital 08/30/2018 for induction of labor.  Indication for induction: Oligohydramnios and BPP 6/8, additional cHTN and A2GDM.  Patient had an uncomplicated labor course as follows: Membrane Rupture Time/Date: 5:20 AM ,08/31/2018   Intrapartum Procedures: Episiotomy: None [1]                                         Lacerations:  2nd degree [3]  Patient had delivery of a Viable infant.  Information for the patient's newborn:  Kiannah, [place] Girl Laurenashley [[id number]]  Delivery Method: Vaginal, Spontaneous(Filed from Delivery Summary)    08/31/2018  Details of delivery can be found in separate delivery note.  Patient had a routine postpartum course. Patient is discharged home 09/02/18.  Magnesium Sulfate recieved: No BMZ received: No  Physical exam  Vitals:   09/01/18 0528 09/01/18 1420 09/01/18 2237 09/02/18 0515  BP: 125/72 (!) 151/78 (!) 145/91 (!) 148/94  Pulse: 70 73 89 84  Resp: 18 16 17 18   Temp: 97.9 F (36.6 C) 97.9 F (36.6 C) 98.3 F (36.8 C) 98.2 F (36.8 C)  TempSrc: Oral Oral Oral   SpO2: 100% 100% 100%    Weight:      Height:       General: alert, cooperative and no distress Lochia: appropriate Uterine Fundus: firm Incision: N/A DVT Evaluation: No significant calf/ankle edema. Labs: Lab Results  Component Value Date   WBC 6.9 08/30/2018   HGB 10.7 (L) 08/30/2018   HCT 33.0 (L) 08/30/2018   MCV 93.5 08/30/2018   PLT 200 08/30/2018   CMP Latest Ref Rng & Units 08/30/2018  Glucose 70 - 99 mg/dL 117(H)  BUN 6 - 20 mg/dL 5(L)  Creatinine 0.44 - 1.00 mg/dL 0.56  Sodium 135 - 145 mmol/L 133(L)  Potassium 3.5 - 5.1 mmol/L 3.6  [place] 98 - 111 mmol/L 105  CO2 22 - 32 mmol/L 19(L)  Calcium 8.9 - 10.3 mg/dL 8.8(L)  Total Protein 6.5 - 8.1 g/dL 6.4(L)  Total Bilirubin 0.3 - 1.2 mg/dL 0.4  Alkaline Phos 38 - 126 U/L  61  AST 15 - 41 U/L 22  ALT 0 - 44 U/L 18    Discharge instruction: per After Visit Summary and "Baby and Me Booklet".  After visit meds:  Allergies as of 09/02/2018   No Known Allergies     Medication List    STOP taking these medications   labetalol 200 MG tablet Commonly known as: NORMODYNE     TAKE these medications   acetaminophen 325 MG tablet Commonly known as: Tylenol Take 2 tablets (650 mg total) by mouth every 4 (four) hours as needed (for pain scale < 4).   aspirin EC 81 MG tablet Take 1 tablet (81 mg total) by mouth daily.   emtricitabine-tenofovir 200-300 MG tablet Commonly known as: TRUVADA Take 1 tablet by mouth daily.   hydrOXYzine 50 MG capsule Commonly known as: Vistaril Take 1 capsule (50 mg total) by mouth 3 (three) times daily as needed. What changed: reasons to take this   ibuprofen 600 MG tablet Commonly known as: ADVIL Take 1 tablet (600 mg total) by mouth every 6 (six) hours.   Isentress 400 MG tablet Generic drug: raltegravir Take 400 mg by mouth 2 (two) times daily.   metFORMIN 500 MG tablet Commonly known as: GLUCOPHAGE Take 1 pill at bedtime every night.   NIFEdipine 30 MG 24 hr tablet Commonly known as:  PROCARDIA-XL/NIFEDICAL-XL Take 1 tablet (30 mg total) by mouth daily.   orlistat 120 MG capsule Commonly known as: Xenical Take 1 capsule (120 mg total) by mouth 3 (three) times daily with meals. Start taking on: September 14, 2018   Prenatal Vitamin 27-0.8 MG Tabs Take 1 tablet by mouth daily.       Diet: routine diet  Activity: Advance as tolerated. Pelvic rest for 6 weeks.   Outpatient follow up:4 weeks Follow up Appt: Future Appointments  Date Time Provider [place]  09/07/2018  9:20 AM [place]  09/29/2018  8:55 AM Hillard Danker Myles Rosenthal, PA-C [facility]   Follow up Visit: [place] for [facility] Follow up in 2 week(s).   Specialty: Obstetrics and Gynecology Contact information: [address], [place] [id number] [place] [address] [phone]           Delivery mode:  SVD Anticipated Birth Control:  interval PP Procedures needed: BP check  Schedule Integrated BH visit: no      Newborn Data: Live born female  Birth Weight: 8 lb 15.2 oz (4060 g) APGAR: 7, 8  Newborn Delivery   Birth date/time: 08/31/2018 05:38:00 Delivery type: Vaginal, Spontaneous      Baby Feeding: Bottle Disposition:home with mother   09/02/2018 Hansel Feinstein, CNM .

## 2018-09-01 ENCOUNTER — Encounter: Payer: Self-pay | Admitting: Infectious Diseases

## 2018-09-01 NOTE — Progress Notes (Addendum)
Post Partum Day 1 Subjective: no complaints, up ad lib, voiding, tolerating PO and + flatus  Objective: Blood pressure 125/72, pulse 70, temperature 97.9 F (36.6 C), temperature source Oral, resp. rate 18, height 5\' 6"  (1.676 m), weight 127.1 kg, last menstrual period 12/01/2017, SpO2 100 %, unknown if currently breastfeeding.  Physical Exam:  General: alert, cooperative and NAD Lochia: appropriate Uterine Fundus: firm DVT Evaluation: no evidence of DVT seen on physical exam, no significant calf/ankle edema.  Recent Labs    08/30/18 1830  HGB 10.7*  HCT 33.0*    Assessment/Plan: Plan for discharge tomorrow cHTN - stable at this time; CCM with Labetalol 200 mg BID A2GDM - blood glucose checks within normal limits HIV - restarted home Truvada and Isentress; ID following Contraception - desires BTL but unsure if she will be able to complete this with current insurance; plan to address at [place]   LOS: 2 days   Genia Del 09/01/2018, 8:16 AM

## 2018-09-01 NOTE — Progress Notes (Signed)
CSW acknowledges consult for infants MOB having HIV diagnosis in the past. CSW went to speak with MOB at bedside to address further needs for self and for infant.   Upon entering the room CSW congratulated MOB on the birth of infant. CSW advised MOB of the HIPPA policy and CSW observed that someone was on the couch asleep. CSW was notified by MOB that this was her niece and that it was okay for her to stay despite not knowing about MOB's diagnosis. CSW advised MOB that CSW would wake niece up and ask her to leave the room so that CSW and MOB could have privacy. MOB insisted that CSW let niece sleep and asked that CSW not wake her. CSW understanding and chose works and terminology carefully while speaking with MOB.   MOB reports that she was diagnosed with HIV 14 years ago. MOB reports that FOB is aware of diagnosis also. MOB reports that she has been taking her medications as prescribed and has no concerns at this time. CSW advised MOB that it is hospital policy that we get infant set up with an Infectious Disease clinic to further monitors infant. MOB reported that she understood this and asked that CSW set the appointment up with [facility]). CSW advised MOB that CSW would. MOB denied having any mental health history at this time.    CSW provided MOB education on SIDS as well as PPD. MOB was given PPD Checklist for further monitoring of feelings. MOB was advised that infants appointment with [facility] is on September 24, 2018 at 10:30am. MOB reports no further concerns and positive supports from her family.      Virgie Dad Curtistine Pettitt, MSW, LCSW [facility] [place] [facility] [place] [phone]

## 2018-09-02 ENCOUNTER — Telehealth: Payer: Self-pay | Admitting: Obstetrics & Gynecology

## 2018-09-02 DIAGNOSIS — Z3A39 39 weeks gestation of pregnancy: Secondary | ICD-10-CM

## 2018-09-02 DIAGNOSIS — O4103X0 Oligohydramnios, third trimester, not applicable or unspecified: Secondary | ICD-10-CM

## 2018-09-02 LAB — HIV-1 RNA QUANT-NO REFLEX-BLD
HIV 1 RNA Quant: <20 copies/mL
LOG10 HIV-1 RNA: UNDETERMINED log10copy/mL

## 2018-09-02 MED ORDER — ACETAMINOPHEN 325 MG PO TABS: 650.0000 mg | ORAL_TABLET | ORAL | 0 refills | Status: DC | PRN

## 2018-09-02 MED ORDER — NIFEDIPINE ER OSMOTIC RELEASE 30 MG PO TB24: 30.0000 mg | ORAL_TABLET | Freq: Every day | ORAL | 11 refills | Status: DC

## 2018-09-02 MED ORDER — IBUPROFEN 600 MG PO TABS: 600.0000 mg | ORAL_TABLET | Freq: Four times a day (QID) | ORAL | 0 refills | Status: DC

## 2018-09-02 MED FILL — NIFEdipine ER 30 MG TB24: 30 | 30 days supply | Qty: 30 | Fill #0

## 2018-09-02 NOTE — Discharge Instructions (Signed)

## 2018-09-03 LAB — HIV-1 RNA ULTRAQUANT REFLEX TO GENTYP+
HIV 1 RNA Quant: <20 copies/mL
HIV-1 RNA Quant, Log: <1.3 Log copies/mL

## 2018-09-06 ENCOUNTER — Ambulatory Visit (HOSPITAL_COMMUNITY): Payer: Self-pay

## 2018-09-06 ENCOUNTER — Telehealth: Payer: Self-pay | Admitting: Family Medicine

## 2018-09-06 NOTE — Telephone Encounter (Signed)
Attempted to call patient about her appointment on 7/21 @ 9:20. No answer, left voicemail instructing patient to wear a face mask for the entire appointment and no visitors are allowed during the visit. Patient was no screened due to no answer. Patient instructed is she has any symptoms not to attend the appointment and give the office a call back to be rescheduled. Symptom list and office number left.

## 2018-09-07 ENCOUNTER — Encounter: Payer: Self-pay | Admitting: Student

## 2018-09-07 ENCOUNTER — Ambulatory Visit (INDEPENDENT_AMBULATORY_CARE_PROVIDER_SITE_OTHER): Payer: Self-pay

## 2018-09-07 ENCOUNTER — Other Ambulatory Visit: Payer: Self-pay

## 2018-09-07 VITALS — BP 172/108 | HR 89 | Wt 256.2 lb

## 2018-09-07 DIAGNOSIS — O10919 Unspecified pre-existing hypertension complicating pregnancy, unspecified trimester: Secondary | ICD-10-CM

## 2018-09-07 MED ORDER — HYDROCHLOROTHIAZIDE 25 MG PO TABS: 25.0000 mg | ORAL_TABLET | Freq: Every day | ORAL | 0 refills | Status: DC

## 2018-09-07 NOTE — Progress Notes (Signed)
Pt here today BP check.  Pt denies any sx's of HTN.  Pt reports taking Procardia once a day and due to pt not taking until 10am she reports not taking this am.  BP RA 157/111 dynamap.  Rpt BP LA 172/108 RA.  Notified Maye Hides, CNM who recommended that the pt get labs drawn today- CBC, CMP, and PCR, add HCTZ 25 mg po tablet daily, come back on 09/09/18 for BP check, take medication earlier than 10 am, and to check her BP at home in the am.  Notified pt provider's recommendations.  Pt verbalized understanding and was able to rpt how she should take her BP medication properly.     Mel Almond, RN 09/07/18

## 2018-09-07 NOTE — Progress Notes (Unsigned)
Patient Courtney Chavez is a 43 y.o. G4P4004 At 5 days PP after NSVD. She is a chronic hypertensive; she was discharged home from [facility] on Procardia Xl. She has not taken it today (she takes it at 10 am). Her BP today severe ranges 170/100s. She denies symptoms. She insists that she wants to be on HCTZ, as that has worked for her before. She would like to stop Procardia and resume HCTZ.   Discussed with Dr. Roselie Awkward, who recommends continue to take her BP medicine earlier in the day, plus add HCTZ. Will draw Pre-e labs

## 2018-09-08 ENCOUNTER — Telehealth: Payer: Self-pay | Admitting: Family Medicine

## 2018-09-08 ENCOUNTER — Encounter: Payer: Self-pay | Admitting: Obstetrics and Gynecology

## 2018-09-08 LAB — COMPREHENSIVE METABOLIC PANEL
ALT: 22 IU/L (ref 0–32)
AST: 15 IU/L (ref 0–40)
Albumin/Globulin Ratio: 1.4 (ref 1.2–2.2)
Albumin: 3.8 g/dL (ref 3.8–4.8)
Alkaline Phosphatase: 74 IU/L (ref 39–117)
BUN/Creatinine Ratio: 12 (ref 9–23)
BUN: 9 mg/dL (ref 6–24)
Bilirubin Total: <0.2 mg/dL (ref 0.0–1.2)
CO2: 21 mmol/L (ref 20–29)
Calcium: 9.4 mg/dL (ref 8.7–10.2)
Chloride: 101 mmol/L (ref 96–106)
Creatinine, Ser: 0.73 mg/dL (ref 0.57–1.00)
GFR calc Af Amer: 117 mL/min/{1.73_m2} (ref >59)
GFR calc non Af Amer: 101 mL/min/{1.73_m2} (ref >59)
Globulin, Total: 2.8 g/dL (ref 1.5–4.5)
Glucose: 113 mg/dL — ABNORMAL HIGH (ref 65–99)
Potassium: 4.2 mmol/L (ref 3.5–5.2)
Sodium: 139 mmol/L (ref 134–144)
Total Protein: 6.6 g/dL (ref 6.0–8.5)

## 2018-09-08 LAB — CBC
Hematocrit: 34.2 % (ref 34.0–46.6)
Hemoglobin: 11.3 g/dL (ref 11.1–15.9)
MCH: 29.5 pg (ref 26.6–33.0)
MCHC: 33 g/dL (ref 31.5–35.7)
MCV: 89 fL (ref 79–97)
Platelets: 321 10*3/uL (ref 150–450)
RBC: 3.83 x10E6/uL (ref 3.77–5.28)
RDW: 13.6 % (ref 11.7–15.4)
WBC: 7.8 10*3/uL (ref 3.4–10.8)

## 2018-09-08 LAB — PROTEIN / CREATININE RATIO, URINE
Creatinine, Urine: 255 mg/dL
Protein, Ur: 41.2 mg/dL
Protein/Creat Ratio: 162 mg/g creat (ref 0–200)

## 2018-09-08 NOTE — Telephone Encounter (Signed)
Spoke with patient about her appointment on 7/23 @ 1:50. Patient instructed to wear a face mask for the entire appointment and no visitors are allowed. Patient screened for covid symptoms and denied having any.

## 2018-09-09 ENCOUNTER — Other Ambulatory Visit: Payer: Self-pay

## 2018-09-09 ENCOUNTER — Ambulatory Visit (INDEPENDENT_AMBULATORY_CARE_PROVIDER_SITE_OTHER): Payer: Self-pay | Admitting: General Practice

## 2018-09-09 VITALS — BP 137/91 | HR 98

## 2018-09-09 DIAGNOSIS — Z0130 Encounter for examination of blood pressure without abnormal findings: Secondary | ICD-10-CM

## 2018-09-09 NOTE — Progress Notes (Addendum)
Patient presents to office today for repeat BP check following elevated blood pressure's at visit on 7/21. Patient was started on HCTZ at that visit and continues to take procardia as previously prescribed. Patient states she has been taking her medication daily and took it at 10am today. She states she took her BP at home around 12:30 and it was 131/88. Patient reports mild headache the other day but attributes it to being tired because it went away with rest and ibuprofen. Denies dizziness or blurry vision- no edema noted today. Discussed with Dr Ihor Dow who states patient should continue medications and take BP from home in a 1 week.   Discussed with patient and told her to call us if BP was elevated 140/90 or higher or if she developed severe headaches, dizziness or blurry vision. Patient verbalized understanding & had no questions.  Koren Bound RN BSN 09/09/18  Attestation of Attending Supervision of RN: Evaluation and management procedures were performed by the nurse under my supervision and collaboration.  I have reviewed the nursing note and chart, and I agree with the management and plan.  Carolyn L. Harraway-Smith, M.D., [place]lynn June

## 2018-09-14 NOTE — Progress Notes (Signed)
Chart reviewed for nurse visit. Agree with plan of care.   [place], [place] 09/14/2018 3:48 PM

## 2018-09-28 ENCOUNTER — Telehealth: Payer: Self-pay | Admitting: Advanced Practice Midwife

## 2018-09-28 NOTE — Telephone Encounter (Signed)
Called the patient to inform of the upcoming appointment. Informed of how to log in to the mychart app and also advised please log in at least 15 minutes prior to the appointment time. If the provider does not log in 15 minutes after the appointment time, please call our office. The patient verbalized understanding.

## 2018-09-29 ENCOUNTER — Other Ambulatory Visit: Payer: Self-pay

## 2018-09-29 ENCOUNTER — Telehealth (INDEPENDENT_AMBULATORY_CARE_PROVIDER_SITE_OTHER): Payer: Self-pay | Admitting: Medical

## 2018-09-29 ENCOUNTER — Encounter: Payer: Self-pay | Admitting: Medical

## 2018-09-29 DIAGNOSIS — Z8632 Personal history of gestational diabetes: Secondary | ICD-10-CM | POA: Insufficient documentation

## 2018-09-29 DIAGNOSIS — Z6841 Body Mass Index (BMI) 40.0 and over, adult: Secondary | ICD-10-CM

## 2018-09-29 DIAGNOSIS — B20 Human immunodeficiency virus [HIV] disease: Secondary | ICD-10-CM

## 2018-09-29 DIAGNOSIS — I10 Essential (primary) hypertension: Secondary | ICD-10-CM

## 2018-09-29 NOTE — Progress Notes (Signed)
I connected with Courtney Chavez on 09/29/18 at  8:55 AM EDT by: WebEx and verified that I am speaking with the correct person using two identifiers.  Patient is located at home and provider is located at [facility].     The purpose of this virtual visit is to provide medical care while limiting exposure to the novel coronavirus. I discussed the limitations, risks, security and privacy concerns of performing an evaluation and management service by WebEx and the availability of in person appointments. I also discussed with the patient that there may be a patient responsible charge related to this service. By engaging in this virtual visit, you consent to the provision of healthcare.  Additionally, you authorize for your insurance to be billed for the services provided during this visit.  The patient expressed understanding and agreed to proceed.  The following staff members participated in the virtual visit:  Carver Fila, [place] Partum Visit Note Subjective:    [name] is a 43 y.o. [phone] female who presents for a postpartum visit. She is 4 weeks postpartum following a spontaneous vaginal delivery. I have fully reviewed the prenatal and intrapartum course. The delivery was at 5 gestational weeks. Outcome: spontaneous vaginal delivery. Anesthesia: none. Postpartum course has been normal. Baby's course has been normal. Baby is feeding by bottle - [employer] Good Start. Bleeding no bleeding. Bowel function is normal. Bladder function is normal. Patient is not sexually active. Contraception method is none. Postpartum depression screening: negative.  The following portions of the patient's history were reviewed and updated as appropriate: allergies, current medications, past family history, past medical history, past social history, past surgical history and problem list.  Review of Systems Pertinent items are noted in HPI.   Objective:   Vitals:   09/29/18 0855  BP: (!) 124/99  Pulse: 80    Self-Obtained      Repeat BP is 135/80 Assessment:   Normal postpartum exam. Pap smear not done at today's visit. Last pap smear 2018 and results were Normal. Next pap due 2021.  CHTN History of GDM  HIV Obesity  Plan:   1. Contraception: condoms until IUD insetion 2. Patient desires IUD, will schedule to return for insertion 3. Continue HCTZ and Procardia for [facility] and follow-up with PCP 4. Continue Truvada and follow-up with ID  5. Follow up in: 2 weeks for IUD insertion and 2 hour GTT or sooner as needed.   Luvenia Redden, PA-C 09/29/2018 9:26 AM

## 2018-09-29 NOTE — Patient Instructions (Signed)
Levonorgestrel intrauterine device (IUD) What is this medicine? LEVONORGESTREL IUD (LEE voe nor jes trel) is a contraceptive (birth control) device. The device is placed inside the uterus by a healthcare professional. It is used to prevent pregnancy. This device can also be used to treat heavy bleeding that occurs during your period. This medicine may be used for other purposes; ask your health care provider or pharmacist if you have questions. COMMON BRAND NAME(S): Kyleena, LILETTA, Mirena, Skyla What should I tell my health care provider before I take this medicine? They need to know if you have any of these conditions:  abnormal Pap smear  cancer of the breast, uterus, or cervix  diabetes  endometritis  genital or pelvic infection now or in the past  have more than one sexual partner or your partner has more than one partner  heart disease  history of an ectopic or tubal pregnancy  immune system problems  IUD in place  liver disease or tumor  problems with blood clots or take blood-thinners  seizures  use intravenous drugs  uterus of unusual shape  vaginal bleeding that has not been explained  an unusual or allergic reaction to levonorgestrel, other hormones, silicone, or polyethylene, medicines, foods, dyes, or preservatives  pregnant or trying to get pregnant  breast-feeding How should I use this medicine? This device is placed inside the uterus by a health care professional. Talk to your pediatrician regarding the use of this medicine in children. Special care may be needed. Overdosage: If you think you have taken too much of this medicine contact a poison control center or emergency room at once. NOTE: This medicine is only for you. Do not share this medicine with others. What if I miss a dose? This does not apply. Depending on the brand of device you have inserted, the device will need to be replaced every 3 to 6 years if you wish to continue using this type  of birth control. What may interact with this medicine? Do not take this medicine with any of the following medications:  amprenavir  bosentan  fosamprenavir This medicine may also interact with the following medications:  aprepitant  armodafinil  barbiturate medicines for inducing sleep or treating seizures  bexarotene  boceprevir  griseofulvin  medicines to treat seizures like carbamazepine, ethotoin, felbamate, oxcarbazepine, phenytoin, topiramate  modafinil  pioglitazone  rifabutin  rifampin  rifapentine  some medicines to treat HIV infection like atazanavir, efavirenz, indinavir, lopinavir, nelfinavir, tipranavir, ritonavir  St. John's wort  warfarin This list may not describe all possible interactions. Give your health care provider a list of all the medicines, herbs, non-prescription drugs, or dietary supplements you use. Also tell them if you smoke, drink alcohol, or use illegal drugs. Some items may interact with your medicine. What should I watch for while using this medicine? Visit your doctor or health care professional for regular check ups. See your doctor if you or your partner has sexual contact with others, becomes HIV positive, or gets a sexual transmitted disease. This product does not protect you against HIV infection (AIDS) or other sexually transmitted diseases. You can check the placement of the IUD yourself by reaching up to the top of your vagina with clean fingers to feel the threads. Do not pull on the threads. It is a good habit to check placement after each menstrual period. Call your doctor right away if you feel more of the IUD than just the threads or if you cannot feel the threads at   all. The IUD may come out by itself. You may become pregnant if the device comes out. If you notice that the IUD has come out use a backup birth control method like condoms and call your health care provider. Using tampons will not change the position of the  IUD and are okay to use during your period. This IUD can be safely scanned with magnetic resonance imaging (MRI) only under specific conditions. Before you have an MRI, tell your healthcare provider that you have an IUD in place, and which type of IUD you have in place. What side effects may I notice from receiving this medicine? Side effects that you should report to your doctor or health care professional as soon as possible:  allergic reactions like skin rash, itching or hives, swelling of the face, lips, or tongue  fever, flu-like symptoms  genital sores  high blood pressure  no menstrual period for 6 weeks during use  pain, swelling, warmth in the leg  pelvic pain or tenderness  severe or sudden headache  signs of pregnancy  stomach cramping  sudden shortness of breath  trouble with balance, talking, or walking  unusual vaginal bleeding, discharge  yellowing of the eyes or skin Side effects that usually do not require medical attention (report to your doctor or health care professional if they continue or are bothersome):  acne  breast pain  change in sex drive or performance  changes in weight  cramping, dizziness, or faintness while the device is being inserted  headache  irregular menstrual bleeding within first 3 to 6 months of use  nausea This list may not describe all possible side effects. Call your doctor for medical advice about side effects. You may report side effects to FDA at 1-800-FDA-1088. Where should I keep my medicine? This does not apply. NOTE: This sheet is a summary. It may not cover all possible information. If you have questions about this medicine, talk to your doctor, pharmacist, or health care provider.  2020 Elsevier/Gold Standard (2017-12-15 13:22:01)  

## 2018-09-29 NOTE — Progress Notes (Signed)
Pt states has changed her mind about getting a Tubal, now she wants the IUD.

## 2018-10-11 ENCOUNTER — Other Ambulatory Visit: Payer: Self-pay | Admitting: General Practice

## 2018-10-11 DIAGNOSIS — Z8632 Personal history of gestational diabetes: Secondary | ICD-10-CM

## 2018-10-12 ENCOUNTER — Telehealth: Payer: Self-pay | Admitting: Medical

## 2018-10-12 NOTE — Telephone Encounter (Signed)
Spoke to patient about her appointment on 8/26 @ 8:20. Patient instructed to wear a face mask for the entire appointment and no visitors are allowed with her during the visit. Patient screened for covid symptoms and denied having any. Patient instructed to come fasting.

## 2018-10-13 ENCOUNTER — Ambulatory Visit: Payer: Self-pay | Admitting: Medical

## 2018-10-13 ENCOUNTER — Other Ambulatory Visit: Payer: Self-pay

## 2018-12-31 ENCOUNTER — Other Ambulatory Visit: Payer: Self-pay

## 2018-12-31 ENCOUNTER — Telehealth: Payer: Self-pay

## 2018-12-31 ENCOUNTER — Other Ambulatory Visit: Payer: Self-pay | Admitting: Infectious Diseases

## 2018-12-31 DIAGNOSIS — B20 Human immunodeficiency virus [HIV] disease: Secondary | ICD-10-CM

## 2018-12-31 MED ORDER — ISENTRESS 400 MG PO TABS: 400.0000 mg | ORAL_TABLET | Freq: Two times a day (BID) | ORAL | 0 refills | Status: DC

## 2018-12-31 NOTE — Telephone Encounter (Signed)
Received refill request for patient's Truvada from [facility]. Per last office note MD notes patient is supposed to be on Isentress/ Truvada, and then switched to 3 pill regimen after delivery of her baby. Scheduled patient an appointment to meet Janene Madeira, NP regarding medication and follow up.  [place]

## 2018-12-31 NOTE — Telephone Encounter (Signed)
Perfect - will take care of switching her back to something easier at her appt. We can offer her a video visit if she would be interested with [employer]. Since she has a new little one at home to take care of this may be more convenient for her.  Would plan on a lab visit 6 weeks after changing her back .

## 2019-01-04 ENCOUNTER — Ambulatory Visit: Payer: Self-pay | Admitting: Infectious Diseases

## 2019-01-25 ENCOUNTER — Telehealth (INDEPENDENT_AMBULATORY_CARE_PROVIDER_SITE_OTHER): Payer: Self-pay | Admitting: Infectious Diseases

## 2019-01-25 ENCOUNTER — Other Ambulatory Visit: Payer: Self-pay

## 2019-01-25 ENCOUNTER — Encounter: Payer: Self-pay | Admitting: Infectious Diseases

## 2019-01-25 DIAGNOSIS — I10 Essential (primary) hypertension: Secondary | ICD-10-CM

## 2019-01-25 DIAGNOSIS — B20 Human immunodeficiency virus [HIV] disease: Secondary | ICD-10-CM

## 2019-01-25 MED ORDER — LISINOPRIL-HYDROCHLOROTHIAZIDE 20-12.5 MG PO TABS: 1.0000 | ORAL_TABLET | Freq: Every day | ORAL | 3 refills | Status: DC

## 2019-01-25 MED ORDER — TRIUMEQ 600-50-300 MG PO TABS: 1.0000 | ORAL_TABLET | Freq: Every day | ORAL | 11 refills | Status: DC

## 2019-01-25 NOTE — Progress Notes (Signed)
Name: Jahda Joas, female MRN: [id number]  DOB: 20-Feb-1975    Virtual Visit via Video Note  I connected with [name] on 01/25/19 at  2:45 PM EST by a video enabled telemedicine application and verified that I am speaking with the correct person using two identifiers.  Location: Patient: Home in [place] Provider: [place]    I discussed the limitations of evaluation and management by telemedicine and the availability of in person appointments. The patient expressed understanding and agreed to proceed.  History of Present Illness: Rhianne Legro is a 43 y.o. female with very well controlled HIV currently on Isentress BID + Truvada QD following delivery of her daughter back in July 2020. She has been recovering well and trying to lose some "baby fat" with intermittent fasting, ketogenic diet and exercise. She is ready to consolidate her HIV medication back to a single tablet regimen. Previously on Triumeq once a day and tolerated this well. She would like to go back on this medication if possible.   She also is requesting to go back on her previous blood pressure medication as the HCTZ is out of pocket for her. She checks her BP at home and today her reading was 122/94.   Last pap smear 01/2017. All negative x 3 screenings.  Has not yet had a mammogram.   Baby is doing well and growing fast. Sleeping most nights. She is formula feeding.   Review of Systems  Constitutional: Negative for appetite change, chills, fatigue, fever and unexpected weight change.  HENT: Negative for mouth sores, sore throat and trouble swallowing.   Eyes: Negative for pain and visual disturbance.  Respiratory: Negative for cough and shortness of breath.   Cardiovascular: Negative for chest pain.  Gastrointestinal: Negative for abdominal pain, diarrhea and nausea.  Genitourinary: Negative for dysuria, menstrual problem and pelvic pain.  Musculoskeletal: Negative for back pain and neck pain.   Skin: Negative for color change and rash.  Neurological: Negative for weakness, numbness and headaches.  Hematological: Negative for adenopathy.  Psychiatric/Behavioral: Negative for dysphoric mood. The patient is not nervous/anxious.       Observations/Objective: There were no vitals filed for this visit. There is no height or weight on file to calculate BMI.   Physical Exam Constitutional:      General: She is not in acute distress.    Appearance: Normal appearance. She is obese. She is not ill-appearing.  HENT:     Head: Normocephalic.     Nose: Nose normal.     Mouth/Throat:     Mouth: Mucous membranes are moist.     Pharynx: Oropharynx is clear.  Eyes:     General: No scleral icterus. Pulmonary:     Effort: Pulmonary effort is normal.  Neurological:     Mental Status: She is alert and oriented to person, place, and time.  Psychiatric:        Mood and Affect: Mood normal.        Thought Content: Thought content normal.        Judgment: Judgment normal.      Assessment and Plan: Problem List Items Addressed This Visit      Unprioritized   Human immunodeficiency virus (HIV) disease ([place])    Will change her back to Triumeq and have her return in 2 months to see Dr.Hatcher with labs same day.  She is doing well post partum and bottle feeding baby girl.       Relevant Medications  abacavir-dolutegravir-lamiVUDine (TRIUMEQ) 600-50-300 MG tablet   Essential hypertension    Change her back to lisinopril-HCTZ combo 20-12.5 mg. Her BP at home today is 122/94 on HCTZ 25 mg currently. Informed her that if she achieves more weight loss she may not need this much medication - should she have BPs consistently < 110/80 to notify on MyChart so we can decrease dose of her lisinopril.       Relevant Medications   lisinopril-hydrochlorothiazide (ZESTORETIC) 20-12.5 MG tablet       Follow Up Instructions: 6m with Dr. Johnnye Sima to repeat VL and check BP.     I discussed the  assessment and treatment plan with the patient. The patient was provided an opportunity to ask questions and all were answered. The patient agreed with the plan and demonstrated an understanding of the instructions.   The patient was advised to call back or seek an in-person evaluation if the symptoms worsen or if the condition fails to improve as anticipated.  I provided 15 minutes of non-face-to-face time during this encounter.   Janene Madeira, MSN, NP-C [facility] for Infectious Disease [email or web address] Pager: [phone] Office: [phone] [place]: [phone]

## 2019-01-25 NOTE — Assessment & Plan Note (Signed)
Will change her back to Triumeq and have her return in 2 months to see Dr.Hatcher with labs same day.  She is doing well post partum and bottle feeding baby girl.

## 2019-01-25 NOTE — Assessment & Plan Note (Signed)
Change her back to lisinopril-HCTZ combo 20-12.5 mg. Her BP at home today is 122/94 on HCTZ 25 mg currently. Informed her that if she achieves more weight loss she may not need this much medication - should she have BPs consistently < 110/80 to notify on MyChart so we can decrease dose of her lisinopril.

## 2019-01-25 NOTE — Patient Instructions (Signed)
BP today 120/94

## 2019-03-01 ENCOUNTER — Ambulatory Visit: Payer: Self-pay

## 2019-03-01 ENCOUNTER — Other Ambulatory Visit: Payer: Self-pay

## 2019-03-02 ENCOUNTER — Encounter: Payer: Self-pay | Admitting: Infectious Diseases

## 2019-04-04 ENCOUNTER — Ambulatory Visit: Payer: Self-pay | Admitting: Infectious Diseases

## 2019-04-06 ENCOUNTER — Encounter: Payer: Self-pay | Admitting: Infectious Diseases

## 2019-04-06 ENCOUNTER — Ambulatory Visit (INDEPENDENT_AMBULATORY_CARE_PROVIDER_SITE_OTHER): Payer: Self-pay | Admitting: Infectious Diseases

## 2019-04-06 ENCOUNTER — Other Ambulatory Visit: Payer: Self-pay

## 2019-04-06 VITALS — BP 138/91 | HR 67 | Temp 99.0°F | Wt 257.0 lb

## 2019-04-06 DIAGNOSIS — Z113 Encounter for screening for infections with a predominantly sexual mode of transmission: Secondary | ICD-10-CM

## 2019-04-06 DIAGNOSIS — B20 Human immunodeficiency virus [HIV] disease: Secondary | ICD-10-CM

## 2019-04-06 DIAGNOSIS — Z3009 Encounter for other general counseling and advice on contraception: Secondary | ICD-10-CM

## 2019-04-06 DIAGNOSIS — H539 Unspecified visual disturbance: Secondary | ICD-10-CM

## 2019-04-06 DIAGNOSIS — I10 Essential (primary) hypertension: Secondary | ICD-10-CM

## 2019-04-06 DIAGNOSIS — Z6841 Body Mass Index (BMI) 40.0 and over, adult: Secondary | ICD-10-CM

## 2019-04-06 DIAGNOSIS — Z79899 Other long term (current) drug therapy: Secondary | ICD-10-CM

## 2019-04-06 DIAGNOSIS — Z23 Encounter for immunization: Secondary | ICD-10-CM

## 2019-04-06 MED ORDER — NIFEDIPINE ER OSMOTIC RELEASE 30 MG PO TB24: 30.0000 mg | ORAL_TABLET | Freq: Every day | ORAL | 11 refills | Status: DC

## 2019-04-06 NOTE — Assessment & Plan Note (Signed)
States she would like tubal ligation.  Will reach out to GYN colleagues to see if we can assist with this.

## 2019-04-06 NOTE — Assessment & Plan Note (Signed)
Difficulty with night vision Will try to get her in with optho

## 2019-04-06 NOTE — Progress Notes (Signed)
   Subjective:    Patient ID: Courtney Chavez, female    DOB: [birth year], 44 y.o.   MRN: [id number]  HPI 44yo F from [place] came to [place] 2001. HIV+ and HTN.Also hx ofEFV/NVP resistance. Was changed to complerathen to triumeq due to wt gain.  Has 3sons(09-28-12).They are doing well. She was on Isentress- truvada while pregnant. She was then changed back to triumeq after delivering. She has 17mo daughter now. Tests (-). She has had f/u at [facility].  She was seen virtually recently and there were concerns about her BP- she was encouraged to lose wt.  She is hypertensive today on fever meds (lisinopril-hctz).  Has had headaches as her daughter is keeping her up at night.  Not breast feeding.   HIV 1 RNA Quant (copies/mL)  Date Value  08/30/2018 <20  08/24/2018 <20 NOT DETECTED  06/11/2018 <20 NOT DETECTED   CD4 T Cell Abs (/uL)  Date Value  06/11/2018 530  03/11/2018 720  01/20/2018 770    Review of Systems  Constitutional: Positive for unexpected weight change. Negative for appetite change.  Eyes: Positive for visual disturbance.  Respiratory: Negative for shortness of breath.   Cardiovascular: Negative for chest pain.  Gastrointestinal: Negative for constipation and diarrhea.  Genitourinary: Negative for difficulty urinating and vaginal bleeding.  Neurological: Positive for headaches.  Please see HPI. All other systems reviewed and negative.     Objective:   Physical Exam Constitutional:      Appearance: Normal appearance. She is obese.  HENT:     Mouth/Throat:     Mouth: Mucous membranes are moist.     Pharynx: No oropharyngeal exudate.  Eyes:     Extraocular Movements: Extraocular movements intact.     Pupils: Pupils are equal, round, and reactive to light.  Cardiovascular:     Rate and Rhythm: Normal rate and regular rhythm.  Pulmonary:     Effort: Pulmonary effort is normal.     Breath sounds: Normal breath sounds.  Abdominal:     General: Bowel sounds are  normal. There is no distension.     Palpations: Abdomen is soft.     Tenderness: There is no abdominal tenderness.  Musculoskeletal:     Cervical back: Normal range of motion.     Right lower leg: No edema.     Left lower leg: No edema.  Neurological:     General: No focal deficit present.     Mental Status: She is alert.  Psychiatric:        Mood and Affect: Mood normal.           Assessment & Plan:

## 2019-04-06 NOTE — Assessment & Plan Note (Signed)
Will give her flu vax today pcv 13 today Not sexually active.  Will check her labs today  rtc in 6 months.

## 2019-04-06 NOTE — Assessment & Plan Note (Signed)
Encouraged to diet and exercise.  Consider wt loss clinic eval.

## 2019-04-06 NOTE — Assessment & Plan Note (Signed)
Will ask her to resume the procardia with the lisinopril -hctz

## 2019-04-11 LAB — CBC
HCT: 37.7 % (ref 35.0–45.0)
Hemoglobin: 12.4 g/dL (ref 11.7–15.5)
MCH: 29.4 pg (ref 27.0–33.0)
MCHC: 32.9 g/dL (ref 32.0–36.0)
MCV: 89.3 fL (ref 80.0–100.0)
MPV: 11.2 fL (ref 7.5–12.5)
Platelets: 222 10*3/uL (ref 140–400)
RBC: 4.22 10*6/uL (ref 3.80–5.10)
RDW: 14.1 % (ref 11.0–15.0)
WBC: 4.8 10*3/uL (ref 3.8–10.8)

## 2019-04-11 LAB — COMPREHENSIVE METABOLIC PANEL
AG Ratio: 1.5 (calc) (ref 1.0–2.5)
ALT: 11 U/L (ref 6–29)
AST: 13 U/L (ref 10–30)
Albumin: 4.1 g/dL (ref 3.6–5.1)
Alkaline phosphatase (APISO): 40 U/L (ref 31–125)
BUN: 13 mg/dL (ref 7–25)
CO2: 27 mmol/L (ref 20–32)
Calcium: 9.1 mg/dL (ref 8.6–10.2)
Chloride: 104 mmol/L (ref 98–110)
Creat: 0.78 mg/dL (ref 0.50–1.10)
Globulin: 2.8 g/dL (calc) (ref 1.9–3.7)
Glucose, Bld: 104 mg/dL — ABNORMAL HIGH (ref 65–99)
Potassium: 4.2 mmol/L (ref 3.5–5.3)
Sodium: 138 mmol/L (ref 135–146)
Total Bilirubin: 0.3 mg/dL (ref 0.2–1.2)
Total Protein: 6.9 g/dL (ref 6.1–8.1)

## 2019-04-11 LAB — HIV-1 RNA QUANT-NO REFLEX-BLD
HIV 1 RNA Quant: <20 copies/mL
HIV-1 RNA Quant, Log: <1.3 Log copies/mL

## 2019-04-11 LAB — RPR: RPR Ser Ql: NONREACTIVE

## 2019-04-12 ENCOUNTER — Ambulatory Visit: Payer: Self-pay | Admitting: Student

## 2019-04-12 ENCOUNTER — Encounter: Payer: Self-pay | Admitting: Student

## 2019-04-13 ENCOUNTER — Telehealth: Payer: Self-pay | Admitting: *Deleted

## 2019-04-13 NOTE — Telephone Encounter (Signed)
RN reached out to patient at Dr Algis Downs request regarding her missed visit at [facility] yesterday. Per patient, her daughter was running a fever and she could not go to the appointment. Courtney Chavez has the clinic's phone number, will call tomorrow to reschedule the appointment.  She is very invested in keeping this appointment for birth control, as she does not want to become pregnant again.  Landis Gandy, RN

## 2019-04-14 NOTE — Telephone Encounter (Signed)
Thank you :)

## 2019-04-18 NOTE — Addendum Note (Signed)
Addended by: Landis Gandy on: 04/18/2019 09:32 AM   Modules accepted: Orders

## 2019-05-04 ENCOUNTER — Ambulatory Visit: Payer: Self-pay

## 2019-05-31 ENCOUNTER — Encounter: Payer: Self-pay | Admitting: Nurse Practitioner

## 2019-05-31 ENCOUNTER — Other Ambulatory Visit: Payer: Self-pay

## 2019-05-31 ENCOUNTER — Ambulatory Visit (INDEPENDENT_AMBULATORY_CARE_PROVIDER_SITE_OTHER): Payer: Self-pay | Admitting: Nurse Practitioner

## 2019-05-31 VITALS — BP 168/102 | HR 73 | Wt 259.1 lb

## 2019-05-31 DIAGNOSIS — Z3009 Encounter for other general counseling and advice on contraception: Secondary | ICD-10-CM

## 2019-05-31 DIAGNOSIS — I10 Essential (primary) hypertension: Secondary | ICD-10-CM

## 2019-05-31 NOTE — Progress Notes (Signed)
GYNECOLOGY OFFICE VISIT NOTE   History:  44 y.o. [id number] here today for consult for BTL.  She sees Dr. Johnnye Sima at Infectious Disease clinic.  She does not want additional children and was thinking she would get a tubal. Last vaginal birth was 09-02-18.  Her current method of contraception is abstinence.  She does not have insurance.  She denies any abnormal vaginal discharge, bleeding, pelvic pain or other concerns.   Past Medical History:  Diagnosis Date  . Gestational diabetes    metformin  . HIV (human immunodeficiency virus infection) ([place])   . HIV infection ([place])   . Hypertension     Past Surgical History:  Procedure Laterality Date  . NO PAST SURGERIES      The following portions of the patient's history were reviewed and updated as appropriate: allergies, current medications, past family history, past medical history, past social history, past surgical history and problem list.   Health Maintenance:  Normal pap on 02-04-17. Has not had a mammogram..   Review of Systems:  Pertinent items noted in HPI and remainder of comprehensive ROS otherwise negative.  Objective:  Physical Exam BP (!) 168/102 (BP Location: Left Arm)   Pulse 73   Wt 259 lb 1.6 oz (117.5 kg)   LMP 05/09/2019 (Approximate)   BMI 41.82 kg/m  CONSTITUTIONAL: Well-developed, well-nourished female in no acute distress.  HENT:  Normocephalic, atraumatic. External right and left ear normal.  EYES: Conjunctivae and EOM are normal. Pupils are equal, round.  No scleral icterus.  NECK: Normal range of motion, supple, no masses SKIN: No rash noted. Not diaphoretic. No erythema. No pallor. NEUROLOGIC: Alert and oriented to person, place, and time. Normal muscle tone coordination. No cranial nerve deficit noted. PSYCHIATRIC: Normal mood and affect. Normal behavior. Normal judgment and thought content. CARDIOVASCULAR: Normal heart rate noted RESPIRATORY: Effort and breath sounds normal, no problems with  respiration noted MUSCULOSKELETAL: Normal range of motion. No edema noted.  Labs and Imaging No results found.  Assessment & Plan:  1.  Family Planning Counseling Financial assistance application completed. Discussed options for long acting contraception. Client has used Mirena IUD in the pas and was receptive to using Mirena again.  Will plan for her to bring 3 items of income verification and fill out paperwork for Patient Assistance from [place] for free IUD.  Client in agreement.  Plans to continue abstinence for now or to use condoms for intercourse. Discussed her blood pressure today.  Has not taken BP medication in 2 days as she was not able to get to her pharmacy due to car needing repairs.  She can go today and get her meds at the pharamcy.  Dr. Johnnye Sima changed her BP meds recently and she was going to have to pay for the new medication.  All of her other meds are free to her so she is planning to call Dr. Johnnye Sima to see if the new BP medication can be covered by the same program as her other medications.  Plans to take BP medication today and will check her BP with her home cuff later today.  If her pressure continues to be elevated, she will need to notify her doctor. Currently has undetectable levels of HIV.    Routine preventative health maintenance measures emphasized. Please refer to After Visit Summary for other counseling recommendations.   Return in about 1 week (around 06/07/2019) for RN visit to fill out paperwork for free Mirena - will bring 3 sources of income.  Total face-to-face time with patient: 10 minutes.  Over 50% of encounter was spent on counseling and coordination of care.  Earlie Server, RN, MSN, NP-BC Nurse Practitioner, [facility] for [employer], [place] [facility] 05/31/2019 9:23 AM

## 2019-06-07 ENCOUNTER — Telehealth: Payer: Self-pay

## 2019-06-07 ENCOUNTER — Ambulatory Visit (INDEPENDENT_AMBULATORY_CARE_PROVIDER_SITE_OTHER): Payer: Self-pay | Admitting: *Deleted

## 2019-06-07 ENCOUNTER — Encounter: Payer: Self-pay | Admitting: *Deleted

## 2019-06-07 ENCOUNTER — Other Ambulatory Visit: Payer: Self-pay

## 2019-06-07 VITALS — BP 142/104 | HR 67 | Temp 97.8°F | Ht 62.0 in

## 2019-06-07 DIAGNOSIS — Z3009 Encounter for other general counseling and advice on contraception: Secondary | ICD-10-CM

## 2019-06-07 NOTE — Telephone Encounter (Signed)
Patient called triage stating that the blood pressure medication she was prescribed by Dr. Johnnye Sima is not covered under ADAP and is $40. She states she cannot afford that and is requesting an alternative. She said she is taking the blood pressure medication she was on while she was pregnant in the meantime and it isn't working. RN informed her that she would forward to provider and get back to her this afternoon.  Jaion Lagrange Lorita Officer, RN

## 2019-06-09 ENCOUNTER — Other Ambulatory Visit: Payer: Self-pay | Admitting: Infectious Diseases

## 2019-06-09 DIAGNOSIS — I10 Essential (primary) hypertension: Secondary | ICD-10-CM

## 2019-06-09 MED ORDER — HYDROCHLOROTHIAZIDE 25 MG PO TABS: 12.5000 mg | ORAL_TABLET | Freq: Every day | ORAL | 11 refills | Status: DC

## 2019-06-09 MED ORDER — LISINOPRIL 20 MG PO TABS: 20.0000 mg | ORAL_TABLET | Freq: Every day | ORAL | 11 refills | Status: DC

## 2019-07-01 NOTE — Progress Notes (Signed)
Late entry -  Pt was seen on 4/20 as scheduled for nurse visit. During her video visit on 4/13 she was requested to bring in 3 forms of income in order to complete application for free Mirena. She only brought in a copy of her letter from [place] stating the amount of her monthly [employer] Services (FNS).  Upon further questioning, pt stated that she lives alone with her children and she does not work. I asked how her bills are paid and she stated that the father of her children pays the rent and the grandparents pay her other bills. Pt was advised that we will need letters from the father and grandparents stating the bills that they pay regularly and the amounts of the bills. Once the letters have been received, she will be able to complete the application.  Pt voiced understanding.

## 2019-07-20 ENCOUNTER — Telehealth: Payer: Self-pay | Admitting: *Deleted

## 2019-07-20 NOTE — Telephone Encounter (Signed)
Patient called with concerns of missing her periods, states she has no symptoms of pregnancy.  Per chart, she is scheduled with gynecology on 6/7 at 2:15 for an IUD insert. She was not aware of the appointment, will follow up with them.  Landis Gandy, RN

## 2019-07-21 ENCOUNTER — Other Ambulatory Visit: Payer: Self-pay

## 2019-07-21 ENCOUNTER — Encounter: Payer: Self-pay | Admitting: Family Medicine

## 2019-07-21 ENCOUNTER — Ambulatory Visit (INDEPENDENT_AMBULATORY_CARE_PROVIDER_SITE_OTHER): Payer: Self-pay | Admitting: Family Medicine

## 2019-07-21 VITALS — BP 129/90 | HR 89 | Wt 251.5 lb

## 2019-07-21 DIAGNOSIS — Z3009 Encounter for other general counseling and advice on contraception: Secondary | ICD-10-CM

## 2019-07-21 DIAGNOSIS — Z0189 Encounter for other specified special examinations: Secondary | ICD-10-CM

## 2019-07-21 NOTE — Progress Notes (Signed)
Pt here today for IUD insertion. Pt does not have any insurance coverage. Per chart review pt was in process of completing application for Mirena IUD. Pt brought proof of income in the form of a letter from the individual who contributes to her rent with her to visit today. I explained to pt she will need to complete the application. Explained our office will contact her when we receive the Mirena IUD and she will be able to schedule an appt. Encouraged pt to also complete [facility] financial application. Both applications given to patient and patient states she will return on Monday to bring the completed applications. Discussed applying for [facility] as pt was recently approved for Pregnancy Medicaid. Phone number given to pt to contact Medicaid to begin process. Pt encouraged to use condoms until IUD insertion and to be sure to avoid any unprotected intercourse 2 weeks prior to insertion appt.   Apolonio Schneiders RN 07/21/19

## 2019-07-22 NOTE — Progress Notes (Signed)
Patient seen and assessed by nursing staff.  Agree with documentation and plan.  

## 2019-07-25 ENCOUNTER — Ambulatory Visit: Payer: Self-pay | Admitting: Nurse Practitioner

## 2019-08-16 ENCOUNTER — Telehealth: Payer: Self-pay | Admitting: *Deleted

## 2019-08-16 NOTE — Telephone Encounter (Signed)
Was preparing to complete provider section of free IUD application and found patient has not yet provided her proof of financial support from the Father of her children and grandparents as she was requested to do.   I called Rahaf and informed her I was following up on if she was still planning to apply for the free IUD. She states she is. I informed her we do not have her letters of proof of financial support and cannot send that in until we have them. She states she thought she left them but that her situation has changed. She states she is now working. I asked if she has insurance. She states she does not have insurance. I informed her she will need to complete a new application that states her income and provider proof as requested per the application but including a copy of her pay stub. She voices understanding. Otie Headlee,RN

## 2019-09-01 ENCOUNTER — Ambulatory Visit
Admission: RE | Admit: 2019-09-01 | Discharge: 2019-09-01 | Disposition: A | Discharge status: Home / Self Care | Payer: No Typology Code available for payment source | Source: Ambulatory Visit | Attending: Obstetrics and Gynecology | Admitting: Obstetrics and Gynecology

## 2019-09-01 ENCOUNTER — Other Ambulatory Visit: Payer: Self-pay | Admitting: Obstetrics and Gynecology

## 2019-09-01 DIAGNOSIS — R7611 Nonspecific reaction to tuberculin skin test without active tuberculosis: Secondary | ICD-10-CM

## 2019-09-20 ENCOUNTER — Other Ambulatory Visit: Payer: Self-pay

## 2019-09-20 DIAGNOSIS — Z79899 Other long term (current) drug therapy: Secondary | ICD-10-CM

## 2019-09-20 DIAGNOSIS — B20 Human immunodeficiency virus [HIV] disease: Secondary | ICD-10-CM

## 2019-09-20 DIAGNOSIS — Z6841 Body Mass Index (BMI) 40.0 and over, adult: Secondary | ICD-10-CM

## 2019-09-21 LAB — T-HELPER CELL (CD4) - (RCID CLINIC ONLY)
CD4 % Helper T Cell: 37 % (ref 33–65)
CD4 T Cell Abs: 687 /uL (ref 400–1790)

## 2019-09-23 LAB — COMPREHENSIVE METABOLIC PANEL
AG Ratio: 1.3 (calc) (ref 1.0–2.5)
ALT: 12 U/L (ref 6–29)
AST: 14 U/L (ref 10–30)
Albumin: 3.8 g/dL (ref 3.6–5.1)
Alkaline phosphatase (APISO): 41 U/L (ref 31–125)
BUN: 8 mg/dL (ref 7–25)
CO2: 29 mmol/L (ref 20–32)
Calcium: 8.6 mg/dL (ref 8.6–10.2)
Chloride: 103 mmol/L (ref 98–110)
Creat: 0.65 mg/dL (ref 0.50–1.10)
Globulin: 3 g/dL (calc) (ref 1.9–3.7)
Glucose, Bld: 107 mg/dL — ABNORMAL HIGH (ref 65–99)
Potassium: 3.8 mmol/L (ref 3.5–5.3)
Sodium: 137 mmol/L (ref 135–146)
Total Bilirubin: 0.4 mg/dL (ref 0.2–1.2)
Total Protein: 6.8 g/dL (ref 6.1–8.1)

## 2019-09-23 LAB — CBC
HCT: 37.3 % (ref 35.0–45.0)
Hemoglobin: 12.3 g/dL (ref 11.7–15.5)
MCH: 29.9 pg (ref 27.0–33.0)
MCHC: 33 g/dL (ref 32.0–36.0)
MCV: 90.5 fL (ref 80.0–100.0)
MPV: 11.6 fL (ref 7.5–12.5)
Platelets: 208 10*3/uL (ref 140–400)
RBC: 4.12 10*6/uL (ref 3.80–5.10)
RDW: 12.9 % (ref 11.0–15.0)
WBC: 5.8 10*3/uL (ref 3.8–10.8)

## 2019-09-23 LAB — LIPID PANEL
Cholesterol: 199 mg/dL (ref <200)
HDL: 48 mg/dL — ABNORMAL LOW (ref >=50)
LDL Cholesterol (Calc): 132 mg/dL (calc) — ABNORMAL HIGH
Non-HDL Cholesterol (Calc): 151 mg/dL (calc) — ABNORMAL HIGH (ref <130)
Total CHOL/HDL Ratio: 4.1 (calc) (ref <5.0)
Triglycerides: 90 mg/dL (ref <150)

## 2019-09-23 LAB — HIV-1 RNA QUANT-NO REFLEX-BLD
HIV 1 RNA Quant: <20 Copies/mL
HIV-1 RNA Quant, Log: <1.3 Log cps/mL

## 2019-10-04 ENCOUNTER — Encounter: Payer: Self-pay | Admitting: Infectious Diseases

## 2019-10-12 ENCOUNTER — Telehealth: Payer: Self-pay

## 2019-10-12 NOTE — Telephone Encounter (Signed)
COVID-19 Pre-Screening Questions:10/12/19  Do you currently have a fever (>100 F), chills or unexplained body aches?NO  Are you currently experiencing new cough, shortness of breath, sore throat, runny nose? NO .  Have you been in contact with someone that is currently pending confirmation of Covid19 testing or has been confirmed to have the Covid19 virus?  *NO  **If the patient answers NO to ALL questions -  advise the patient to please call the clinic before coming to the office should any symptoms develop.     

## 2019-10-13 ENCOUNTER — Encounter: Payer: Self-pay | Admitting: Infectious Diseases

## 2019-10-13 ENCOUNTER — Other Ambulatory Visit: Payer: Self-pay | Admitting: Family Medicine

## 2019-10-13 ENCOUNTER — Ambulatory Visit (INDEPENDENT_AMBULATORY_CARE_PROVIDER_SITE_OTHER): Payer: Self-pay | Admitting: Infectious Diseases

## 2019-10-13 ENCOUNTER — Other Ambulatory Visit: Payer: Self-pay

## 2019-10-13 DIAGNOSIS — B20 Human immunodeficiency virus [HIV] disease: Secondary | ICD-10-CM

## 2019-10-13 DIAGNOSIS — I10 Essential (primary) hypertension: Secondary | ICD-10-CM

## 2019-10-13 DIAGNOSIS — Z6841 Body Mass Index (BMI) 40.0 and over, adult: Secondary | ICD-10-CM

## 2019-10-13 DIAGNOSIS — Z3009 Encounter for other general counseling and advice on contraception: Secondary | ICD-10-CM

## 2019-10-13 NOTE — Progress Notes (Signed)
   Subjective:    Patient ID: Courtney Chavez, female    DOB: 1975/09/03, 44 y.o.   MRN: [id number]  HPI 44yo F from [place] came to [place] 2001. HIV+ and HTN.Also hx ofEFV/NVP resistance. Was changed to complerathen to triumeq due to wt gain.  Has 3sons(09-28-12).They are doing well. She was on Isentress- truvada while pregnant. She was then changed back to triumeq after delivering. She has 66 yo daughter now. Tests (-). She has had f/u at [facility]. She is doing well.   She is better controlled on her BP meds (lisinopril-hctz) today.  She is concerned about wt gain. Not exercising. Has been watching diet irregularly.   Has gotten COVID vax.  C/o myalgias since birth of daughter.    HIV 1 RNA Quant  Date Value  09/20/2019 <20 Copies/mL  04/06/2019 <20 NOT DETECTED copies/mL  08/30/2018 <20 copies/mL   CD4 T Cell Abs (/uL)  Date Value  09/20/2019 687  06/11/2018 530  03/11/2018 720    Review of Systems  Constitutional: Negative for appetite change, chills, fever and unexpected weight change.  Respiratory: Negative for cough and shortness of breath.   Gastrointestinal: Negative for constipation and diarrhea.  Genitourinary: Negative for difficulty urinating.  Musculoskeletal: Positive for myalgias.  Please see HPI. All other systems reviewed and negative.      Objective:   Physical Exam Vitals reviewed.  Constitutional:      Appearance: Normal appearance.  HENT:     Mouth/Throat:     Mouth: Mucous membranes are moist.     Pharynx: No oropharyngeal exudate.  Eyes:     Extraocular Movements: Extraocular movements intact.     Pupils: Pupils are equal, round, and reactive to light.  Cardiovascular:     Rate and Rhythm: Normal rate and regular rhythm.  Pulmonary:     Effort: Pulmonary effort is normal.     Breath sounds: Normal breath sounds.  Abdominal:     General: Bowel sounds are normal. There is no distension.     Palpations: Abdomen is soft.     Tenderness:  There is no abdominal tenderness.  Musculoskeletal:     Cervical back: Normal range of motion and neck supple.     Right lower leg: No edema.     Left lower leg: No edema.  Neurological:     Mental Status: She is alert.  Psychiatric:        Mood and Affect: Mood normal.           Assessment & Plan:

## 2019-10-13 NOTE — Assessment & Plan Note (Signed)
Better today.  Will continue to watch.

## 2019-10-13 NOTE — Assessment & Plan Note (Signed)
She is doing well Offered/refused condoms.  No plan to change art.  Will see her back in 9 months.  She needs PCV 23 (was in rush to leave today).

## 2019-10-13 NOTE — Assessment & Plan Note (Signed)
Encouraged her to  1) diet 2) exercise 3) drink more water She contracts to lose 35# by next visit.

## 2019-10-13 NOTE — Assessment & Plan Note (Signed)
She has some frustration with her efforts in this  Area.  Was not able to get IUD or tubal.  Offered/refused condoms.

## 2019-11-28 ENCOUNTER — Telehealth: Payer: Self-pay

## 2019-11-28 NOTE — Telephone Encounter (Signed)
[facility] Patient Advocate Encounter   Was successful in obtaining a Viiv copay card for [facility].  This copay card will make the patients copay $0.00.  This is for 3-30 day fills until ADAP is approved.    RxBin: [id number] PCN: [id number] Member ID: [id number] Group ID: [id number]  Ileene Patrick, CPhT Specialty Pharmacy Patient [facility] for Infectious Disease Phone: [phone] Fax:  [phone]

## 2019-11-29 ENCOUNTER — Telehealth: Payer: Self-pay | Admitting: *Deleted

## 2019-11-29 NOTE — Telephone Encounter (Signed)
Patient asked for refills of aspirin 81 mg and ibuprofen 600 mg.  These were prescribed by a different provider more than 1 year ago. She states she has to pay for these out of pocket anyway, wanted to know what dose it was. She will purchase the aspirin 81 mg. Landis Gandy, RN

## 2019-12-02 ENCOUNTER — Encounter: Payer: Self-pay | Admitting: Infectious Diseases

## 2019-12-27 ENCOUNTER — Encounter: Payer: Self-pay | Admitting: *Deleted

## 2020-01-12 ENCOUNTER — Other Ambulatory Visit: Payer: Self-pay

## 2020-01-12 ENCOUNTER — Emergency Department (HOSPITAL_COMMUNITY): Payer: 59

## 2020-01-12 ENCOUNTER — Emergency Department (HOSPITAL_COMMUNITY)
Admission: EM | Admit: 2020-01-12 | Discharge: 2020-01-12 | Disposition: A | Discharge status: Home / Self Care | Payer: 59 | Attending: Emergency Medicine | Admitting: Emergency Medicine

## 2020-01-12 DIAGNOSIS — Z79899 Other long term (current) drug therapy: Secondary | ICD-10-CM | POA: Insufficient documentation

## 2020-01-12 DIAGNOSIS — Y92410 Unspecified street and highway as the place of occurrence of the external cause: Secondary | ICD-10-CM | POA: Insufficient documentation

## 2020-01-12 DIAGNOSIS — Z7982 Long term (current) use of aspirin: Secondary | ICD-10-CM | POA: Diagnosis not present

## 2020-01-12 DIAGNOSIS — I10 Essential (primary) hypertension: Secondary | ICD-10-CM | POA: Diagnosis not present

## 2020-01-12 DIAGNOSIS — S0083XA Contusion of other part of head, initial encounter: Secondary | ICD-10-CM | POA: Diagnosis not present

## 2020-01-12 DIAGNOSIS — S0993XA Unspecified injury of face, initial encounter: Secondary | ICD-10-CM | POA: Diagnosis present

## 2020-01-12 LAB — URINALYSIS, ROUTINE W REFLEX MICROSCOPIC
Bilirubin Urine: NEGATIVE
Glucose, UA: NEGATIVE mg/dL
Ketones, ur: NEGATIVE mg/dL
Nitrite: NEGATIVE
Protein, ur: 30 mg/dL — AB
Specific Gravity, Urine: 1.011 (ref 1.005–1.030)
pH: 6 (ref 5.0–8.0)

## 2020-01-12 LAB — COMPREHENSIVE METABOLIC PANEL WITH GFR
ALT: 20 U/L (ref 0–44)
AST: 20 U/L (ref 15–41)
Albumin: 4 g/dL (ref 3.5–5.0)
Alkaline Phosphatase: 43 U/L (ref 38–126)
Anion gap: 11 (ref 5–15)
BUN: 11 mg/dL (ref 6–20)
CO2: 26 mmol/L (ref 22–32)
Calcium: 9.5 mg/dL (ref 8.9–10.3)
Chloride: 103 mmol/L (ref 98–111)
Creatinine, Ser: 0.86 mg/dL (ref 0.44–1.00)
GFR, Estimated: >60 mL/min
Glucose, Bld: 132 mg/dL — ABNORMAL HIGH (ref 70–99)
Potassium: 3.6 mmol/L (ref 3.5–5.1)
Sodium: 140 mmol/L (ref 135–145)
Total Bilirubin: 0.4 mg/dL (ref 0.3–1.2)
Total Protein: 7.5 g/dL (ref 6.5–8.1)

## 2020-01-12 LAB — CBC
HCT: 39.8 % (ref 36.0–46.0)
Hemoglobin: 12.6 g/dL (ref 12.0–15.0)
MCH: 29.4 pg (ref 26.0–34.0)
MCHC: 31.7 g/dL (ref 30.0–36.0)
MCV: 93 fL (ref 80.0–100.0)
Platelets: 192 K/uL (ref 150–400)
RBC: 4.28 MIL/uL (ref 3.87–5.11)
RDW: 13.2 % (ref 11.5–15.5)
WBC: 6.4 K/uL (ref 4.0–10.5)
nRBC: 0 % (ref 0.0–0.2)

## 2020-01-12 LAB — LIPASE, BLOOD: Lipase: 32 U/L (ref 11–51)

## 2020-01-12 LAB — I-STAT BETA HCG BLOOD, ED (MC, WL, AP ONLY): I-stat hCG, quantitative: <5 m[IU]/mL

## 2020-01-12 MED ORDER — OXYCODONE-ACETAMINOPHEN 5-325 MG PO TABS
1.0000 | ORAL_TABLET | Freq: Once | ORAL | Status: AC
  Administered 2020-01-12: 1 via ORAL
  Filled 2020-01-12: qty 1
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
## 2020-01-12 NOTE — ED Notes (Signed)
Pt to CT
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
## 2020-01-12 NOTE — Discharge Instructions (Addendum)
The hematoma on the face will likely slowly track down the face.  You can apply ice to help with the swelling.  No significant injuries were found on imaging.  If symptoms worsen or change seek reassessment.  Tylenol Motrin for pain control at home.

## 2020-01-12 NOTE — ED Notes (Signed)
Pt ambulates to bathroom without difficulty; PO challenge given.

## 2020-01-17 ENCOUNTER — Ambulatory Visit (HOSPITAL_COMMUNITY)
Admission: EM | Admit: 2020-01-17 | Discharge: 2020-01-17 | Disposition: A | Discharge status: Home / Self Care | Payer: 59 | Attending: Family Medicine | Admitting: Family Medicine

## 2020-01-17 ENCOUNTER — Ambulatory Visit (INDEPENDENT_AMBULATORY_CARE_PROVIDER_SITE_OTHER): Payer: 59

## 2020-01-17 ENCOUNTER — Other Ambulatory Visit: Payer: Self-pay

## 2020-01-17 ENCOUNTER — Encounter (HOSPITAL_COMMUNITY): Payer: Self-pay

## 2020-01-17 DIAGNOSIS — M79641 Pain in right hand: Secondary | ICD-10-CM

## 2020-01-17 DIAGNOSIS — M79644 Pain in right finger(s): Secondary | ICD-10-CM | POA: Diagnosis not present

## 2020-01-17 DIAGNOSIS — S161XXA Strain of muscle, fascia and tendon at neck level, initial encounter: Secondary | ICD-10-CM

## 2020-01-17 DIAGNOSIS — S39012A Strain of muscle, fascia and tendon of lower back, initial encounter: Secondary | ICD-10-CM | POA: Diagnosis not present

## 2020-01-17 MED ORDER — NAPROXEN 500 MG PO TABS
500.0000 mg | ORAL_TABLET | Freq: Two times a day (BID) | ORAL | 0 refills | Status: DC | PRN
Start: 2020-01-17 — End: 2021-01-17

## 2020-01-17 MED ORDER — CYCLOBENZAPRINE HCL 10 MG PO TABS
10.0000 mg | ORAL_TABLET | Freq: Three times a day (TID) | ORAL | 0 refills | Status: DC | PRN
Start: 2020-01-17 — End: 2020-01-30
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
## 2020-01-24 ENCOUNTER — Ambulatory Visit (INDEPENDENT_AMBULATORY_CARE_PROVIDER_SITE_OTHER): Payer: 59 | Admitting: Family Medicine

## 2020-01-24 ENCOUNTER — Other Ambulatory Visit: Payer: Self-pay

## 2020-01-24 ENCOUNTER — Encounter: Payer: Self-pay | Admitting: Family Medicine

## 2020-01-24 ENCOUNTER — Encounter: Payer: Self-pay | Admitting: General Practice

## 2020-01-24 VITALS — BP 130/80 | HR 75 | Temp 98.8°F | Resp 16 | Wt 276.6 lb

## 2020-01-24 DIAGNOSIS — T148XXA Other injury of unspecified body region, initial encounter: Secondary | ICD-10-CM

## 2020-01-24 DIAGNOSIS — M545 Low back pain, unspecified: Secondary | ICD-10-CM

## 2020-01-24 DIAGNOSIS — I10 Essential (primary) hypertension: Secondary | ICD-10-CM

## 2020-01-24 DIAGNOSIS — R519 Headache, unspecified: Secondary | ICD-10-CM | POA: Diagnosis not present

## 2020-01-24 DIAGNOSIS — M25562 Pain in left knee: Secondary | ICD-10-CM

## 2020-01-24 DIAGNOSIS — S0990XA Unspecified injury of head, initial encounter: Secondary | ICD-10-CM | POA: Diagnosis not present

## 2020-01-24 DIAGNOSIS — M25561 Pain in right knee: Secondary | ICD-10-CM

## 2020-01-24 MED ORDER — DICLOFENAC SODIUM 1 % EX GEL: 4.0000 g | Freq: Four times a day (QID) | CUTANEOUS | 0 refills | Status: DC

## 2020-01-24 NOTE — Patient Instructions (Addendum)
Continue taking naproxen twice daily.  You may also take Tylenol if needed 1000 mg twice daily.  Take the Flexeril at bedtime as needed as you have been.  I am prescribing Volaren gel for you to use for knee pain.   Use heat or ice on the areas that are painful.  Go to [facility] imaging to get the x-ray of your low back.  I am referring you to an orthopedist and they will call you to schedule.
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
## 2020-01-30 ENCOUNTER — Encounter: Payer: Self-pay | Admitting: Family Medicine

## 2020-01-30 ENCOUNTER — Ambulatory Visit
Admission: RE | Admit: 2020-01-30 | Discharge: 2020-01-30 | Disposition: A | Discharge status: Home / Self Care | Payer: 59 | Source: Ambulatory Visit | Attending: Family Medicine | Admitting: Family Medicine

## 2020-01-30 ENCOUNTER — Other Ambulatory Visit: Payer: Self-pay

## 2020-01-30 ENCOUNTER — Ambulatory Visit (INDEPENDENT_AMBULATORY_CARE_PROVIDER_SITE_OTHER): Payer: 59 | Admitting: Family Medicine

## 2020-01-30 DIAGNOSIS — M25561 Pain in right knee: Secondary | ICD-10-CM | POA: Diagnosis not present

## 2020-01-30 DIAGNOSIS — M545 Low back pain, unspecified: Secondary | ICD-10-CM

## 2020-01-30 DIAGNOSIS — M542 Cervicalgia: Secondary | ICD-10-CM | POA: Diagnosis not present

## 2020-01-30 DIAGNOSIS — M79644 Pain in right finger(s): Secondary | ICD-10-CM

## 2020-01-30 DIAGNOSIS — M25562 Pain in left knee: Secondary | ICD-10-CM

## 2020-01-30 MED ORDER — BACLOFEN 10 MG PO TABS: 5.0000 mg | ORAL_TABLET | Freq: Three times a day (TID) | ORAL | 3 refills | Status: DC | PRN
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
## 2020-02-03 ENCOUNTER — Ambulatory Visit: Payer: 59 | Admitting: Family Medicine

## 2020-02-05 NOTE — Progress Notes (Signed)
She did not review her mychart message. Please make sure she knows the results. Thanks.

## 2020-02-06 ENCOUNTER — Ambulatory Visit (INDEPENDENT_AMBULATORY_CARE_PROVIDER_SITE_OTHER): Payer: 59 | Admitting: Family Medicine

## 2020-02-06 ENCOUNTER — Other Ambulatory Visit: Payer: Self-pay

## 2020-02-06 DIAGNOSIS — M542 Cervicalgia: Secondary | ICD-10-CM | POA: Diagnosis not present

## 2020-02-06 DIAGNOSIS — M79644 Pain in right finger(s): Secondary | ICD-10-CM | POA: Diagnosis not present

## 2020-02-06 DIAGNOSIS — M25562 Pain in left knee: Secondary | ICD-10-CM

## 2020-02-06 DIAGNOSIS — M25561 Pain in right knee: Secondary | ICD-10-CM

## 2020-02-06 DIAGNOSIS — M545 Low back pain, unspecified: Secondary | ICD-10-CM | POA: Diagnosis not present

## 2020-02-06 MED ORDER — METHOCARBAMOL 750 MG PO TABS
750.0000 mg | ORAL_TABLET | Freq: Four times a day (QID) | ORAL | 1 refills | Status: DC | PRN
Start: 2020-02-06 — End: 2021-01-17

## 2020-02-06 MED ORDER — TRAMADOL HCL 50 MG PO TABS: 50.0000 mg | ORAL_TABLET | Freq: Four times a day (QID) | ORAL | 0 refills | Status: DC | PRN
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
## 2020-02-15 ENCOUNTER — Other Ambulatory Visit: Payer: Self-pay

## 2020-02-15 ENCOUNTER — Encounter: Payer: Self-pay | Admitting: Physical Therapy

## 2020-02-15 ENCOUNTER — Ambulatory Visit (INDEPENDENT_AMBULATORY_CARE_PROVIDER_SITE_OTHER): Payer: 59 | Admitting: Physical Therapy

## 2020-02-15 DIAGNOSIS — M25562 Pain in left knee: Secondary | ICD-10-CM

## 2020-02-15 DIAGNOSIS — M545 Low back pain, unspecified: Secondary | ICD-10-CM

## 2020-02-15 DIAGNOSIS — M542 Cervicalgia: Secondary | ICD-10-CM

## 2020-02-15 DIAGNOSIS — M25561 Pain in right knee: Secondary | ICD-10-CM | POA: Diagnosis not present

## 2020-02-15 DIAGNOSIS — M6281 Muscle weakness (generalized): Secondary | ICD-10-CM | POA: Diagnosis not present

## 2020-02-15 NOTE — Patient Instructions (Signed)
Access Code: [email or web address] URL: [email or web address] Date: 02/15/2020 Prepared by: Ivery Quale  Exercises Seated Passive Cervical Retraction - 2 x daily - 6 x weekly - 2-3 sets - 10 reps Seated Assisted Cervical Rotation with Towel - 2 x daily - 6 x weekly - 1 sets - 10 reps - 5 hold Standing Scapular Retraction - 2 x daily - 6 x weekly - 2-3 sets - 10 reps Supine Lower Trunk Rotation - 2 x daily - 6 x weekly - 1 sets - 10 reps - 5 sec hold Seated Long Arc Quad - 2 x daily - 6 x weekly - 2-3 sets - 10 reps Mini Squat with Counter Support - 2 x daily - 6 x weekly - 1-2 sets - 10 reps  Patient Education TENS Therapy
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
## 2020-02-29 ENCOUNTER — Other Ambulatory Visit: Payer: Self-pay

## 2020-02-29 ENCOUNTER — Ambulatory Visit (INDEPENDENT_AMBULATORY_CARE_PROVIDER_SITE_OTHER): Payer: 59 | Admitting: Physical Therapy

## 2020-02-29 DIAGNOSIS — M6281 Muscle weakness (generalized): Secondary | ICD-10-CM

## 2020-02-29 DIAGNOSIS — M545 Low back pain, unspecified: Secondary | ICD-10-CM | POA: Diagnosis not present

## 2020-02-29 DIAGNOSIS — M542 Cervicalgia: Secondary | ICD-10-CM | POA: Diagnosis not present

## 2020-02-29 DIAGNOSIS — M25562 Pain in left knee: Secondary | ICD-10-CM

## 2020-02-29 DIAGNOSIS — M25561 Pain in right knee: Secondary | ICD-10-CM
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
## 2020-03-07 ENCOUNTER — Other Ambulatory Visit: Payer: Self-pay

## 2020-03-07 ENCOUNTER — Ambulatory Visit (INDEPENDENT_AMBULATORY_CARE_PROVIDER_SITE_OTHER): Payer: 59 | Admitting: Physical Therapy

## 2020-03-07 ENCOUNTER — Encounter: Payer: Self-pay | Admitting: Physical Therapy

## 2020-03-07 DIAGNOSIS — M25561 Pain in right knee: Secondary | ICD-10-CM

## 2020-03-07 DIAGNOSIS — M25562 Pain in left knee: Secondary | ICD-10-CM

## 2020-03-07 DIAGNOSIS — M6281 Muscle weakness (generalized): Secondary | ICD-10-CM | POA: Diagnosis not present

## 2020-03-07 DIAGNOSIS — M542 Cervicalgia: Secondary | ICD-10-CM

## 2020-03-07 DIAGNOSIS — M545 Low back pain, unspecified: Secondary | ICD-10-CM | POA: Diagnosis not present
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
## 2020-03-12 ENCOUNTER — Ambulatory Visit: Payer: 59 | Admitting: Family Medicine

## 2020-03-14 ENCOUNTER — Ambulatory Visit (INDEPENDENT_AMBULATORY_CARE_PROVIDER_SITE_OTHER): Payer: 59 | Admitting: Physical Therapy

## 2020-03-14 ENCOUNTER — Ambulatory Visit (INDEPENDENT_AMBULATORY_CARE_PROVIDER_SITE_OTHER): Payer: 59 | Admitting: Family Medicine

## 2020-03-14 ENCOUNTER — Encounter: Payer: Self-pay | Admitting: Family Medicine

## 2020-03-14 ENCOUNTER — Other Ambulatory Visit: Payer: Self-pay

## 2020-03-14 ENCOUNTER — Encounter: Payer: Self-pay | Admitting: Physical Therapy

## 2020-03-14 DIAGNOSIS — M6281 Muscle weakness (generalized): Secondary | ICD-10-CM

## 2020-03-14 DIAGNOSIS — M545 Low back pain, unspecified: Secondary | ICD-10-CM

## 2020-03-14 DIAGNOSIS — M25562 Pain in left knee: Secondary | ICD-10-CM | POA: Diagnosis not present

## 2020-03-14 DIAGNOSIS — M542 Cervicalgia: Secondary | ICD-10-CM | POA: Diagnosis not present

## 2020-03-14 DIAGNOSIS — M25561 Pain in right knee: Secondary | ICD-10-CM
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
## 2020-03-21 ENCOUNTER — Ambulatory Visit (INDEPENDENT_AMBULATORY_CARE_PROVIDER_SITE_OTHER): Payer: 59 | Admitting: Physical Therapy

## 2020-03-21 ENCOUNTER — Other Ambulatory Visit: Payer: Self-pay

## 2020-03-21 DIAGNOSIS — M25562 Pain in left knee: Secondary | ICD-10-CM | POA: Diagnosis not present

## 2020-03-21 DIAGNOSIS — M6281 Muscle weakness (generalized): Secondary | ICD-10-CM

## 2020-03-21 DIAGNOSIS — M545 Low back pain, unspecified: Secondary | ICD-10-CM

## 2020-03-21 DIAGNOSIS — M25561 Pain in right knee: Secondary | ICD-10-CM

## 2020-03-21 DIAGNOSIS — M542 Cervicalgia: Secondary | ICD-10-CM
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
## 2020-03-28 ENCOUNTER — Encounter: Payer: Self-pay | Admitting: Physical Therapy

## 2020-03-29 DIAGNOSIS — Z0279 Encounter for issue of other medical certificate: Secondary | ICD-10-CM

## 2020-04-04 ENCOUNTER — Other Ambulatory Visit: Payer: Self-pay

## 2020-04-04 ENCOUNTER — Ambulatory Visit (INDEPENDENT_AMBULATORY_CARE_PROVIDER_SITE_OTHER): Payer: 59 | Admitting: Physical Therapy

## 2020-04-04 DIAGNOSIS — M25561 Pain in right knee: Secondary | ICD-10-CM

## 2020-04-04 DIAGNOSIS — M25562 Pain in left knee: Secondary | ICD-10-CM | POA: Diagnosis not present

## 2020-04-04 DIAGNOSIS — M542 Cervicalgia: Secondary | ICD-10-CM

## 2020-04-04 DIAGNOSIS — M545 Low back pain, unspecified: Secondary | ICD-10-CM

## 2020-04-04 DIAGNOSIS — M6281 Muscle weakness (generalized): Secondary | ICD-10-CM | POA: Diagnosis not present
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
## 2020-04-11 ENCOUNTER — Encounter: Payer: Self-pay | Admitting: Physical Therapy

## 2020-04-25 ENCOUNTER — Other Ambulatory Visit: Payer: Self-pay

## 2020-04-25 ENCOUNTER — Encounter: Payer: Self-pay | Admitting: Physical Therapy

## 2020-04-25 ENCOUNTER — Ambulatory Visit (INDEPENDENT_AMBULATORY_CARE_PROVIDER_SITE_OTHER): Payer: 59 | Admitting: Physical Therapy

## 2020-04-25 DIAGNOSIS — M6281 Muscle weakness (generalized): Secondary | ICD-10-CM

## 2020-04-25 DIAGNOSIS — M545 Low back pain, unspecified: Secondary | ICD-10-CM | POA: Diagnosis not present

## 2020-04-25 DIAGNOSIS — M25562 Pain in left knee: Secondary | ICD-10-CM

## 2020-04-25 DIAGNOSIS — M542 Cervicalgia: Secondary | ICD-10-CM

## 2020-04-25 DIAGNOSIS — M25561 Pain in right knee: Secondary | ICD-10-CM | POA: Diagnosis not present
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
## 2020-04-26 ENCOUNTER — Telehealth: Payer: Self-pay

## 2020-04-26 NOTE — Telephone Encounter (Signed)
Called patient to get an appointment scheduled, left a voicemail for her to call back and get that scheduled

## 2020-05-08 ENCOUNTER — Encounter: Payer: Self-pay | Admitting: Family Medicine

## 2020-05-08 ENCOUNTER — Ambulatory Visit (INDEPENDENT_AMBULATORY_CARE_PROVIDER_SITE_OTHER): Payer: 59 | Admitting: Family Medicine

## 2020-05-08 ENCOUNTER — Other Ambulatory Visit: Payer: Self-pay

## 2020-05-08 DIAGNOSIS — M542 Cervicalgia: Secondary | ICD-10-CM

## 2020-05-08 DIAGNOSIS — M25562 Pain in left knee: Secondary | ICD-10-CM

## 2020-05-08 DIAGNOSIS — M79644 Pain in right finger(s): Secondary | ICD-10-CM

## 2020-05-08 DIAGNOSIS — M545 Low back pain, unspecified: Secondary | ICD-10-CM

## 2020-05-08 DIAGNOSIS — M25561 Pain in right knee: Secondary | ICD-10-CM | POA: Diagnosis not present
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
## 2020-05-14 ENCOUNTER — Ambulatory Visit: Payer: Self-pay | Admitting: Family Medicine

## 2020-05-26 ENCOUNTER — Ambulatory Visit
Admission: RE | Admit: 2020-05-26 | Discharge: 2020-05-26 | Disposition: A | Discharge status: Home / Self Care | Payer: 59 | Source: Ambulatory Visit | Attending: Family Medicine | Admitting: Family Medicine

## 2020-05-26 DIAGNOSIS — M25562 Pain in left knee: Secondary | ICD-10-CM

## 2020-05-28 ENCOUNTER — Telehealth: Payer: Self-pay | Admitting: Family Medicine

## 2020-05-28 NOTE — Telephone Encounter (Signed)
Sent this info in a MyChart message to the patient. Advised her to call to schedule the consult with Dr. Erlinda Hong, but also let us know if she has questions/concerns prior to doing that.

## 2020-05-28 NOTE — Telephone Encounter (Signed)
MRI shows a tear of the lateral meniscus cartilage.  I suspect this is the cause of the ongoing pain.  I'd like to schedule consultation with Dr. Erlinda Hong to discuss arthroscopic surgery.

## 2020-05-30 ENCOUNTER — Ambulatory Visit: Payer: 59 | Admitting: Orthopaedic Surgery

## 2020-06-05 ENCOUNTER — Ambulatory Visit (INDEPENDENT_AMBULATORY_CARE_PROVIDER_SITE_OTHER): Payer: 59 | Admitting: Orthopaedic Surgery

## 2020-06-05 ENCOUNTER — Encounter: Payer: Self-pay | Admitting: Orthopaedic Surgery

## 2020-06-05 ENCOUNTER — Other Ambulatory Visit: Payer: Self-pay

## 2020-06-05 VITALS — Ht 64.0 in | Wt 260.0 lb

## 2020-06-05 DIAGNOSIS — M25562 Pain in left knee: Secondary | ICD-10-CM | POA: Diagnosis not present

## 2020-06-05 DIAGNOSIS — S83282A Other tear of lateral meniscus, current injury, left knee, initial encounter: Secondary | ICD-10-CM
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
## 2020-06-28 ENCOUNTER — Other Ambulatory Visit (HOSPITAL_COMMUNITY)
Admission: RE | Admit: 2020-06-28 | Discharge: 2020-06-28 | Disposition: A | Discharge status: Home / Self Care | Payer: 59 | Source: Ambulatory Visit | Attending: Infectious Diseases | Admitting: Infectious Diseases

## 2020-06-28 ENCOUNTER — Other Ambulatory Visit: Payer: Self-pay

## 2020-06-28 ENCOUNTER — Ambulatory Visit (INDEPENDENT_AMBULATORY_CARE_PROVIDER_SITE_OTHER): Payer: 59 | Admitting: Infectious Diseases

## 2020-06-28 ENCOUNTER — Encounter: Payer: Self-pay | Admitting: Infectious Diseases

## 2020-06-28 VITALS — Temp 98.6°F | Wt 262.0 lb

## 2020-06-28 DIAGNOSIS — L918 Other hypertrophic disorders of the skin: Secondary | ICD-10-CM

## 2020-06-28 DIAGNOSIS — S83282D Other tear of lateral meniscus, current injury, left knee, subsequent encounter: Secondary | ICD-10-CM | POA: Diagnosis not present

## 2020-06-28 DIAGNOSIS — Z6841 Body Mass Index (BMI) 40.0 and over, adult: Secondary | ICD-10-CM | POA: Diagnosis not present

## 2020-06-28 DIAGNOSIS — Z113 Encounter for screening for infections with a predominantly sexual mode of transmission: Secondary | ICD-10-CM | POA: Diagnosis present

## 2020-06-28 DIAGNOSIS — Z79899 Other long term (current) drug therapy: Secondary | ICD-10-CM

## 2020-06-28 DIAGNOSIS — S83289A Other tear of lateral meniscus, current injury, unspecified knee, initial encounter: Secondary | ICD-10-CM | POA: Insufficient documentation

## 2020-06-28 DIAGNOSIS — B20 Human immunodeficiency virus [HIV] disease: Secondary | ICD-10-CM

## 2020-06-28 MED ORDER — ORLISTAT 120 MG PO CAPS: 120.0000 mg | ORAL_CAPSULE | Freq: Three times a day (TID) | ORAL | Status: DC

## 2020-06-28 NOTE — Assessment & Plan Note (Addendum)
Encouraged her diet Encouraged her to have knee surgery so she can resume exercise.  Refill orlistat

## 2020-06-28 NOTE — Assessment & Plan Note (Signed)
R ear Will send to plastics, she'd like removed.

## 2020-06-28 NOTE — Assessment & Plan Note (Signed)
She is doing well No change in ART (triumeq) Offered/refused condoms.  Labs today pcv 23 rtc in 9 months.   HIV 1 RNA Quant  Date Value  09/20/2019 <20 Copies/mL  04/06/2019 <20 NOT DETECTED copies/mL  08/30/2018 <20 copies/mL   CD4 T Cell Abs (/uL)  Date Value  09/20/2019 687  06/11/2018 530  03/11/2018 720
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
## 2020-06-28 NOTE — Addendum Note (Signed)
Addended by: Caffie Pinto on: 06/28/2020 05:11 PM   Modules accepted: Orders

## 2020-06-28 NOTE — Assessment & Plan Note (Signed)
Appreciate surgical f/u Await repair

## 2020-06-29 LAB — T-HELPER CELL (CD4) - (RCID CLINIC ONLY)
CD4 % Helper T Cell: 38 % (ref 33–65)
CD4 T Cell Abs: 889 /uL (ref 400–1790)

## 2020-07-01 LAB — URINE CYTOLOGY ANCILLARY ONLY
Chlamydia: NEGATIVE
Comment: NEGATIVE
Comment: NORMAL
Neisseria Gonorrhea: NEGATIVE

## 2020-07-02 LAB — CBC
HCT: 41.4 % (ref 35.0–45.0)
Hemoglobin: 13.7 g/dL (ref 11.7–15.5)
MCH: 29 pg (ref 27.0–33.0)
MCHC: 33.1 g/dL (ref 32.0–36.0)
MCV: 87.7 fL (ref 80.0–100.0)
MPV: 11.9 fL (ref 7.5–12.5)
Platelets: 197 10*3/uL (ref 140–400)
RBC: 4.72 10*6/uL (ref 3.80–5.10)
RDW: 12.9 % (ref 11.0–15.0)
WBC: 4.6 10*3/uL (ref 3.8–10.8)

## 2020-07-02 LAB — COMPREHENSIVE METABOLIC PANEL
AG Ratio: 1.5 (calc) (ref 1.0–2.5)
ALT: 20 U/L (ref 6–29)
AST: 19 U/L (ref 10–35)
Albumin: 4.5 g/dL (ref 3.6–5.1)
Alkaline phosphatase (APISO): 43 U/L (ref 31–125)
BUN: 12 mg/dL (ref 7–25)
CO2: 31 mmol/L (ref 20–32)
Calcium: 9.9 mg/dL (ref 8.6–10.2)
Chloride: 100 mmol/L (ref 98–110)
Creat: 0.77 mg/dL (ref 0.50–1.10)
Globulin: 3 g/dL (calc) (ref 1.9–3.7)
Glucose, Bld: 106 mg/dL — ABNORMAL HIGH (ref 65–99)
Potassium: 3.8 mmol/L (ref 3.5–5.3)
Sodium: 139 mmol/L (ref 135–146)
Total Bilirubin: 0.5 mg/dL (ref 0.2–1.2)
Total Protein: 7.5 g/dL (ref 6.1–8.1)

## 2020-07-02 LAB — HIV-1 RNA QUANT-NO REFLEX-BLD
HIV 1 RNA Quant: 49 Copies/mL — ABNORMAL HIGH
HIV-1 RNA Quant, Log: 1.69 Log cps/mL — ABNORMAL HIGH

## 2020-07-02 LAB — RPR: RPR Ser Ql: NONREACTIVE

## 2020-07-02 LAB — TSH: TSH: 1.24 mIU/L

## 2020-07-11 ENCOUNTER — Other Ambulatory Visit: Payer: Self-pay | Admitting: Infectious Diseases

## 2020-07-11 DIAGNOSIS — I10 Essential (primary) hypertension: Secondary | ICD-10-CM

## 2020-08-22 ENCOUNTER — Encounter: Payer: Self-pay | Admitting: Plastic Surgery

## 2020-08-22 ENCOUNTER — Other Ambulatory Visit: Payer: Self-pay

## 2020-08-22 ENCOUNTER — Ambulatory Visit (INDEPENDENT_AMBULATORY_CARE_PROVIDER_SITE_OTHER): Payer: 59 | Admitting: Plastic Surgery

## 2020-08-22 VITALS — BP 148/106 | HR 75 | Ht 66.0 in | Wt 268.0 lb

## 2020-08-22 DIAGNOSIS — L910 Hypertrophic scar: Secondary | ICD-10-CM

## 2020-08-22 NOTE — Progress Notes (Signed)
Referring Provider Campbell Riches, MD [place],  [place] [address]   CC:  Chief Complaint  Patient presents with   consult      Courtney Chavez is an 45 y.o. female.  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  She tried to pierce her ear by herself about 8 years ago and developed a small keloid there.  Is not currently changing in size as far she can tell.  No Known Allergies  Outpatient Encounter Medications as of 08/22/2020  Medication Sig   abacavir-dolutegravir-lamiVUDine (TRIUMEQ) 600-50-300 MG tablet Take 1 tablet by mouth daily.   acetaminophen (TYLENOL) 325 MG tablet Take 2 tablets (650 mg total) by mouth every 4 (four) hours as needed (for pain scale < 4).   aspirin EC 81 MG tablet Take 1 tablet (81 mg total) by mouth daily.   baclofen (LIORESAL) 10 MG tablet Take 0.5-1 tablets (5-10 mg total) by mouth 3 (three) times daily as needed for muscle spasms.   diclofenac Sodium (VOLTAREN) 1 % GEL Apply 4 g topically 4 (four) times daily.   hydrochlorothiazide (HYDRODIURIL) 25 MG tablet TAKE 1/2 TABLET(12.5 MG) BY MOUTH DAILY   ibuprofen (ADVIL) 600 MG tablet Take 1 tablet (600 mg total) by mouth every 6 (six) hours.   lisinopril (ZESTRIL) 20 MG tablet TAKE 1 TABLET(20 MG) BY MOUTH DAILY   naproxen (NAPROSYN) 500 MG tablet Take 1 tablet (500 mg total) by mouth 2 (two) times daily as needed.   traMADol (ULTRAM) 50 MG tablet Take 1 tablet (50 mg total) by mouth every 6 (six) hours as needed.   methocarbamol (ROBAXIN) 750 MG tablet Take 1 tablet (750 mg total) by mouth every 6 (six) hours as needed for muscle spasms. (Patient not taking: Reported on 08/22/2020)   orlistat (XENICAL) 120 MG capsule Take 1 capsule (120 mg total) by mouth 3 (three) times daily with meals. (Patient not taking: Reported on 08/22/2020)   No facility-administered encounter medications on file as of 08/22/2020.     Past Medical History:   Diagnosis Date   Gestational diabetes    metformin   HIV (human immunodeficiency virus infection) ([place])    HIV infection ([place])    Hypertension     Past Surgical History:  Procedure Laterality Date   NO PAST SURGERIES      Family History  Problem Relation Age of Onset   Hypertension Mother    Hypertension Father    Hypertension Brother     Social History   Social History Narrative   Not on file     Review of Systems General: Denies fevers, chills, weight loss CV: Denies chest pain, shortness of breath, palpitations  Physical Exam Vitals with BMI 08/22/2020 06/28/2020 06/05/2020  Height 5\' 6"  - 5\' 4"   Weight 268 lbs 262 lbs 260 lbs  BMI 84.13 - 24.40  Systolic 102 - -  Diastolic 725 - -  Pulse 75 - -    General:  No acute distress,  Alert and oriented, Non-Toxic, Normal speech and affect Examination shows a 1 cm keloid type lesion on the helical rim of the right ear.  Surrounding skin is soft.  Assessment/Plan Patient presents with a likely keloid on the helical rim of the right ear.  We discussed excision.  We discussed the risks include bleeding, infection, damage to surrounding structures need for additional procedures.  We discussed the potential for recurrence.  All  of her questions were answered and plan to move ahead.  Cindra Presume 08/22/2020, 5:18 PM

## 2020-08-31 ENCOUNTER — Other Ambulatory Visit: Payer: Self-pay | Admitting: Infectious Diseases

## 2020-09-06 ENCOUNTER — Telehealth: Payer: Self-pay

## 2020-09-06 ENCOUNTER — Other Ambulatory Visit: Payer: Self-pay

## 2020-09-06 ENCOUNTER — Other Ambulatory Visit (HOSPITAL_COMMUNITY): Payer: Self-pay

## 2020-09-06 ENCOUNTER — Ambulatory Visit: Payer: 59

## 2020-09-06 ENCOUNTER — Other Ambulatory Visit: Payer: Self-pay | Admitting: Family

## 2020-09-06 MED ORDER — BICTEGRAVIR-EMTRICITAB-TENOFOV 50-200-25 MG PO TABS: 1.0000 | ORAL_TABLET | Freq: Every day | ORAL | 2 refills | Status: DC

## 2020-09-06 NOTE — Telephone Encounter (Signed)
[facility] Patient Advocate Encounter   Was successful in obtaining a [employer] copay card for Biktarvy.  This copay card will make the patients copay $0.00.  I have spoken with the patient.         Bonnell Placzek, CPhT [facility] Phone: [phone] Fax:  [phone]  

## 2020-09-06 NOTE — Progress Notes (Unsigned)
iktarv

## 2020-09-11 ENCOUNTER — Other Ambulatory Visit: Payer: 59

## 2020-09-18 ENCOUNTER — Other Ambulatory Visit: Payer: Self-pay | Admitting: Infectious Diseases

## 2020-09-18 DIAGNOSIS — I10 Essential (primary) hypertension: Secondary | ICD-10-CM

## 2020-09-24 ENCOUNTER — Telehealth: Payer: Self-pay | Admitting: Orthopaedic Surgery

## 2020-09-24 NOTE — Telephone Encounter (Signed)
Pt called stating she would like a CB to discuss getting scheduled for surgery.   [phone]

## 2020-09-25 ENCOUNTER — Encounter: Payer: Self-pay | Admitting: Infectious Diseases

## 2020-09-25 ENCOUNTER — Other Ambulatory Visit: Payer: Self-pay

## 2020-09-25 ENCOUNTER — Ambulatory Visit (INDEPENDENT_AMBULATORY_CARE_PROVIDER_SITE_OTHER): Payer: 59 | Admitting: Infectious Diseases

## 2020-09-25 ENCOUNTER — Other Ambulatory Visit (HOSPITAL_COMMUNITY): Payer: Self-pay

## 2020-09-25 VITALS — BP 160/110 | HR 71 | Temp 98.9°F | Wt 269.0 lb

## 2020-09-25 DIAGNOSIS — Z6841 Body Mass Index (BMI) 40.0 and over, adult: Secondary | ICD-10-CM | POA: Diagnosis not present

## 2020-09-25 DIAGNOSIS — I10 Essential (primary) hypertension: Secondary | ICD-10-CM | POA: Diagnosis not present

## 2020-09-25 DIAGNOSIS — Z113 Encounter for screening for infections with a predominantly sexual mode of transmission: Secondary | ICD-10-CM | POA: Diagnosis not present

## 2020-09-25 DIAGNOSIS — B359 Dermatophytosis, unspecified: Secondary | ICD-10-CM

## 2020-09-25 DIAGNOSIS — B20 Human immunodeficiency virus [HIV] disease: Secondary | ICD-10-CM | POA: Diagnosis not present

## 2020-09-25 DIAGNOSIS — Z79899 Other long term (current) drug therapy: Secondary | ICD-10-CM

## 2020-09-25 MED ORDER — CLOTRIMAZOLE-BETAMETHASONE 1-0.05 % EX CREA: 1.0000 "application " | TOPICAL_CREAM | Freq: Two times a day (BID) | CUTANEOUS | 0 refills | Status: DC

## 2020-09-25 NOTE — Assessment & Plan Note (Signed)
Will give her rx for clotrimazole.

## 2020-09-25 NOTE — Assessment & Plan Note (Signed)
She is interested in gastric surgery.  Will refer her to bariatrics clinic.

## 2020-09-25 NOTE — Assessment & Plan Note (Addendum)
She is doing well, blip. Continue her current rx. Likes smaller pill.  vax are up to date (has gotten covid vax).  Offered/refused condoms.  Will see her back in 6 months.
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
## 2020-09-25 NOTE — Assessment & Plan Note (Signed)
She has PCP She is asx except for headaches.  Will defer to PCP for tx change.

## 2020-10-03 ENCOUNTER — Other Ambulatory Visit: Payer: Self-pay | Admitting: Infectious Diseases

## 2020-10-03 DIAGNOSIS — I10 Essential (primary) hypertension: Secondary | ICD-10-CM

## 2020-10-03 NOTE — Telephone Encounter (Signed)
Sent MyChart message to assess if patient is still taking.

## 2020-10-09 ENCOUNTER — Encounter: Payer: Self-pay | Admitting: Infectious Diseases

## 2020-10-10 ENCOUNTER — Telehealth: Payer: Self-pay | Admitting: Orthopaedic Surgery

## 2020-10-10 NOTE — Telephone Encounter (Signed)
Patient last seen 06-05-20 for knee pain and is ready to schedule surgery for left knee.  She has [facility].  Please provide sheet.

## 2020-10-10 NOTE — Telephone Encounter (Signed)
We received a phone call from the patient stating that her symptoms have worsened since we last saw her in April.  She is now having severe pain as well as mechanical symptoms to the lateral aspect of the knee.  She wishes to proceed with arthroscopic surgery that we had discussed at her prior visit.  We will reach out to her to schedule surgery in the near future.

## 2020-10-24 ENCOUNTER — Telehealth: Payer: Self-pay

## 2020-10-24 NOTE — Telephone Encounter (Signed)
Patient called requesting refill of hctz and lisinopril, states she is no longer seeing a PCP. Medications have not been ordered since 06/2020, routing to provider.   Beryle Flock, RN

## 2020-10-26 ENCOUNTER — Telehealth: Payer: Self-pay

## 2020-10-26 ENCOUNTER — Other Ambulatory Visit: Payer: Self-pay | Admitting: Infectious Diseases

## 2020-10-26 DIAGNOSIS — I10 Essential (primary) hypertension: Secondary | ICD-10-CM

## 2020-10-26 MED ORDER — HYDROCHLOROTHIAZIDE 25 MG PO TABS: ORAL_TABLET | ORAL | 3 refills | Status: DC

## 2020-10-26 MED ORDER — LISINOPRIL 20 MG PO TABS: ORAL_TABLET | ORAL | 3 refills | Status: DC

## 2020-10-26 NOTE — Telephone Encounter (Signed)
[facility] denied pts surgery stating that patient did not meet their criteria: physical exam only showed a single positive finding without evidence of confirmatory values including the fact that the McMurray test was only listed as equivocal. There was lack of documentation of a formal full course of PT.  Peer to peer set up for 10/30/20 or 11/06/20 1-5 pm. FYI-P2P's with [facility] always have to be set up for the afternoon because they are in a different time zone and don't come in until after 11:00 am our time.   [id number]. Denial letter left at your desk.

## 2020-10-29 NOTE — Telephone Encounter (Signed)
ok 

## 2020-10-31 ENCOUNTER — Other Ambulatory Visit (HOSPITAL_COMMUNITY)
Admission: RE | Admit: 2020-10-31 | Discharge: 2020-10-31 | Disposition: A | Discharge status: Home / Self Care | Payer: 59 | Source: Ambulatory Visit | Attending: Plastic Surgery | Admitting: Plastic Surgery

## 2020-10-31 ENCOUNTER — Ambulatory Visit (INDEPENDENT_AMBULATORY_CARE_PROVIDER_SITE_OTHER): Payer: 59 | Admitting: Plastic Surgery

## 2020-10-31 ENCOUNTER — Other Ambulatory Visit: Payer: Self-pay

## 2020-10-31 ENCOUNTER — Encounter: Payer: Self-pay | Admitting: Plastic Surgery

## 2020-10-31 VITALS — BP 165/105 | HR 75

## 2020-10-31 DIAGNOSIS — L910 Hypertrophic scar: Secondary | ICD-10-CM

## 2020-10-31 NOTE — Progress Notes (Signed)
Operative Note   DATE OF OPERATION: 10/31/2020  LOCATION:    SURGICAL DEPARTMENT: Plastic Surgery  PREOPERATIVE DIAGNOSES: Right ear keloid  POSTOPERATIVE DIAGNOSES:  same  PROCEDURE:  Excision of right ear keloid measuring 2.5 cm Complex closure measuring 2.5 cm  SURGEON: Talmadge Coventry, MD  ANESTHESIA:  Local  COMPLICATIONS: None.   INDICATIONS FOR PROCEDURE:  The patient, Courtney Chavez is a 45 y.o. female born on 05/30/1975, is here for treatment of right ear keloid MRN: [id number]  CONSENT:  Informed consent was obtained directly from the patient. Risks, benefits and alternatives were fully discussed. Specific risks including but not limited to bleeding, infection, hematoma, seroma, scarring, pain, infection, wound healing problems, and need for further surgery were all discussed. The patient did have an ample opportunity to have questions answered to satisfaction.   DESCRIPTION OF PROCEDURE:  Local anesthesia was administered. The patient's operative site was prepped and draped in a sterile fashion. A time out was performed and all information was confirmed to be correct.  The lesion was excised with a 15 blade.  Hemostasis was obtained.  Circumferential undermining was performed and the skin was advanced and closed in layers with interrupted buried Monocryl sutures and 5-0 Monocryl for the skin.  The lesion excised measured 2.5 cm, and the total length of closure measured 2.5 cm.    The patient tolerated the procedure well.  There were no complications.

## 2020-10-31 NOTE — Addendum Note (Signed)
Addended by: Harl Bowie on: 10/31/2020 02:35 PM   Modules accepted: Orders

## 2020-11-01 ENCOUNTER — Other Ambulatory Visit: Payer: Self-pay

## 2020-11-05 LAB — SURGICAL PATHOLOGY

## 2020-11-07 ENCOUNTER — Ambulatory Visit: Payer: 59 | Admitting: Plastic Surgery

## 2020-11-08 ENCOUNTER — Other Ambulatory Visit: Payer: Self-pay

## 2020-11-08 ENCOUNTER — Encounter (HOSPITAL_BASED_OUTPATIENT_CLINIC_OR_DEPARTMENT_OTHER): Payer: Self-pay | Admitting: Orthopaedic Surgery

## 2020-11-13 ENCOUNTER — Encounter (HOSPITAL_BASED_OUTPATIENT_CLINIC_OR_DEPARTMENT_OTHER)
Admission: RE | Admit: 2020-11-13 | Discharge: 2020-11-13 | Disposition: A | Discharge status: Home / Self Care | Payer: 59 | Source: Ambulatory Visit | Attending: Orthopaedic Surgery | Admitting: Orthopaedic Surgery

## 2020-11-13 DIAGNOSIS — Z79899 Other long term (current) drug therapy: Secondary | ICD-10-CM | POA: Diagnosis not present

## 2020-11-13 DIAGNOSIS — Z791 Long term (current) use of non-steroidal anti-inflammatories (NSAID): Secondary | ICD-10-CM | POA: Diagnosis not present

## 2020-11-13 DIAGNOSIS — Y939 Activity, unspecified: Secondary | ICD-10-CM | POA: Diagnosis not present

## 2020-11-13 DIAGNOSIS — Z6841 Body Mass Index (BMI) 40.0 and over, adult: Secondary | ICD-10-CM | POA: Diagnosis not present

## 2020-11-13 DIAGNOSIS — Z7982 Long term (current) use of aspirin: Secondary | ICD-10-CM | POA: Diagnosis not present

## 2020-11-13 DIAGNOSIS — Z8249 Family history of ischemic heart disease and other diseases of the circulatory system: Secondary | ICD-10-CM | POA: Diagnosis not present

## 2020-11-13 DIAGNOSIS — Z01812 Encounter for preprocedural laboratory examination: Secondary | ICD-10-CM | POA: Insufficient documentation

## 2020-11-13 DIAGNOSIS — S83272A Complex tear of lateral meniscus, current injury, left knee, initial encounter: Secondary | ICD-10-CM | POA: Diagnosis present

## 2020-11-13 DIAGNOSIS — M659 Synovitis and tenosynovitis, unspecified: Secondary | ICD-10-CM | POA: Diagnosis not present

## 2020-11-13 DIAGNOSIS — X58XXXA Exposure to other specified factors, initial encounter: Secondary | ICD-10-CM | POA: Diagnosis not present

## 2020-11-13 LAB — POCT PREGNANCY, URINE: Preg Test, Ur: NEGATIVE

## 2020-11-13 LAB — BASIC METABOLIC PANEL
Anion gap: 7 (ref 5–15)
BUN: 10 mg/dL (ref 6–20)
CO2: 27 mmol/L (ref 22–32)
Calcium: 9.4 mg/dL (ref 8.9–10.3)
Chloride: 101 mmol/L (ref 98–111)
Creatinine, Ser: 0.77 mg/dL (ref 0.44–1.00)
GFR, Estimated: >60 mL/min (ref >60)
Glucose, Bld: 121 mg/dL — ABNORMAL HIGH (ref 70–99)
Potassium: 3.9 mmol/L (ref 3.5–5.1)
Sodium: 135 mmol/L (ref 135–145)

## 2020-11-13 NOTE — Progress Notes (Signed)
Sent text reminding pt to come in for lab work.  

## 2020-11-13 NOTE — Progress Notes (Signed)

## 2020-11-14 ENCOUNTER — Ambulatory Visit: Payer: 59 | Admitting: Plastic Surgery

## 2020-11-14 ENCOUNTER — Ambulatory Visit (HOSPITAL_BASED_OUTPATIENT_CLINIC_OR_DEPARTMENT_OTHER): Payer: 59 | Admitting: Certified Registered"

## 2020-11-14 ENCOUNTER — Other Ambulatory Visit: Payer: Self-pay

## 2020-11-14 ENCOUNTER — Encounter (HOSPITAL_BASED_OUTPATIENT_CLINIC_OR_DEPARTMENT_OTHER)
Admission: RE | Disposition: A | Discharge status: Home / Self Care | Payer: Self-pay | Source: Ambulatory Visit | Attending: Orthopaedic Surgery

## 2020-11-14 ENCOUNTER — Encounter: Payer: Self-pay | Admitting: Orthopaedic Surgery

## 2020-11-14 ENCOUNTER — Encounter (HOSPITAL_BASED_OUTPATIENT_CLINIC_OR_DEPARTMENT_OTHER): Payer: Self-pay | Admitting: Orthopaedic Surgery

## 2020-11-14 ENCOUNTER — Ambulatory Visit (HOSPITAL_BASED_OUTPATIENT_CLINIC_OR_DEPARTMENT_OTHER)
Admission: RE | Admit: 2020-11-14 | Discharge: 2020-11-14 | Disposition: A | Discharge status: Home / Self Care | Payer: 59 | Source: Ambulatory Visit | Attending: Orthopaedic Surgery | Admitting: Orthopaedic Surgery

## 2020-11-14 DIAGNOSIS — Z79899 Other long term (current) drug therapy: Secondary | ICD-10-CM | POA: Insufficient documentation

## 2020-11-14 DIAGNOSIS — X58XXXA Exposure to other specified factors, initial encounter: Secondary | ICD-10-CM | POA: Insufficient documentation

## 2020-11-14 DIAGNOSIS — M659 Synovitis and tenosynovitis, unspecified: Secondary | ICD-10-CM | POA: Insufficient documentation

## 2020-11-14 DIAGNOSIS — S83289A Other tear of lateral meniscus, current injury, unspecified knee, initial encounter: Secondary | ICD-10-CM

## 2020-11-14 DIAGNOSIS — S83272A Complex tear of lateral meniscus, current injury, left knee, initial encounter: Secondary | ICD-10-CM | POA: Insufficient documentation

## 2020-11-14 DIAGNOSIS — Y939 Activity, unspecified: Secondary | ICD-10-CM | POA: Insufficient documentation

## 2020-11-14 DIAGNOSIS — S83282D Other tear of lateral meniscus, current injury, left knee, subsequent encounter: Secondary | ICD-10-CM | POA: Diagnosis not present

## 2020-11-14 DIAGNOSIS — Z8249 Family history of ischemic heart disease and other diseases of the circulatory system: Secondary | ICD-10-CM | POA: Insufficient documentation

## 2020-11-14 DIAGNOSIS — Z7982 Long term (current) use of aspirin: Secondary | ICD-10-CM | POA: Insufficient documentation

## 2020-11-14 DIAGNOSIS — Z791 Long term (current) use of non-steroidal anti-inflammatories (NSAID): Secondary | ICD-10-CM | POA: Insufficient documentation

## 2020-11-14 DIAGNOSIS — Z6841 Body Mass Index (BMI) 40.0 and over, adult: Secondary | ICD-10-CM | POA: Insufficient documentation

## 2020-11-14 DIAGNOSIS — S83282A Other tear of lateral meniscus, current injury, left knee, initial encounter: Secondary | ICD-10-CM

## 2020-11-14 HISTORY — PX: KNEE ARTHROSCOPY WITH MEDIAL MENISECTOMY: SHX5651

## 2020-11-14 LAB — POCT PREGNANCY, URINE: Preg Test, Ur: NEGATIVE

## 2020-11-14 SURGERY — ARTHROSCOPY, KNEE, WITH MEDIAL MENISCECTOMY
Anesthesia: General | Site: Knee | Laterality: Left

## 2020-11-14 MED ORDER — SODIUM CHLORIDE 0.9 % IR SOLN
Status: DC | PRN
  Administered 2020-11-14: 9000 mL

## 2020-11-14 MED ORDER — BUPIVACAINE HCL (PF) 0.25 % IJ SOLN
INTRAMUSCULAR | Status: AC
  Filled 2020-11-14: qty 30

## 2020-11-14 MED ORDER — PHENYLEPHRINE HCL (PRESSORS) 10 MG/ML IV SOLN
INTRAVENOUS | Status: DC | PRN
  Administered 2020-11-14: 40 ug via INTRAVENOUS

## 2020-11-14 MED ORDER — MIDAZOLAM HCL 5 MG/5ML IJ SOLN
INTRAMUSCULAR | Status: DC | PRN
  Administered 2020-11-14: 2 mg via INTRAVENOUS

## 2020-11-14 MED ORDER — LIDOCAINE HCL (CARDIAC) PF 100 MG/5ML IV SOSY
PREFILLED_SYRINGE | INTRAVENOUS | Status: DC | PRN
  Administered 2020-11-14: 80 mg via INTRAVENOUS

## 2020-11-14 MED ORDER — LACTATED RINGERS IV SOLN: INTRAVENOUS | Status: DC

## 2020-11-14 MED ORDER — ACETAMINOPHEN 500 MG PO TABS
1000.0000 mg | ORAL_TABLET | Freq: Once | ORAL | Status: AC
  Administered 2020-11-14: 1000 mg via ORAL

## 2020-11-14 MED ORDER — ONDANSETRON HCL 4 MG/2ML IJ SOLN
INTRAMUSCULAR | Status: AC
  Filled 2020-11-14: qty 2

## 2020-11-14 MED ORDER — FENTANYL CITRATE (PF) 100 MCG/2ML IJ SOLN
INTRAMUSCULAR | Status: AC
  Filled 2020-11-14: qty 2

## 2020-11-14 MED ORDER — ATROPINE SULFATE 0.4 MG/ML IJ SOLN
INTRAMUSCULAR | Status: AC
  Filled 2020-11-14: qty 1

## 2020-11-14 MED ORDER — HYDROCODONE-ACETAMINOPHEN 5-325 MG PO TABS: 1.0000 | ORAL_TABLET | Freq: Four times a day (QID) | ORAL | 0 refills | Status: DC | PRN

## 2020-11-14 MED ORDER — ACETAMINOPHEN 500 MG PO TABS
ORAL_TABLET | ORAL | Status: AC
  Filled 2020-11-14: qty 2

## 2020-11-14 MED ORDER — DEXAMETHASONE SODIUM PHOSPHATE 10 MG/ML IJ SOLN
INTRAMUSCULAR | Status: DC | PRN
  Administered 2020-11-14: 5 mg via INTRAVENOUS

## 2020-11-14 MED ORDER — MIDAZOLAM HCL 2 MG/2ML IJ SOLN
INTRAMUSCULAR | Status: AC
  Filled 2020-11-14: qty 2

## 2020-11-14 MED ORDER — DEXAMETHASONE SODIUM PHOSPHATE 10 MG/ML IJ SOLN
INTRAMUSCULAR | Status: AC
  Filled 2020-11-14: qty 1

## 2020-11-14 MED ORDER — FENTANYL CITRATE (PF) 100 MCG/2ML IJ SOLN: 25.0000 ug | INTRAMUSCULAR | Status: DC | PRN

## 2020-11-14 MED ORDER — BUPIVACAINE HCL (PF) 0.25 % IJ SOLN
INTRAMUSCULAR | Status: DC | PRN
  Administered 2020-11-14: 20 mL

## 2020-11-14 MED ORDER — OXYCODONE HCL 5 MG PO TABS: 5.0000 mg | ORAL_TABLET | Freq: Once | ORAL | Status: DC | PRN

## 2020-11-14 MED ORDER — PROPOFOL 10 MG/ML IV BOLUS
INTRAVENOUS | Status: AC
  Filled 2020-11-14: qty 40

## 2020-11-14 MED ORDER — PROPOFOL 10 MG/ML IV BOLUS
INTRAVENOUS | Status: DC | PRN
  Administered 2020-11-14: 200 mg via INTRAVENOUS

## 2020-11-14 MED ORDER — ONDANSETRON HCL 4 MG/2ML IJ SOLN
INTRAMUSCULAR | Status: DC | PRN
  Administered 2020-11-14: 4 mg via INTRAVENOUS

## 2020-11-14 MED ORDER — CEFAZOLIN IN SODIUM CHLORIDE 3-0.9 GM/100ML-% IV SOLN
3.0000 g | INTRAVENOUS | Status: AC
  Administered 2020-11-14: 3 g via INTRAVENOUS

## 2020-11-14 MED ORDER — CEFAZOLIN IN SODIUM CHLORIDE 3-0.9 GM/100ML-% IV SOLN
INTRAVENOUS | Status: AC
  Filled 2020-11-14: qty 100

## 2020-11-14 MED ORDER — FENTANYL CITRATE (PF) 100 MCG/2ML IJ SOLN
INTRAMUSCULAR | Status: DC | PRN
  Administered 2020-11-14: 50 ug via INTRAVENOUS
  Administered 2020-11-14 (×3): 25 ug via INTRAVENOUS

## 2020-11-14 MED ORDER — AMISULPRIDE (ANTIEMETIC) 5 MG/2ML IV SOLN: 10.0000 mg | Freq: Once | INTRAVENOUS | Status: DC | PRN

## 2020-11-14 MED ORDER — PHENYLEPHRINE 40 MCG/ML (10ML) SYRINGE FOR IV PUSH (FOR BLOOD PRESSURE SUPPORT)
PREFILLED_SYRINGE | INTRAVENOUS | Status: AC
  Filled 2020-11-14: qty 10

## 2020-11-14 MED ORDER — OXYCODONE HCL 5 MG/5ML PO SOLN
5.0000 mg | Freq: Once | ORAL | Status: DC | PRN
Start: 2020-11-14 — End: 2020-11-14

## 2020-11-14 SURGICAL SUPPLY — 33 items
BANDAGE ESMARK 6X9 LF (GAUZE/BANDAGES/DRESSINGS) IMPLANT
BLADE EXCALIBUR 4.0X13 (MISCELLANEOUS) ×2 IMPLANT
BLADE SHAVER TORPEDO 4X13 (MISCELLANEOUS) IMPLANT
BNDG CMPR 9X6 STRL LF SNTH (GAUZE/BANDAGES/DRESSINGS)
BNDG ELASTIC 6X5.8 VLCR STR LF (GAUZE/BANDAGES/DRESSINGS) ×4 IMPLANT
BNDG ESMARK 6X9 LF (GAUZE/BANDAGES/DRESSINGS)
BURR OVAL 8 FLU 4.0X13 (MISCELLANEOUS) IMPLANT
COOLER ICEMAN CLASSIC (MISCELLANEOUS) ×2 IMPLANT
CUFF TOURN SGL QUICK 34 (TOURNIQUET CUFF) ×2
CUFF TRNQT CYL 34X4.125X (TOURNIQUET CUFF) ×1 IMPLANT
CUTTER BONE 4.0MM X 13CM (MISCELLANEOUS) IMPLANT
DRAPE ARTHROSCOPY W/POUCH 90 (DRAPES) ×2 IMPLANT
DRAPE IMP U-DRAPE 54X76 (DRAPES) ×2 IMPLANT
DRAPE U-SHAPE 47X51 STRL (DRAPES) ×2 IMPLANT
DURAPREP 26ML APPLICATOR (WOUND CARE) ×4 IMPLANT
EXCALIBUR 3.8MM X 13CM (MISCELLANEOUS) IMPLANT
GAUZE SPONGE 4X4 12PLY STRL (GAUZE/BANDAGES/DRESSINGS) ×2 IMPLANT
GAUZE XEROFORM 1X8 LF (GAUZE/BANDAGES/DRESSINGS) ×2 IMPLANT
GLOVE SURG SYN 7.5  E (GLOVE) ×2
GLOVE SURG SYN 7.5 E (GLOVE) ×1 IMPLANT
GLOVE SURG UNDER POLY LF SZ7 (GLOVE) ×8 IMPLANT
GLOVE SURG UNDER POLY LF SZ7.5 (GLOVE) ×2 IMPLANT
GOWN STRL REIN XL XLG (GOWN DISPOSABLE) ×4 IMPLANT
GOWN STRL REUS W/ TWL LRG LVL3 (GOWN DISPOSABLE) ×1 IMPLANT
GOWN STRL REUS W/TWL LRG LVL3 (GOWN DISPOSABLE) ×2
MANIFOLD NEPTUNE II (INSTRUMENTS) ×2 IMPLANT
PACK ARTHROSCOPY DSU (CUSTOM PROCEDURE TRAY) ×2 IMPLANT
PACK BASIN DAY SURGERY FS (CUSTOM PROCEDURE TRAY) ×2 IMPLANT
PAD COLD SHLDR WRAP-ON (PAD) ×2 IMPLANT
SHEET MEDIUM DRAPE 40X70 STRL (DRAPES) ×2 IMPLANT
SUT ETHILON 3 0 PS 1 (SUTURE) ×2 IMPLANT
TOWEL GREEN STERILE FF (TOWEL DISPOSABLE) ×2 IMPLANT
TUBING ARTHROSCOPY IRRIG 16FT (MISCELLANEOUS) ×2 IMPLANT

## 2020-11-14 NOTE — H&P (Signed)
PREOPERATIVE H&P  Chief Complaint: left knee lateral meniscal  tear  HPI: Courtney Chavez is a 45 y.o. female who presents for surgical treatment of left knee lateral meniscal  tear.  She denies any changes in medical history.  Past Medical History:  Diagnosis Date   Gestational diabetes    metformin   HIV (human immunodeficiency virus infection) ([place])    HIV infection ([place])    Hypertension    Past Surgical History:  Procedure Laterality Date   NO PAST SURGERIES     Social History   Socioeconomic History   Marital status: Single    Spouse name: Not on file   Number of children: Not on file   Years of education: Not on file   Highest education level: Not on file  Occupational History    Comment: unemployed  Tobacco Use   Smoking status: Never   Smokeless tobacco: Never  Vaping Use   Vaping Use: Never used  Substance and Sexual Activity   Alcohol use: Not Currently    Comment: rarely   Drug use: No   Sexual activity: Not Currently    Partners: Male    Birth control/protection: Condom    Comment: pt. declined condoms  Other Topics Concern   Not on file  Social History Narrative   Not on file   Social Determinants of Health   Financial Resource Strain: Not on file  Food Insecurity: Not on file  Transportation Needs: Not on file  Physical Activity: Not on file  Stress: Not on file  Social Connections: Not on file   Family History  Problem Relation Age of Onset   Hypertension Mother    Hypertension Father    Hypertension Brother    No Known Allergies Prior to Admission medications   Medication Sig Start Date End Date Taking? Authorizing Provider  acetaminophen (TYLENOL) 325 MG tablet Take 2 tablets (650 mg total) by mouth every 4 (four) hours as needed (for pain scale < 4). 09/02/18  Yes Autry-Lott, Naaman Plummer, DO  aspirin EC 81 MG tablet Take 1 tablet (81 mg total) by mouth daily. 05/05/18  Yes Donnamae Jude, MD  bictegravir-emtricitabine-tenofovir AF  (BIKTARVY) 50-200-25 MG TABS tablet Take 1 tablet by mouth daily. 09/06/20  Yes Golden Circle, FNP  diclofenac Sodium (VOLTAREN) 1 % GEL Apply 4 g topically 4 (four) times daily. 01/24/20  Yes Henson, Vickie L, NP-C  hydrochlorothiazide (HYDRODIURIL) 25 MG tablet TAKE 1/2 TABLET(12.5 MG) BY MOUTH DAILY 10/26/20  Yes Campbell Riches, MD  ibuprofen (ADVIL) 600 MG tablet Take 1 tablet (600 mg total) by mouth every 6 (six) hours. 09/02/18  Yes Autry-Lott, Naaman Plummer, DO  lisinopril (ZESTRIL) 20 MG tablet TAKE 1 TABLET(20 MG) BY MOUTH DAILY 10/26/20  Yes Campbell Riches, MD  naproxen (NAPROSYN) 500 MG tablet Take 1 tablet (500 mg total) by mouth 2 (two) times daily as needed. 01/17/20  Yes Volney American, PA-C  methocarbamol (ROBAXIN) 750 MG tablet Take 1 tablet (750 mg total) by mouth every 6 (six) hours as needed for muscle spasms. 02/06/20   Hilts, Legrand Como, MD     Positive ROS: All other systems have been reviewed and were otherwise negative with the exception of those mentioned in the HPI and as above.  Physical Exam: General: Alert, no acute distress Cardiovascular: No pedal edema Respiratory: No cyanosis, no use of accessory musculature GI: abdomen soft Skin: No lesions in the area of chief complaint Neurologic: Sensation intact distally Psychiatric:  Patient is competent for consent with normal mood and affect Lymphatic: no lymphedema  MUSCULOSKELETAL: exam stable  Assessment: left knee lateral meniscal  tear  Plan: Plan for Procedure(s): left knee partial lateral meniscectomy  The risks benefits and alternatives were discussed with the patient including but not limited to the risks of nonoperative treatment, versus surgical intervention including infection, bleeding, nerve injury,  blood clots, cardiopulmonary complications, morbidity, mortality, among others, and they were willing to proceed.   Preoperative templating of the joint replacement has been completed, documented,  and submitted to the Operating Room personnel in order to optimize intra-operative equipment management.   Eduard Roux, MD 11/14/2020 10:05 AM

## 2020-11-14 NOTE — Anesthesia Preprocedure Evaluation (Signed)
Anesthesia Evaluation  Patient identified by MRN, date of birth, ID band Patient awake    Reviewed: Allergy & Precautions, NPO status , Patient's Chart, lab work & pertinent test results  Airway Mallampati: II  TM Distance: >3 FB     Dental  (+) Dental Advisory Given   Pulmonary neg pulmonary ROS,    breath sounds clear to auscultation       Cardiovascular hypertension, Pt. on medications negative cardio ROS   Rhythm:Regular Rate:Normal     Neuro/Psych negative neurological ROS  negative psych ROS   GI/Hepatic negative GI ROS, Neg liver ROS,   Endo/Other  Morbid obesity  Renal/GU negative Renal ROS  negative genitourinary   Musculoskeletal negative musculoskeletal ROS (+)   Abdominal   Peds negative pediatric ROS (+)  Hematology  (+) HIV,   Anesthesia Other Findings   Reproductive/Obstetrics negative OB ROS                             Anesthesia Physical Anesthesia Plan  ASA: 3  Anesthesia Plan: General   Post-op Pain Management:    Induction: Intravenous  PONV Risk Score and Plan: 3 and Dexamethasone, Ondansetron, Treatment may vary due to age or medical condition and Midazolam  Airway Management Planned: LMA  Additional Equipment:   Intra-op Plan:   Post-operative Plan: Extubation in OR  Informed Consent: I have reviewed the patients History and Physical, chart, labs and discussed the procedure including the risks, benefits and alternatives for the proposed anesthesia with the patient or authorized representative who has indicated his/her understanding and acceptance.       Plan Discussed with:   Anesthesia Plan Comments:         Anesthesia Quick Evaluation

## 2020-11-14 NOTE — Transfer of Care (Signed)
Immediate Anesthesia Transfer of Care Note  Patient: Vicie Mendizabal  Procedure(s) Performed: left knee partial lateral meniscectomy (Left: Knee)  Patient Location: PACU  Anesthesia Type:General  Level of Consciousness: drowsy  Airway & Oxygen Therapy: Patient Spontanous Breathing and Patient connected to face mask oxygen  Post-op Assessment: Report given to RN and Post -op Vital signs reviewed and stable  Post vital signs: Reviewed and stable  Last Vitals:  Vitals Value Taken Time  BP 132/84 11/14/20 1308  Temp    Pulse 61 11/14/20 1310  Resp 19 11/14/20 1310  SpO2 100 % 11/14/20 1310  Vitals shown include unvalidated device data.  Last Pain:  Vitals:   11/14/20 1009  TempSrc: Oral  PainSc: 8       Patients Stated Pain Goal: 8 (37/95/58 3167)  Complications: No notable events documented.

## 2020-11-14 NOTE — Anesthesia Procedure Notes (Signed)
Procedure Name: LMA Insertion Date/Time: 11/14/2020 11:52 AM Performed by: Lavonia Dana, CRNA Pre-anesthesia Checklist: Patient identified, Emergency Drugs available, Suction available and Patient being monitored Patient Re-evaluated:Patient Re-evaluated prior to induction Oxygen Delivery Method: Circle system utilized Preoxygenation: Pre-oxygenation with 100% oxygen Induction Type: IV induction Ventilation: Mask ventilation without difficulty LMA: LMA inserted LMA Size: 4.0 Number of attempts: 1 Airway Equipment and Method: Bite block Placement Confirmation: positive ETCO2 Tube secured with: Tape Dental Injury: Teeth and Oropharynx as per pre-operative assessment

## 2020-11-14 NOTE — Op Note (Signed)
   Surgery Date: 11/14/2020  PREOPERATIVE DIAGNOSES:  1. Left knee lateral meniscus tear 2. Left knee synovitis  POSTOPERATIVE DIAGNOSES:  same  PROCEDURES PERFORMED:  1. Left knee arthroscopy with major synovectomy 2. Left knee arthroscopy with arthroscopic partial lateral meniscectomy 3. Left knee arthroscopy with arthroscopic chondroplasty lateral femoral condyle   SURGEON: N. Eduard Roux, M.D.  ASSIST: Ciro Backer [place], [place]; necessary for the timely completion of procedure and due to complexity of procedure.  ANESTHESIA:  general  FLUIDS: Per anesthesia record.   ESTIMATED BLOOD LOSS: minimal  * No implants in log *  DESCRIPTION OF PROCEDURE: Ms. Lapierre is a 45 y.o.-year-old female with above mentioned conditions. Full discussion held regarding risks benefits alternatives and complications related surgical intervention. Conservative care options reviewed. All questions answered.  The patient was identified in the preoperative holding area and the operative extremity was marked. The patient was brought to the operating room and transferred to operating table in a supine position. Satisfactory general anesthesia was induced by anesthesiology.    Standard anterolateral, anteromedial arthroscopy portals were obtained. The anteromedial portal was obtained with a spinal needle for localization under direct visualization with subsequent diagnostic findings.   Diagnostic knee arthroscopy was first performed which revealed moderate to the synovitis in the femoral-tibial compartments as well as the patellofemoral compartments in the gutters.  Major synovectomy was performed with oscillating shaver and all of these areas.  Gentle valgus force was placed on the knee to inspect the medial compartment which was unremarkable.  No evidence of chondromalacia.  The cruciates were unremarkable.  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  Gentle varus force placed on the need  to inspect the lateral compartment.  We found a complex tear of the lateral meniscus involving the mid body all the way to the posterior horn near the root.  The root itself was intact.  The tear had horizontal and radial components.  The worst and the largest tear was in the mid body which involved about 50% of the volume.  Partial lateral meniscectomy was performed with a combination of meniscus basket oscillating shaver back to stable margins.  The knee was then placed in extension and the patellofemoral compartment demonstrated grade I chondromalacia as well.  Gutters were checked for loose bodies.  Excess fluid was removed from the knee joint.  Incisions were closed with interrupted nylon sutures.  Sterile dressings were applied.  Patient tolerated procedure well had no immediate complications.  Suprapatellar pouch and gutters: moderate synovitis or debris. Patella chondral surface: Grade 1 Trochlear chondral surface: Grade 1 Patellofemoral tracking: normal Medial meniscus: normal.  Medial femoral condyle weight bearing surface: Grade 0 Medial tibial plateau: Grade 0 Anterior cruciate ligament:stable Posterior cruciate ligament:stable Lateral meniscus: complex tear midbody and posterior horn.   Lateral femoral condyle weight bearing surface: Grade 1 Lateral tibial plateau: Grade 1  DISPOSITION: The patient was awakened from general anesthetic, extubated, taken to the recovery room in medically stable condition, no apparent complications. The patient may be weightbearing as tolerated to the operative lower extremity.  Range of motion of right knee as tolerated.  Azucena Cecil, MD [facility] 12:55 PM

## 2020-11-14 NOTE — Anesthesia Postprocedure Evaluation (Signed)
Anesthesia Post Note  Patient: Courtney Chavez  Procedure(s) Performed: left knee partial lateral meniscectomy (Left: Knee)     Patient location during evaluation: PACU Anesthesia Type: General Level of consciousness: awake and alert and oriented Pain management: pain level controlled Vital Signs Assessment: post-procedure vital signs reviewed and stable Respiratory status: spontaneous breathing, nonlabored ventilation and respiratory function stable Cardiovascular status: blood pressure returned to baseline and stable Postop Assessment: no apparent nausea or vomiting Anesthetic complications: no   No notable events documented.  Last Vitals:  Vitals:   11/14/20 1345 11/14/20 1450  BP:  (!) 153/96  Pulse: (!) 55 62  Resp: 17 18  Temp:  36.4 C  SpO2: 95% 97%    Last Pain:  Vitals:   11/14/20 1409  TempSrc:   PainSc: 0-No pain                 Amra Shukla A.

## 2020-11-14 NOTE — Discharge Instructions (Addendum)

## 2020-11-15 ENCOUNTER — Encounter (HOSPITAL_BASED_OUTPATIENT_CLINIC_OR_DEPARTMENT_OTHER): Payer: Self-pay | Admitting: Orthopaedic Surgery

## 2020-11-21 ENCOUNTER — Telehealth: Payer: Self-pay | Admitting: *Deleted

## 2020-11-21 ENCOUNTER — Encounter: Payer: Self-pay | Admitting: Orthopaedic Surgery

## 2020-11-21 ENCOUNTER — Encounter: Payer: Self-pay | Admitting: Plastic Surgery

## 2020-11-21 ENCOUNTER — Ambulatory Visit (INDEPENDENT_AMBULATORY_CARE_PROVIDER_SITE_OTHER): Payer: 59 | Admitting: Plastic Surgery

## 2020-11-21 ENCOUNTER — Other Ambulatory Visit: Payer: Self-pay

## 2020-11-21 ENCOUNTER — Ambulatory Visit (INDEPENDENT_AMBULATORY_CARE_PROVIDER_SITE_OTHER): Payer: 59 | Admitting: Orthopaedic Surgery

## 2020-11-21 DIAGNOSIS — S83282A Other tear of lateral meniscus, current injury, left knee, initial encounter: Secondary | ICD-10-CM

## 2020-11-21 DIAGNOSIS — L910 Hypertrophic scar: Secondary | ICD-10-CM | POA: Diagnosis not present

## 2020-11-21 NOTE — Progress Notes (Signed)
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  She overall feels like she is doing well.  On exam this is healing fine.  All remaining sutures were removed.  We will plan to see her again on an as-needed basis.  All of her questions were answered.
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
## 2020-11-21 NOTE — Telephone Encounter (Signed)
Pt is scheduled for US Vascular tomorrow at [facility] [place] at [facility]

## 2020-11-21 NOTE — Addendum Note (Signed)
Addended by: Precious Bard on: 11/21/2020 11:20 AM   Modules accepted: Orders

## 2020-11-22 ENCOUNTER — Ambulatory Visit (HOSPITAL_COMMUNITY)
Admission: RE | Admit: 2020-11-22 | Discharge: 2020-11-22 | Disposition: A | Discharge status: Home / Self Care | Payer: 59 | Source: Ambulatory Visit | Attending: Orthopaedic Surgery | Admitting: Orthopaedic Surgery

## 2020-11-22 ENCOUNTER — Telehealth: Payer: Self-pay

## 2020-11-22 DIAGNOSIS — S83282A Other tear of lateral meniscus, current injury, left knee, initial encounter: Secondary | ICD-10-CM | POA: Insufficient documentation

## 2020-11-22 NOTE — Telephone Encounter (Signed)
[facility] wanted to let Dr. Erlinda Hong know that patient is Negative for DVT, left leg.  Please advise.  Thank you.

## 2020-11-26 ENCOUNTER — Telehealth: Payer: Self-pay | Admitting: Family Medicine

## 2020-11-26 NOTE — Telephone Encounter (Signed)
Pt came in and dropped off form from [facility]. Please complete form and call pt at [phone].  Sending back in folder.

## 2020-11-26 NOTE — Telephone Encounter (Signed)
Thanks.  Can you make sure patient is aware

## 2020-11-27 NOTE — Progress Notes (Signed)
   Subjective:    Patient ID: Courtney Chavez, female    DOB: [birth year], 45 y.o.   MRN: [id number]  HPI Chief Complaint  Patient presents with   Knee Pain    Had left knee surgery 11/06/20, needs form filled out for weight loss surgery   She is here requesting that I provide a letter of necessity and documentation for her to qualify for weight loss surgery.  She is interested in the gastric sleeve.  She has been to the information session with [facility].  Review of her weights over the past 5 years show that she was 280 pounds at her heaviest.  She has tried various diets including keto and intermittent fasting and has lost 10-30+ pounds but has gained the weight back every time.  States due to her culture she has a high carbohydrate diet.  Her sister in law had the surgery and is doing well.   She has underlying health problems including hypertension, hyperlipidemia, HIV, history of gestational diabetes which increases her risk of developing diabetes in the future.  Recent knee pain with knee surgery.  Denies fever, chills, dizziness, chest pain, palpitations, shortness of breath, abdominal pain, N/V/D, urinary symptoms, LE edema.     Review of Systems Pertinent positives and negatives in the history of present illness.     Objective:   Physical Exam BP 136/78 (BP Location: Left Arm, Patient Position: Sitting)   Pulse 71   Ht 5\' 6"  (1.676 m)   Wt 267 lb 3.2 oz (121.2 kg)   LMP 11/19/2020 (Exact Date)   SpO2 98%   BMI 43.13 kg/m   Alert and oriented in no acute distress.  Respirations unlabored.  Normal speech, mood and memory.      Assessment & Plan:  BMI 40.0-44.9, adult (HCC)  Essential hypertension  History of gestational diabetes  Human immunodeficiency virus (HIV) disease ([place])  Hyperlipidemia, unspecified hyperlipidemia type  Needs flu shot - Plan: Flu Vaccine QUAD 6+ mos PF IM (Fluarix Quad PF)  She is here today in her usual state of  health for a consult regarding obesity and is considering weight loss surgery specifically, gastric sleeve surgery.  She appears to be a good candidate for this.  She has tried multiple diets over the past several years and has lost weight but each time has regained the weight.  I will provide her with a letter of necessity and documentation to support her case to have the desired surgery.  Her infectious disease doctor who treats her HIV also has recommended her for the weight loss surgery.

## 2020-11-27 NOTE — Telephone Encounter (Signed)
Pt called and advised that appt is needed in order for form to be completed. Form filed in brown folder to await appt.

## 2020-11-28 ENCOUNTER — Encounter: Payer: Self-pay | Admitting: Family Medicine

## 2020-11-28 ENCOUNTER — Other Ambulatory Visit: Payer: Self-pay

## 2020-11-28 ENCOUNTER — Ambulatory Visit (INDEPENDENT_AMBULATORY_CARE_PROVIDER_SITE_OTHER): Payer: 59 | Admitting: Family Medicine

## 2020-11-28 VITALS — BP 136/78 | HR 71 | Ht 66.0 in | Wt 267.2 lb

## 2020-11-28 DIAGNOSIS — I10 Essential (primary) hypertension: Secondary | ICD-10-CM

## 2020-11-28 DIAGNOSIS — Z8632 Personal history of gestational diabetes: Secondary | ICD-10-CM

## 2020-11-28 DIAGNOSIS — Z23 Encounter for immunization: Secondary | ICD-10-CM

## 2020-11-28 DIAGNOSIS — B20 Human immunodeficiency virus [HIV] disease: Secondary | ICD-10-CM | POA: Diagnosis not present

## 2020-11-28 DIAGNOSIS — E785 Hyperlipidemia, unspecified: Secondary | ICD-10-CM

## 2020-11-28 DIAGNOSIS — Z6841 Body Mass Index (BMI) 40.0 and over, adult: Secondary | ICD-10-CM | POA: Diagnosis not present

## 2020-11-29 ENCOUNTER — Other Ambulatory Visit: Payer: Self-pay | Admitting: Family

## 2020-12-18 ENCOUNTER — Other Ambulatory Visit: Payer: Self-pay | Admitting: Surgery

## 2020-12-18 ENCOUNTER — Other Ambulatory Visit (HOSPITAL_COMMUNITY): Payer: Self-pay | Admitting: Surgery

## 2020-12-18 DIAGNOSIS — Z01818 Encounter for other preprocedural examination: Secondary | ICD-10-CM

## 2020-12-19 ENCOUNTER — Ambulatory Visit (INDEPENDENT_AMBULATORY_CARE_PROVIDER_SITE_OTHER): Payer: 59 | Admitting: Orthopaedic Surgery

## 2020-12-19 ENCOUNTER — Encounter: Payer: Self-pay | Admitting: Orthopaedic Surgery

## 2020-12-19 ENCOUNTER — Other Ambulatory Visit: Payer: Self-pay

## 2020-12-19 DIAGNOSIS — S83282A Other tear of lateral meniscus, current injury, left knee, initial encounter: Secondary | ICD-10-CM

## 2020-12-19 MED ORDER — DICLOFENAC SODIUM 1 % EX GEL: 2.0000 g | Freq: Four times a day (QID) | CUTANEOUS | 5 refills | Status: DC
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
## 2020-12-25 ENCOUNTER — Ambulatory Visit (HOSPITAL_COMMUNITY)
Admission: RE | Admit: 2020-12-25 | Discharge: 2020-12-25 | Disposition: A | Discharge status: Home / Self Care | Payer: 59 | Source: Ambulatory Visit | Attending: Surgery | Admitting: Surgery

## 2020-12-25 ENCOUNTER — Other Ambulatory Visit: Payer: Self-pay

## 2020-12-25 DIAGNOSIS — Z01818 Encounter for other preprocedural examination: Secondary | ICD-10-CM | POA: Diagnosis present

## 2021-01-17 ENCOUNTER — Encounter: Payer: Self-pay | Admitting: Family Medicine

## 2021-01-17 ENCOUNTER — Other Ambulatory Visit: Payer: Self-pay

## 2021-01-17 ENCOUNTER — Ambulatory Visit (INDEPENDENT_AMBULATORY_CARE_PROVIDER_SITE_OTHER): Payer: 59 | Admitting: Family Medicine

## 2021-01-17 VITALS — BP 160/108 | HR 76 | Ht 66.0 in | Wt 273.6 lb

## 2021-01-17 DIAGNOSIS — M5442 Lumbago with sciatica, left side: Secondary | ICD-10-CM | POA: Diagnosis not present

## 2021-01-17 DIAGNOSIS — M419 Scoliosis, unspecified: Secondary | ICD-10-CM

## 2021-01-17 DIAGNOSIS — M5441 Lumbago with sciatica, right side: Secondary | ICD-10-CM | POA: Diagnosis not present

## 2021-01-17 DIAGNOSIS — I10 Essential (primary) hypertension: Secondary | ICD-10-CM | POA: Diagnosis not present

## 2021-01-17 LAB — POCT URINALYSIS DIP (PROADVANTAGE DEVICE)
Bilirubin, UA: NEGATIVE
Glucose, UA: NEGATIVE mg/dL
Ketones, POC UA: NEGATIVE mg/dL
Leukocytes, UA: NEGATIVE
Nitrite, UA: NEGATIVE
Specific Gravity, Urine: 1.025
Urobilinogen, Ur: NEGATIVE
pH, UA: 6 (ref 5.0–8.0)

## 2021-01-17 MED ORDER — MELOXICAM 15 MG PO TABS: 15.0000 mg | ORAL_TABLET | Freq: Every day | ORAL | 0 refills | Status: DC

## 2021-01-17 MED ORDER — KETOROLAC TROMETHAMINE 60 MG/2ML IM SOLN
60.0000 mg | Freq: Once | INTRAMUSCULAR | Status: AC
  Administered 2021-01-17: 60 mg via INTRAMUSCULAR

## 2021-01-17 NOTE — Patient Instructions (Addendum)
Take meloxicam once daily with food.  You must wait 6 hours after today's injection before you take it. Take it once daily until your pain has completely resolved.  I suspect you'll need it for at least 7-10 days.  You can take it for the full 15 days if you need it. Do not take any ibuprofen/advil/motrin or aleve/naproxen, or Goody/BC powder while taking the meloxicam.  You may use tylenol arthritis or extra strength tylenol as needed in addition to the meloxicam if needed for pain relief.  Use the hydrocodone very sparingly (due to side effects and risks--sleepiness, constipation, and risk for abuse), and do NOT take any tylenol while using your Norco (hydrocodone and acetaminophen, which is tylenol).  Next steps, if not improving, would be either to see a chiropractor to help with this acute pain, or a physical therapist.  Physical therapy might be helpful going forward, to help strengthen your back, given the lifting that you do and your scoliosis, to try and prevent further back problems.  Continue to use heat, and also do the stretches shown (crossing the ankle to the opposite knee, and pressing on the knee to stretch the hip. 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 If that is the case, they will be managing your pain, and take care of any prescriptions, referrals, therapy, imaging, etc. If they do not think it is work-related (don't use worker's comp), then we are happy to manage you. 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  Please try and drink more water (your urine was concentrated).

## 2021-01-17 NOTE — Progress Notes (Signed)
Chief Complaint  Patient presents with   Back Pain    Low back pain, lower mid back. She says that the pain sometimes radiates to her legs. Pain started about a week ago. Has been using heating pain, hydrocodone (from recent knee surgery) and ibuprofen. She said if she could rate the pain on a scale of 1-10, it would be like a 9.5.   Early last week she started with back pain.  She has h/o intermittent back pain, but never this bad.  She woke up with the pain one morning.  Pain is right at the top of the gluteal cleft, in the center. Pain spreads across the lower back (equally on both sides) and into both buttocks, and sometimes down the backs of both legs to the knees. This radiation is intermittent. Currently pain is just in the low back.  Prior to onset of pain, she hadn't been aware of any injury or change in activity. She did work the day prior. She works in [employer].  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 She thinks it may be related to her job. She reports doing this job for a long time, but she used to do 1:1 work. Now working in a nursing home, many more patients to lift.  Heating pad helps temporarily (for about an hour). She took ibuprofen (rx) three times last week (1/day) didn't ease the pain, so then also took hydrocodone (leftover from knee surgery), which helped some. Took hydrocodone last night   PMH, PSH, SH reviewed HTN, HIV L knee surgery 10/2020 X-ray of LS spine 01/2020, mild-mod thoracolumbar scoliosis.   Outpatient Encounter Medications as of 01/17/2021  Medication Sig Note   aspirin EC 81 MG tablet Take 1 tablet (81 mg total) by mouth daily.    BIKTARVY 50-200-25 MG TABS tablet TAKE 1 TABLET BY MOUTH DAILY    diclofenac Sodium (VOLTAREN) 1 % GEL Apply 4 g topically 4 (four) times daily.    hydrochlorothiazide (HYDRODIURIL) 25 MG tablet TAKE 1/2 TABLET(12.5 MG) BY MOUTH DAILY    HYDROcodone-acetaminophen (NORCO) 5-325 MG tablet Take  1 tablet by mouth every 6 (six) hours as needed. 01/17/2021: Last dose last night 10:30pm   ibuprofen (ADVIL) 600 MG tablet Take 1 tablet (600 mg total) by mouth every 6 (six) hours. 01/17/2021: Last dose last weekend   lisinopril (ZESTRIL) 20 MG tablet TAKE 1 TABLET(20 MG) BY MOUTH DAILY    acetaminophen (TYLENOL) 325 MG tablet Take 2 tablets (650 mg total) by mouth every 4 (four) hours as needed (for pain scale < 4). (Patient not taking: Reported on 01/17/2021) 01/17/2021: Uses prn for pain   [DISCONTINUED] diclofenac Sodium (VOLTAREN) 1 % GEL Apply 2 g topically 4 (four) times daily.    [DISCONTINUED] methocarbamol (ROBAXIN) 750 MG tablet Take 1 tablet (750 mg total) by mouth every 6 (six) hours as needed for muscle spasms.    [DISCONTINUED] naproxen (NAPROSYN) 500 MG tablet Take 1 tablet (500 mg total) by mouth 2 (two) times daily as needed.    No facility-administered encounter medications on file as of 01/17/2021.   No Known Allergies   ROS:  denies any fever, chills, urinary complaints.  Denies any numbness, tingling or weakness. Denies URI symptoms, chest pain, shortness of breath. Back pain per HPI   PHYSICAL EXAM:  BP (!) 160/108   Pulse 76   Ht 5\' 6"  (1.676 m)   Wt 273 lb 9.6 oz (124.1 kg)   LMP 01/11/2021 (Exact  Date)   BMI 44.16 kg/m   Well-appearing, pleasant, obese female, in mild discomfort (she reports 9.5/10) HEENT: conjunctiva and sclera are clear, EOMI, wearing mask Heart: regular rate and rhythm Lungs: clear bilaterally Back: no CVA tenderness. No thoracic tenderness.  Tender along entire lumbar spine, centrally. No paraspinous muscle spasm. Tender at R>L SI joints and tender at deep gluteal muscles. Pain with pyriformis stretch Neuro: alert and oriented. Normal strength. Normal DTRs, symmetric. Normal gait Psych: normal mood, affect, grooming  Urine dip: small blood, trace protein.  SG 1.025.  negative nit/leuks Recently finished  cycle.  ASSESSMENT/PLAN:  Acute bilateral low back pain with bilateral sciatica - Plan: POCT Urinalysis DIP (Proadvantage Device), meloxicam (MOBIC) 15 MG tablet, ketorolac (TORADOL) injection 60 mg  Essential hypertension - BP very elevated, but in a lot of pain today. To monitor at home. cont current meds  Scoliosis of thoracic spine, unspecified scoliosis type - noted on x-ray last yr. Discussed that this can increase risk for issues with back pain and regular back exercises are encouraged.  Suspect inflammation at [facility] joint, with some pyriformis spasm and intermittent sciatica. Unsure why she has pain centrally at gluteal cleft, no e/o pilonidal cyst. Encouraged heat, NSAID (risks/precautions reviewed). Given toradol today. Will start mobic in 6 hours and take daily. Shown pyriformis stretches. Discussed PT to help with proper lifting, core strengthening, discussed stretching (hamstrings). Since she feels like this is exacerbated by work, she will talk to her job regarding [place].  If work-related injury, they should pick up care from here.

## 2021-01-22 ENCOUNTER — Other Ambulatory Visit: Payer: Self-pay | Admitting: Gastroenterology

## 2021-01-30 ENCOUNTER — Other Ambulatory Visit: Payer: Self-pay | Admitting: Family Medicine

## 2021-01-30 DIAGNOSIS — M5441 Lumbago with sciatica, right side: Secondary | ICD-10-CM

## 2021-01-30 DIAGNOSIS — M5442 Lumbago with sciatica, left side: Secondary | ICD-10-CM

## 2021-02-21 ENCOUNTER — Other Ambulatory Visit: Payer: Self-pay

## 2021-02-21 ENCOUNTER — Encounter: Payer: Managed Care, Other (non HMO) | Attending: Surgery | Admitting: Skilled Nursing Facility1

## 2021-02-21 ENCOUNTER — Encounter: Payer: Self-pay | Admitting: Skilled Nursing Facility1

## 2021-02-21 DIAGNOSIS — E669 Obesity, unspecified: Secondary | ICD-10-CM | POA: Diagnosis present

## 2021-02-21 NOTE — Progress Notes (Signed)
Nutrition Assessment for Bariatric Surgery Medical Nutrition Therapy  Patient was seen on 02/21/2021 for Pre-Operative Nutrition Assessment. Letter of approval faxed to [facility] bariatric surgery program coordinator on 02/21/2021.   Referral stated Supervised Weight Loss (SWL) visits needed: 6  Planned surgery: sleeve gastrectomy  Pt expectation of surgery: to lse weight Pt expectation of dietitian: none stated    NUTRITION ASSESSMENT   Anthropometrics  Start weight at [facility]: 273.2 lbs (date: 02/21/2021)  Height: 62 in BMI: 49.97 kg/m2     Clinical  Medical hx: HTN Medications: see list  Labs: glucose 143, cholesterol 218, LDL 153, a1C 7.1 Notable signs/symptoms: none reported including hypo or hyperglycemia  Any previous deficiencies? No  Micronutrient Nutrition Focused Physical Exam: Hair: No issues observed Eyes: No issues observed Mouth: No issues observed Neck: No issues observed Nails: No issues observed Skin: No issues observed  Lifestyle & Dietary Hx  Pt states she used to have reflux but that stopped since not eating too late but does have it with red sauce.    Did discuss diabetes with pt as well as HIV underestimating the A1C.   Pt states she works third shift.   Pt states she does not eat beef or pork.  Pt states she works a part time job 3 days a week 5pm-9pm; then 11pm-7am 4-6 days a week. Pt states she usually goes to sleep around 9am and wakes around 3-3:30 pm  Pt states she is very ready to start her workout regimen.   24-Hr Dietary Recall: wakes at 2pm; sancks on fruit about 2 times a day First Meal 4pm:  white rice + veggies + oil + spices + [place]  Snack:  Second Meal 7pm: white bread 2-3 slices + butter and egg Snack: fruit Third Meal 8am: hot chocolate + waffle + butter  Snack:  Beverages: hot chocolate, water, unsweet tea   Estimated Energy Needs Calories: 1500   NUTRITION DIAGNOSIS  Overweight/obesity ([place]-3.3)  related to past poor dietary habits and physical inactivity as evidenced by patient w/ planned sleeve gastrectomy surgery following dietary guidelines for continued weight loss.    NUTRITION INTERVENTION  Nutrition counseling (C-1) and education (E-2) to facilitate bariatric surgery goals.  Educated pt on micronutrient deficiencies post surgery and strategies to mitigate that risk   Pre-Op Goals Reviewed with the Patient Track food and beverage intake (pen and paper, MyFitness Pal, Baritastic app, etc.) Make healthy food choices while monitoring portion sizes Consume 3 meals per day or try to eat every 3-5 hours Avoid concentrated sugars and fried foods Keep sugar & fat in the single digits per serving on food labels Practice CHEWING your food (aim for applesauce consistency) Practice not drinking 15 minutes before, during, and 30 minutes after each meal and snack Avoid all carbonated beverages (ex: soda, sparkling beverages)  Limit caffeinated beverages (ex: coffee, tea, energy drinks) Avoid all sugar-sweetened beverages (ex: regular soda, sports drinks)  Avoid alcohol  Aim for 64-100 ounces of FLUID daily (with at least half of fluid intake being plain water)  Aim for at least 60-80 grams of PROTEIN daily Look for a liquid protein source that contains ?15 g protein and ?5 g carbohydrate (ex: shakes, drinks, shots) Make a list of non-food related activities Physical activity is an important part of a healthy lifestyle so keep it moving! The goal is to reach 150 minutes of exercise per week, including cardiovascular and weight baring activity. Get an over the counter vitamin D supplement   *  Goals that are bolded indicate the pt would like to start working towards these  Handouts Provided Include  Bariatric Surgery handouts (Nutrition Visits, Pre-Op Goals, Protein Shakes, Vitamins & Minerals)  Learning Style & Readiness for Change Teaching method utilized: Visual & Auditory   Demonstrated degree of understanding via: Teach Back  Readiness Level: contempaltive Barriers to learning/adherence to lifestyle change: none identified  RD's Notes for Next Visit Assess pts adherence to chosen goals     MONITORING & EVALUATION Dietary intake, weekly physical activity, body weight, and pre-op goals reached at next nutrition visit.    Next Steps  Patient is to follow up at [facility] for Pre-Op Class >2 weeks before surgery for further nutrition education.  Follow up in 1 month

## 2021-03-26 ENCOUNTER — Encounter (HOSPITAL_COMMUNITY): Payer: Self-pay | Admitting: Gastroenterology

## 2021-03-26 NOTE — Progress Notes (Signed)
Attempted to obtain medical history via telephone, unable to reach at this time. I left a voicemail to return pre surgical testing department's phone call.  

## 2021-03-28 ENCOUNTER — Encounter: Payer: Managed Care, Other (non HMO) | Attending: Surgery | Admitting: Skilled Nursing Facility1

## 2021-03-28 ENCOUNTER — Other Ambulatory Visit: Payer: Self-pay

## 2021-03-28 DIAGNOSIS — E669 Obesity, unspecified: Secondary | ICD-10-CM | POA: Diagnosis not present

## 2021-03-28 NOTE — Progress Notes (Signed)
Supervised Weight Loss Visit Bariatric Nutrition Education  Planned Surgery: sleeve gastrectomy     1 out of 6 SWL Appointments   NUTRITION ASSESSMENT  Anthropometrics  Start weight at [facility]: 273.2 lbs (date: 02/21/2021)  Weight: 274 pounds BMI: 50.12 kg/m2     Clinical  Medical hx: HTN Medications: see list  Labs: glucose 143, cholesterol 218, LDL 153, a1C 7.1 Notable signs/symptoms: none reported including hypo or hyperglycemia  Any previous deficiencies? No  Lifestyle & Dietary Hx  (Previous) Pt states she used to have reflux but that stopped since not eating too late but does have it with red sauce.   Did discuss diabetes with pt as well as HIV underestimating the A1C.  Pt states she works third shift.  Pt states she does not eat beef or pork. Pt states she works a part time job 3 days a week 5pm-9pm; then 11pm-7am 4-6 days a week. Pt states she usually goes to sleep around 9am and wakes around 3-3:30 pm   Pt states she will drink tea to curve her appetite so she does not eat at work. Pt states she has not been working on anything stating she has been procrastinating. Pt states when she gets home she just wants to eat what she wants and go to sleep and does not want to cook. Pt states she does not want to eat anything because she will gain weight: Dietitian educated pt on the need for the human body to consume nutrition.   Pt states she drives right by the gym on her way home so she can start workout out pretty easily especially when she did used to workout out before she had her 46 year old.   Estimated daily fluid intake:  oz Supplements:  Current average weekly physical activity: ADL's  24-Hr Dietary Recall: woke up around 1pm First Meal :  Snack:  Second Meal 5pm: (homemade)  taco: cubed meat + cabbage + cheese + chili + sour cream Snack:  Third Meal: yogurt + grapes  Snack: sometimes before bed (in the morning) 2 toast +honey butter + hot chocolate + milk   Beverages: water, hot chocolate, green tea, ginger tea   Estimated Energy Needs Calories: 1500   NUTRITION DIAGNOSIS  Overweight/obesity ([place]-3.3) related to past poor dietary habits and physical inactivity as evidenced by patient w/ planned sleeve gastrectomy surgery following dietary guidelines for continued weight loss.   NUTRITION INTERVENTION  Nutrition counseling (C-1) and education (E-2) to facilitate bariatric surgery goals. What happens when I dont eat?  Your body uses food as fuel to keep all the important organs and cells in your body running well. When you dont eat, your body doesnt get the fuel it needs and your organs and body parts can suffer.  The Heart & Circulation: Your heart is a muscle that can shrink and weaken when you dont eat. This can create circulation problems and an irregular heartbeat. Blood pressure can get very low during starvation and make you feel dizzy when you stand up.  The Stomach: Your stomach becomes smaller when you dont eat so when you start eating again, your stomach is likely to feel uncomfortable (you may have stomach aches and/or gas). Also, your stomach will not empty as fast making you feel full longer.  The Intestines: Your intestines will move food slowly often resulting in constipation (trouble having a bowel movement) and/or stomach aches or cramps when you eat meals.  The Brain: Your brain, which controls the rest  of your bodys functions, does not work properly without food. For example, you may have trouble thinking clearly or paying attention and/or you could also feel anxious or sad.  Body Cells: The balance of electrolytes in the blood can be changed with malnutrition or with purging. Without food, the amount of potassium and phosphorous can get dangerously low which can cause problems with your muscles, changes in your brain functioning, and cause life-threatening heart and rhythm problems.  Bones: When you dont eat, your bones  often become weak due to low calcium and low hormone levels, which increases your risk of getting broken bones now and developing weak bones when youre older.  Body Temperature: Your body naturally lowers its temperature in times of starvation to conserve energy and protect vital organs. When this happens, there is a decrease in circulation (blood flow) to your fingers and toes which will often cause your hands and feet to feel cold and look bluish.  Skin: Your skin becomes dry when your body is not well hydrated and when it does not get enough vitamins and minerals from food. The skin will naturally protect your body during periods of starvation by developing fine, soft hair called lanugo that covers the skin to keep your body warm.  Hair: When your hair doesnt get enough nourishment from the vitamins and minerals that are naturally found in healthy food, it becomes dry, thin and it can even fall out.  Nails: Your nails require nutrients in the form of vitamins and minerals from your diet. When you dont eat, you deny your body what it needs and your nails become dry and brittle and break easily.  Teeth: Your teeth need vitamin D and calcium from food sources. Without both of these minerals, you can end up with dental problems such as tooth decay and gum disease. Purging can also destroy tooth enamel.   Pre-Op Goals Progress & New Goals  NEW: go to the gym the days you have off NEW Increase water consumption  Handouts Provided Include    Learning Style & Readiness for Change Teaching method utilized: Visual & Auditory  Demonstrated degree of understanding via: Teach Back  Readiness Level: pre-contemplative Barriers to learning/adherence to lifestyle change: difficulty in change  RD's Notes for next Visit  Assess pts adherence to chosen goals   MONITORING & EVALUATION Dietary intake, weekly physical activity, body weight, and pre-op goals in 1 month.   Next Steps  Patient is to  return to [facility] in 1 month

## 2021-03-29 ENCOUNTER — Ambulatory Visit: Payer: 59 | Admitting: Infectious Diseases

## 2021-04-02 ENCOUNTER — Encounter (HOSPITAL_COMMUNITY): Payer: Self-pay | Admitting: Gastroenterology

## 2021-04-02 NOTE — Anesthesia Preprocedure Evaluation (Addendum)
Anesthesia Evaluation  Patient identified by MRN, date of birth, ID band Patient awake    Reviewed: Allergy & Precautions, H&P , NPO status , Patient's Chart, lab work & pertinent test results  Airway Mallampati: III  TM Distance: >3 FB Neck ROM: Full    Dental no notable dental hx. (+) Teeth Intact, Dental Advisory Given   Pulmonary neg pulmonary ROS,    Pulmonary exam normal breath sounds clear to auscultation       Cardiovascular Exercise Tolerance: Good hypertension, Pt. on medications  Rhythm:Regular Rate:Normal     Neuro/Psych negative neurological ROS  negative psych ROS   GI/Hepatic negative GI ROS, Neg liver ROS,   Endo/Other  Morbid obesity  Renal/GU negative Renal ROS  negative genitourinary   Musculoskeletal   Abdominal   Peds  Hematology  (+) HIV,   Anesthesia Other Findings   Reproductive/Obstetrics negative OB ROS                            Anesthesia Physical Anesthesia Plan  ASA: 3  Anesthesia Plan: MAC   Post-op Pain Management: Minimal or no pain anticipated   Induction: Intravenous  PONV Risk Score and Plan: 2 and Propofol infusion and Treatment may vary due to age or medical condition  Airway Management Planned: Natural Airway and Simple Face Mask  Additional Equipment:   Intra-op Plan:   Post-operative Plan:   Informed Consent: I have reviewed the patients History and Physical, chart, labs and discussed the procedure including the risks, benefits and alternatives for the proposed anesthesia with the patient or authorized representative who has indicated his/her understanding and acceptance.     Dental advisory given  Plan Discussed with: CRNA  Anesthesia Plan Comments:        Anesthesia Quick Evaluation

## 2021-04-03 ENCOUNTER — Ambulatory Visit (HOSPITAL_COMMUNITY): Payer: Commercial Managed Care - HMO | Admitting: Anesthesiology

## 2021-04-03 ENCOUNTER — Encounter (HOSPITAL_COMMUNITY): Payer: Self-pay | Admitting: Gastroenterology

## 2021-04-03 ENCOUNTER — Ambulatory Visit (HOSPITAL_COMMUNITY)
Admission: RE | Admit: 2021-04-03 | Discharge: 2021-04-03 | Disposition: A | Discharge status: Home / Self Care | Payer: Commercial Managed Care - HMO | Source: Ambulatory Visit | Attending: Gastroenterology | Admitting: Gastroenterology

## 2021-04-03 ENCOUNTER — Other Ambulatory Visit: Payer: Self-pay

## 2021-04-03 ENCOUNTER — Encounter (HOSPITAL_COMMUNITY)
Admission: RE | Disposition: A | Discharge status: Home / Self Care | Payer: Self-pay | Source: Ambulatory Visit | Attending: Gastroenterology

## 2021-04-03 ENCOUNTER — Ambulatory Visit (HOSPITAL_BASED_OUTPATIENT_CLINIC_OR_DEPARTMENT_OTHER): Payer: Commercial Managed Care - HMO | Admitting: Anesthesiology

## 2021-04-03 DIAGNOSIS — D122 Benign neoplasm of ascending colon: Secondary | ICD-10-CM | POA: Insufficient documentation

## 2021-04-03 DIAGNOSIS — K635 Polyp of colon: Secondary | ICD-10-CM

## 2021-04-03 DIAGNOSIS — K2970 Gastritis, unspecified, without bleeding: Secondary | ICD-10-CM | POA: Diagnosis not present

## 2021-04-03 DIAGNOSIS — Z21 Asymptomatic human immunodeficiency virus [HIV] infection status: Secondary | ICD-10-CM | POA: Insufficient documentation

## 2021-04-03 DIAGNOSIS — K6389 Other specified diseases of intestine: Secondary | ICD-10-CM | POA: Diagnosis not present

## 2021-04-03 DIAGNOSIS — Z01818 Encounter for other preprocedural examination: Secondary | ICD-10-CM | POA: Insufficient documentation

## 2021-04-03 DIAGNOSIS — K2289 Other specified disease of esophagus: Secondary | ICD-10-CM | POA: Insufficient documentation

## 2021-04-03 DIAGNOSIS — Z1211 Encounter for screening for malignant neoplasm of colon: Secondary | ICD-10-CM | POA: Insufficient documentation

## 2021-04-03 DIAGNOSIS — K529 Noninfective gastroenteritis and colitis, unspecified: Secondary | ICD-10-CM | POA: Insufficient documentation

## 2021-04-03 DIAGNOSIS — K648 Other hemorrhoids: Secondary | ICD-10-CM | POA: Insufficient documentation

## 2021-04-03 DIAGNOSIS — Z6841 Body Mass Index (BMI) 40.0 and over, adult: Secondary | ICD-10-CM | POA: Diagnosis not present

## 2021-04-03 DIAGNOSIS — I10 Essential (primary) hypertension: Secondary | ICD-10-CM | POA: Insufficient documentation

## 2021-04-03 HISTORY — PX: ESOPHAGOGASTRODUODENOSCOPY (EGD) WITH PROPOFOL: SHX5813

## 2021-04-03 HISTORY — PX: COLONOSCOPY WITH PROPOFOL: SHX5780

## 2021-04-03 HISTORY — PX: BIOPSY: SHX5522

## 2021-04-03 HISTORY — PX: POLYPECTOMY: SHX5525

## 2021-04-03 SURGERY — COLONOSCOPY WITH PROPOFOL
Anesthesia: Monitor Anesthesia Care | Laterality: Bilateral

## 2021-04-03 MED ORDER — PROPOFOL 500 MG/50ML IV EMUL
INTRAVENOUS | Status: DC | PRN
  Administered 2021-04-03: 125 ug/kg/min via INTRAVENOUS

## 2021-04-03 MED ORDER — PROPOFOL 1000 MG/100ML IV EMUL
INTRAVENOUS | Status: AC
  Filled 2021-04-03: qty 100

## 2021-04-03 MED ORDER — MIDAZOLAM HCL 5 MG/5ML IJ SOLN
INTRAMUSCULAR | Status: DC | PRN
Start: 2021-04-03 — End: 2021-04-03
  Administered 2021-04-03: 2 mg via INTRAVENOUS

## 2021-04-03 MED ORDER — ONDANSETRON HCL 4 MG/2ML IJ SOLN
INTRAMUSCULAR | Status: DC | PRN
  Administered 2021-04-03: 4 mg via INTRAVENOUS

## 2021-04-03 MED ORDER — LACTATED RINGERS IV SOLN: INTRAVENOUS | Status: DC

## 2021-04-03 MED ORDER — OMEPRAZOLE MAGNESIUM 20 MG PO TBEC: 20.0000 mg | DELAYED_RELEASE_TABLET | Freq: Every day | ORAL | 1 refills | Status: AC

## 2021-04-03 MED ORDER — PHENYLEPHRINE HCL (PRESSORS) 10 MG/ML IV SOLN
INTRAVENOUS | Status: DC | PRN
  Administered 2021-04-03: 100 ug via INTRAVENOUS

## 2021-04-03 MED ORDER — GLYCOPYRROLATE 0.2 MG/ML IJ SOLN
INTRAMUSCULAR | Status: DC | PRN
  Administered 2021-04-03: .2 mg via INTRAVENOUS

## 2021-04-03 MED ORDER — PROPOFOL 500 MG/50ML IV EMUL
INTRAVENOUS | Status: DC | PRN
  Administered 2021-04-03: 30 mg via INTRAVENOUS
  Administered 2021-04-03: 50 mg via INTRAVENOUS

## 2021-04-03 MED ORDER — MIDAZOLAM HCL 2 MG/2ML IJ SOLN
INTRAMUSCULAR | Status: AC
  Filled 2021-04-03: qty 2

## 2021-04-03 MED ORDER — LIDOCAINE HCL (CARDIAC) PF 100 MG/5ML IV SOSY
PREFILLED_SYRINGE | INTRAVENOUS | Status: DC | PRN
  Administered 2021-04-03: 80 mg via INTRAVENOUS

## 2021-04-03 MED ORDER — PROPOFOL 500 MG/50ML IV EMUL
INTRAVENOUS | Status: AC
  Filled 2021-04-03: qty 50

## 2021-04-03 SURGICAL SUPPLY — 25 items

## 2021-04-03 NOTE — Op Note (Signed)
[facility] Patient Name: Courtney Chavez Procedure Date: 04/03/2021 MRN: [id number] Attending MD: Otis Brace , MD Date of Birth: [birth year] CSN: [id number] Age: 46 Admit Type: Outpatient Procedure:                Upper GI endoscopy Indications:              Preoperative assessment for bariatric surgery to                            treat morbid obesity Providers:                Otis Brace, MD, Allayne Gitelman, RN, Tyna Jaksch                            Technician Referring MD:              Medicines:                Sedation Administered by an Anesthesia Professional Complications:            No immediate complications. Estimated Blood Loss:     Estimated blood loss was minimal. Procedure:                Pre-Anesthesia Assessment:                           - Prior to the procedure, a History and Physical                            was performed, and patient medications and                            allergies were reviewed. The patient's tolerance of                            previous anesthesia was also reviewed. The risks                            and benefits of the procedure and the sedation                            options and risks were discussed with the patient.                            All questions were answered, and informed consent                            was obtained. Prior Anticoagulants: The patient has                            taken no previous anticoagulant or antiplatelet                            agents. ASA Grade Assessment: III - A patient with  severe systemic disease. After reviewing the risks                            and benefits, the patient was deemed in                            satisfactory condition to undergo the procedure.                           After obtaining informed consent, the endoscope was                            passed under direct vision. Throughout the                             procedure, the patient's blood pressure, pulse, and                            oxygen saturations were monitored continuously. The                            [id number]-H190 ([id number]) Olympus endoscope was introduced                            through the mouth, and advanced to the second part                            of duodenum. The upper GI endoscopy was                            accomplished without difficulty. The patient                            tolerated the procedure well. Scope In: Scope Out: Findings:      The Z-line was variable and was found 36 cm from the incisors.      Scattered moderate inflammation characterized by congestion (edema),       erosions, erythema and friability was found in the entire examined       stomach. Biopsies were taken with a cold forceps for histology.      The cardia and gastric fundus were normal on retroflexion.      The duodenal bulb, first portion of the duodenum and second portion of       the duodenum were normal. Impression:               - Z-line variable, 36 cm from the incisors.                           - Gastritis. Biopsied.                           - Normal duodenal bulb, first portion of the                            duodenum and second portion of the  duodenum. Moderate Sedation:      Moderate (conscious) sedation was personally administered by an       anesthesia professional. The following parameters were monitored: oxygen       saturation, heart rate, blood pressure, and response to care. Recommendation:           - Patient has a contact number available for                            emergencies. The signs and symptoms of potential                            delayed complications were discussed with the                            patient. Return to normal activities tomorrow.                            Written discharge instructions were provided to the                            patient.                           -  Resume previous diet.                           - Continue present medications.                           - Await pathology results.                           - Use Prilosec (omeprazole) 20 mg PO daily for 2                            months. Procedure Code(s):        --- Professional ---                           [phone], Esophagogastroduodenoscopy, flexible,                            transoral; with biopsy, single or multiple Diagnosis Code(s):        --- Professional ---                           K22.8, Other specified diseases of esophagus                           K29.70, Gastritis, unspecified, without bleeding                           Z01.818, Encounter for other preprocedural                            examination  E66.01, Morbid (severe) obesity due to excess                            calories CPT copyright 2019 American Medical Association. All rights reserved. The codes documented in this report are preliminary and upon coder review may  be revised to meet current compliance requirements. Otis Brace, MD Otis Brace, MD 04/03/2021 9:21:12 AM Number of Addenda: 0

## 2021-04-03 NOTE — H&P (Signed)
Primary Care Physician:  Pcp, No Primary Gastroenterologist:  Dr. Alessandra Bevels  Reason for Visit : Outpatient EGD and colonoscopy  HPI: Courtney Chavez is a 46 y.o. female referred to GI for EGD and colonoscopy prior to bariatric surgery. Patient with past medical history of morbid obesity with BMI of around 50  and history of arthritis. She takes NSAIDs as needed. She denies any GI symptoms.        No previous EGD or colonoscopy.        Records from Epic reviewed. Blood work in May 2022 showed normal CBC and CMP.        No family history of colon cancer.  Past Medical History:  Diagnosis Date   HIV (human immunodeficiency virus infection) ([place])    HIV infection ([place])    Hypertension     Past Surgical History:  Procedure Laterality Date   KNEE ARTHROSCOPY WITH MEDIAL MENISECTOMY Left 11/14/2020   Procedure: left knee partial lateral meniscectomy;  Surgeon: Leandrew Koyanagi, MD;  Location: [place];  Service: Orthopedics;  Laterality: Left;   NO PAST SURGERIES      Prior to Admission medications   Medication Sig Start Date End Date Taking? Authorizing Provider  acetaminophen (TYLENOL) 500 MG tablet Take 500-1,000 mg by mouth every 6 (six) hours as needed (for pain.).   Yes [provider]  BIKTARVY 50-200-25 MG TABS tablet TAKE 1 TABLET BY MOUTH DAILY Patient taking differently: 1 tablet every evening. 11/29/20  Yes Campbell Riches, MD  hydrochlorothiazide (HYDRODIURIL) 25 MG tablet TAKE 1/2 TABLET(12.5 MG) BY MOUTH DAILY Patient taking differently: Take 12.5 mg by mouth every evening. TAKE 1/2 TABLET(12.5 MG) BY MOUTH DAILY 10/26/20  Yes Campbell Riches, MD  ibuprofen (ADVIL) 600 MG tablet Take 1 tablet (600 mg total) by mouth every 6 (six) hours. 09/02/18  Yes Autry-Lott, Naaman Plummer, DO  lisinopril (ZESTRIL) 20 MG tablet TAKE 1 TABLET(20 MG) BY MOUTH DAILY Patient taking differently: Take 20 mg by mouth every evening. TAKE 1 TABLET(20 MG) BY MOUTH DAILY 10/26/20   Yes Campbell Riches, MD  aspirin EC 81 MG tablet Take 1 tablet (81 mg total) by mouth daily. 05/05/18   Donnamae Jude, MD  HYDROcodone-acetaminophen (NORCO) 5-325 MG tablet Take 1 tablet by mouth every 6 (six) hours as needed. Patient not taking: Reported on 03/29/2021 11/14/20   Leandrew Koyanagi, MD  meloxicam (MOBIC) 15 MG tablet Take 1 tablet (15 mg total) by mouth daily. Take with food.  Stop when your pain has completely resolved Patient not taking: Reported on 03/29/2021 01/17/21   Rita Ohara, MD    Scheduled Meds: Continuous Infusions:  lactated ringers 10 mL/hr at 04/03/21 0816   PRN Meds:.  Allergies as of 01/22/2021   (No Known Allergies)    Family History  Problem Relation Age of Onset   Hypertension Mother    Hypertension Father    Hypertension Brother     Social History   Socioeconomic History   Marital status: Single    Spouse name: Not on file   Number of children: Not on file   Years of education: Not on file   Highest education level: Not on file  Occupational History    Comment: unemployed  Tobacco Use   Smoking status: Never   Smokeless tobacco: Never  Vaping Use   Vaping Use: Never used  Substance and Sexual Activity   Alcohol use: Not Currently    Comment: rarely   Drug  use: No   Sexual activity: Not Currently    Partners: Male    Birth control/protection: Condom    Comment: pt. declined condoms  Other Topics Concern   Not on file  Social History Narrative   Not on file   Social Determinants of Health   Financial Resource Strain: Not on file  Food Insecurity: Not on file  Transportation Needs: Not on file  Physical Activity: Not on file  Stress: Not on file  Social Connections: Not on file  Intimate Partner Violence: Not on file    Review of Systems: All negative except as stated above in HPI.  Physical Exam: Vital signs: Vitals:   04/03/21 0723  BP: (!) 151/86  Pulse: 84  Resp: (!) 24  Temp: 98.5 F (36.9 C)  SpO2: 98%      General:   Morbidly obese, not in acute distress Lungs:  Clear throughout to auscultation.  Anterior exam only Heart:  Regular rate and rhythm; no murmurs, clicks, rubs,  or gallops. Abdomen: Soft, nontender, nondistended, bowel sounds present.  No peritoneal signs Rectal:  Deferred  GI:  Lab Results: No results for input(s): WBC, HGB, HCT, PLT in the last 72 hours. BMET No results for input(s): NA, K, CL, CO2, GLUCOSE, BUN, CREATININE, CALCIUM in the last 72 hours. LFT No results for input(s): PROT, ALBUMIN, AST, ALT, ALKPHOS, BILITOT, BILIDIR, IBILI in the last 72 hours. PT/INR No results for input(s): LABPROT, INR in the last 72 hours.   Studies/Results: No results found.  Impression/Plan: -Colon cancer screening -Morbid obesity -Preop evaluation prior to bariatric surgery  Recommendations ------------------------ -Proceed with EGD and colonoscopy today.  Risks (bleeding, infection, bowel perforation that could require surgery, sedation-related changes in cardiopulmonary systems), benefits (identification and possible treatment of source of symptoms, exclusion of certain causes of symptoms), and alternatives (watchful waiting, radiographic imaging studies, empiric medical treatment)  were explained to patient/family in detail and patient wishes to proceed.     LOS: 0 days   Otis Brace  MD, FACP 04/03/2021, 8:24 AM  Contact #  [phone]

## 2021-04-03 NOTE — Discharge Instructions (Addendum)

## 2021-04-03 NOTE — Anesthesia Postprocedure Evaluation (Signed)
Anesthesia Post Note  Patient: Courtney Chavez  Procedure(s) Performed: COLONOSCOPY WITH PROPOFOL (Bilateral) ESOPHAGOGASTRODUODENOSCOPY (EGD) WITH PROPOFOL (Bilateral) POLYPECTOMY BIOPSY     Patient location during evaluation: Endoscopy Anesthesia Type: MAC Level of consciousness: awake and alert Pain management: pain level controlled Vital Signs Assessment: post-procedure vital signs reviewed and stable Respiratory status: spontaneous breathing, nonlabored ventilation and respiratory function stable Cardiovascular status: stable and blood pressure returned to baseline Postop Assessment: no apparent nausea or vomiting Anesthetic complications: no   No notable events documented.  Last Vitals:  Vitals:   04/03/21 0920 04/03/21 0935  BP: 119/73 (!) 134/92  Pulse: 83 83  Resp: (!) 21 18  Temp:    SpO2: 100% 99%    Last Pain:  Vitals:   04/03/21 0935  TempSrc:   PainSc: 0-No pain                 Ardeth Repetto,W. EDMOND

## 2021-04-03 NOTE — Op Note (Signed)
[facility] Patient Name: Courtney Chavez Procedure Date: 04/03/2021 MRN: [id number] Attending MD: Otis Brace , MD Date of Birth: February 10, 1976 CSN: [id number] Age: 46 Admit Type: Outpatient Procedure:                Colonoscopy Indications:              Screening for colorectal malignant neoplasm, This                            is the patient's first colonoscopy Providers:                Otis Brace, MD, Allayne Gitelman, RN, Tyna Jaksch                            Technician Referring MD:              Medicines:                Sedation Administered by an Anesthesia Professional Complications:            No immediate complications. Estimated Blood Loss:     Estimated blood loss was minimal. Procedure:                Pre-Anesthesia Assessment:                           - Prior to the procedure, a History and Physical                            was performed, and patient medications and                            allergies were reviewed. The patient's tolerance of                            previous anesthesia was also reviewed. The risks                            and benefits of the procedure and the sedation                            options and risks were discussed with the patient.                            All questions were answered, and informed consent                            was obtained. Prior Anticoagulants: The patient has                            taken no previous anticoagulant or antiplatelet                            agents. ASA Grade Assessment: III - A patient with  severe systemic disease. After reviewing the risks                            and benefits, the patient was deemed in                            satisfactory condition to undergo the procedure.                           After obtaining informed consent, the colonoscope                            was passed under direct vision. Throughout the                             procedure, the patient's blood pressure, pulse, and                            oxygen saturations were monitored continuously. The                            [id number]-HQ190L ([id number] Olympus colonoscope was                            introduced through the anus and advanced to the the                            cecum, identified by appendiceal orifice and                            ileocecal valve. The colonoscopy was performed                            without difficulty. The patient tolerated the                            procedure well. The quality of the bowel                            preparation was good except the sigmoid colon was                            fair. Scope In: 8:49:08 AM Scope Out: 9:06:25 AM Scope Withdrawal Time: 0 hours 11 minutes 22 seconds  Total Procedure Duration: 0 hours 17 minutes 17 seconds  Findings:      The perianal and digital rectal examinations were normal.      A 2 mm polyp was found in the proximal ascending colon. The polyp was       sessile. The polyp was removed with a cold biopsy forceps. Resection and       retrieval were complete.      A localized area of mildly congested, erythematous and inflamed mucosa       was found in the distal sigmoid colon. Biopsies were taken with a cold  forceps for histology.      Internal hemorrhoids were found during retroflexion. The hemorrhoids       were small. Impression:               - One 2 mm polyp in the proximal ascending colon,                            removed with a cold biopsy forceps. Resected and                            retrieved.                           - Congested, erythematous and inflamed mucosa in                            the distal sigmoid colon. Biopsied.                           - Internal hemorrhoids. Moderate Sedation:      Moderate (conscious) sedation was personally administered by an       anesthesia professional. The following parameters were  monitored: oxygen       saturation, heart rate, blood pressure, and response to care. Recommendation:           - Patient has a contact number available for                            emergencies. The signs and symptoms of potential                            delayed complications were discussed with the                            patient. Return to normal activities tomorrow.                            Written discharge instructions were provided to the                            patient.                           - Resume previous diet.                           - Continue present medications.                           - Await pathology results.                           - Repeat colonoscopy in 5 years for surveillance                            based on pathology results.                           -  Return to my office PRN. Procedure Code(s):        --- Professional ---                           [phone], Colonoscopy, flexible; with biopsy, single                            or multiple Diagnosis Code(s):        --- Professional ---                           Z12.11, Encounter for screening for malignant                            neoplasm of colon                           K63.5, Polyp of colon                           K63.89, Other specified diseases of intestine                           K52.9, Noninfective gastroenteritis and colitis,                            unspecified                           K64.8, Other hemorrhoids CPT copyright 2019 American Medical Association. All rights reserved. The codes documented in this report are preliminary and upon coder review may  be revised to meet current compliance requirements. Otis Brace, MD Otis Brace, MD 04/03/2021 9:24:28 AM Number of Addenda: 0

## 2021-04-03 NOTE — Transfer of Care (Signed)
Immediate Anesthesia Transfer of Care Note  Patient: Ronella Guandique  Procedure(s) Performed: Procedure(s): COLONOSCOPY WITH PROPOFOL (Bilateral) ESOPHAGOGASTRODUODENOSCOPY (EGD) WITH PROPOFOL (Bilateral) POLYPECTOMY BIOPSY  Patient Location: PACU  Anesthesia Type:MAC  Level of Consciousness:  sedated, patient cooperative and responds to stimulation  Airway & Oxygen Therapy:Patient Spontanous Breathing and Patient connected to face mask oxgen  Post-op Assessment:  Report given to PACU RN and Post -op Vital signs reviewed and stable  Post vital signs:  Reviewed and stable  Last Vitals:  Vitals:   04/03/21 0723  BP: (!) 151/86  Pulse: 84  Resp: (!) 24  Temp: 36.9 C  SpO2: 16%    Complications: No apparent anesthesia complications

## 2021-04-04 ENCOUNTER — Encounter (HOSPITAL_COMMUNITY): Payer: Self-pay | Admitting: Gastroenterology

## 2021-04-04 LAB — SURGICAL PATHOLOGY

## 2021-04-19 ENCOUNTER — Ambulatory Visit (INDEPENDENT_AMBULATORY_CARE_PROVIDER_SITE_OTHER): Payer: Managed Care, Other (non HMO) | Admitting: Infectious Diseases

## 2021-04-19 ENCOUNTER — Other Ambulatory Visit: Payer: Self-pay

## 2021-04-19 ENCOUNTER — Ambulatory Visit (INDEPENDENT_AMBULATORY_CARE_PROVIDER_SITE_OTHER): Payer: Managed Care, Other (non HMO)

## 2021-04-19 ENCOUNTER — Encounter: Payer: Self-pay | Admitting: Infectious Diseases

## 2021-04-19 VITALS — BP 129/84 | HR 82 | Temp 98.3°F | Ht 62.0 in | Wt 276.0 lb

## 2021-04-19 DIAGNOSIS — E785 Hyperlipidemia, unspecified: Secondary | ICD-10-CM

## 2021-04-19 DIAGNOSIS — Z79899 Other long term (current) drug therapy: Secondary | ICD-10-CM

## 2021-04-19 DIAGNOSIS — Z113 Encounter for screening for infections with a predominantly sexual mode of transmission: Secondary | ICD-10-CM | POA: Diagnosis not present

## 2021-04-19 DIAGNOSIS — B20 Human immunodeficiency virus [HIV] disease: Secondary | ICD-10-CM | POA: Diagnosis not present

## 2021-04-19 DIAGNOSIS — Z6841 Body Mass Index (BMI) 40.0 and over, adult: Secondary | ICD-10-CM

## 2021-04-19 DIAGNOSIS — I10 Essential (primary) hypertension: Secondary | ICD-10-CM

## 2021-04-19 DIAGNOSIS — Z23 Encounter for immunization: Secondary | ICD-10-CM

## 2021-04-19 NOTE — Addendum Note (Signed)
Addended by: Lin Landsman E on: 04/19/2021 12:29 PM ? ? Modules accepted: Orders ? ?

## 2021-04-19 NOTE — Assessment & Plan Note (Addendum)
Will continue her on biktarvy ?Check her labs today ?She refuses condoms.  ?Check mammo/pap- has to wait > 1 month after COVID vax. ?COVID vax today ?PCV 2025 ?rtc in 9 months ?

## 2021-04-19 NOTE — Assessment & Plan Note (Addendum)
She is doing well, asx and normotensive today.  ?On low dose HCTZ.  ?

## 2021-04-19 NOTE — Progress Notes (Signed)
? ?  Covid-19 Vaccination Clinic ? ?Name:  Courtney Chavez    ?MRN: [id number] ?DOB: 09/29/1975 ? ?04/19/2021 ? ?[name] was observed post Covid-19 immunization for 15 minutes without incident. She was provided with Vaccine Information Sheet and instruction to access the V-Safe system.  ? ?[name] was instructed to call 911 with any severe reactions post vaccine: ?Difficulty breathing  ?Swelling of face and throat  ?A fast heartbeat  ?A bad rash all over body  ?Dizziness and weakness  ? ?  Binnie Kand, RN  ?

## 2021-04-19 NOTE — Assessment & Plan Note (Signed)
She is going to have gastric sleeve ?We spoke about ozempic but she would prefer surgery.  ?My great appreciation to bariatric clinic.  ?
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
## 2021-04-19 NOTE — Assessment & Plan Note (Signed)
Will repeat her lipids today ?

## 2021-04-22 ENCOUNTER — Other Ambulatory Visit: Payer: Self-pay

## 2021-04-22 ENCOUNTER — Encounter: Payer: Managed Care, Other (non HMO) | Attending: Surgery | Admitting: Skilled Nursing Facility1

## 2021-04-22 DIAGNOSIS — E669 Obesity, unspecified: Secondary | ICD-10-CM | POA: Insufficient documentation

## 2021-04-22 LAB — T-HELPER CELLS (CD4) COUNT (NOT AT ARMC)
Absolute CD4: 932 cells/uL (ref 490–1740)
CD4 T Helper %: 41 % (ref 30–61)
Total lymphocyte count: 2260 cells/uL (ref 850–3900)

## 2021-04-22 LAB — LIPID PANEL
Cholesterol: 239 mg/dL — ABNORMAL HIGH (ref <200)
HDL: 44 mg/dL — ABNORMAL LOW (ref >=50)
LDL Cholesterol (Calc): 157 mg/dL (calc) — ABNORMAL HIGH
Non-HDL Cholesterol (Calc): 195 mg/dL (calc) — ABNORMAL HIGH (ref <130)
Total CHOL/HDL Ratio: 5.4 (calc) — ABNORMAL HIGH (ref <5.0)
Triglycerides: 224 mg/dL — ABNORMAL HIGH (ref <150)

## 2021-04-22 LAB — CBC
HCT: 40.2 % (ref 35.0–45.0)
Hemoglobin: 13 g/dL (ref 11.7–15.5)
MCH: 28.7 pg (ref 27.0–33.0)
MCHC: 32.3 g/dL (ref 32.0–36.0)
MCV: 88.7 fL (ref 80.0–100.0)
MPV: 11.8 fL (ref 7.5–12.5)
Platelets: 219 10*3/uL (ref 140–400)
RBC: 4.53 10*6/uL (ref 3.80–5.10)
RDW: 13 % (ref 11.0–15.0)
WBC: 6 10*3/uL (ref 3.8–10.8)

## 2021-04-22 LAB — COMPREHENSIVE METABOLIC PANEL
AG Ratio: 1.4 (calc) (ref 1.0–2.5)
ALT: 15 U/L (ref 6–29)
AST: 15 U/L (ref 10–35)
Albumin: 4.2 g/dL (ref 3.6–5.1)
Alkaline phosphatase (APISO): 63 U/L (ref 31–125)
BUN: 18 mg/dL (ref 7–25)
CO2: 30 mmol/L (ref 20–32)
Calcium: 9.9 mg/dL (ref 8.6–10.2)
Chloride: 100 mmol/L (ref 98–110)
Creat: 0.92 mg/dL (ref 0.50–0.99)
Globulin: 3.1 g/dL (calc) (ref 1.9–3.7)
Glucose, Bld: 164 mg/dL — ABNORMAL HIGH (ref 65–99)
Potassium: 3.6 mmol/L (ref 3.5–5.3)
Sodium: 139 mmol/L (ref 135–146)
Total Bilirubin: 0.3 mg/dL (ref 0.2–1.2)
Total Protein: 7.3 g/dL (ref 6.1–8.1)

## 2021-04-22 LAB — HEMOGLOBIN A1C
Hgb A1c MFr Bld: 7.5 % of total Hgb — ABNORMAL HIGH (ref <5.7)
Mean Plasma Glucose: 169 mg/dL
eAG (mmol/L): 9.3 mmol/L

## 2021-04-22 LAB — RPR: RPR Ser Ql: NONREACTIVE

## 2021-04-22 LAB — C. TRACHOMATIS/N. GONORRHOEAE RNA
C. trachomatis RNA, TMA: NOT DETECTED
N. gonorrhoeae RNA, TMA: NOT DETECTED

## 2021-04-22 LAB — HIV-1 RNA QUANT-NO REFLEX-BLD
HIV 1 RNA Quant: NOT DETECTED Copies/mL
HIV-1 RNA Quant, Log: NOT DETECTED Log cps/mL

## 2021-04-22 NOTE — Progress Notes (Signed)
Supervised Weight Loss Visit ?Bariatric Nutrition Education ? ?Planned Surgery: sleeve gastrectomy   ? ? ?2 out of 6 SWL Appointments  ? ?NUTRITION ASSESSMENT ? ?Anthropometrics  ?Start weight at [facility]: 273.2 lbs (date: 02/21/2021)  ?Weight: 274 pounds ?BMI: 50.12 kg/m2   ?  ?Clinical  ?Medical hx: HTN ?Medications: see list  ?Labs: glucose 143, cholesterol 218, LDL 153, a1C 7.1 ?Notable signs/symptoms: none reported including hypo or hyperglycemia  ?Any previous deficiencies? No ? ?Lifestyle & Dietary Hx ? ? ?Pt states she works third shift.  ?Pt states she does not eat beef or pork. ?Pt states she works a part time job 3 days a week 5pm-9pm; then 11pm-7am 4-6 days a week. Pt states she usually goes to sleep around 9am and wakes around 3-3:30 pm ? ? ?Pt states she tried to work out but her knee started to hurt so she did not want to push it (pt was doing leg presses).  ?Pt states she did cut back on hot chocolate.  ?Pt states she cut back on the amount of rice and has cut back on red meat.  ?Pt sates she is going to do: Can eat after 7pm only yogurt and fruit and maybe a protein shake.  ? ?Estimated daily fluid intake:  oz ?Supplements:  ?Current average weekly physical activity: ADL's ? ?24-Hr Dietary Recall: woke up around 1pm ?First Meal :  ?Snack:  ?Second Meal 5pm: (homemade)  taco: cubed meat + cabbage + cheese + chili + sour cream ?Snack:  ?Third Meal 9am: egg + bacon + toast ?Snack: sometimes before bed (in the morning) 2 toast +honey butter + hot chocolate + milk  ?Beverages: water, green tea, ginger tea  ? ?Estimated Energy Needs ?Calories: 1500 ? ? ?NUTRITION DIAGNOSIS  ?Overweight/obesity ([place]-3.3) related to past poor dietary habits and physical inactivity as evidenced by patient w/ planned sleeve gastrectomy surgery following dietary guidelines for continued weight loss. ? ? ?NUTRITION INTERVENTION  ?Nutrition counseling (C-1) and education (E-2) to facilitate bariatric surgery goals. ? ? ?Pre-Op Goals  Progress & New Goals ? ?continue: go to the gym the days you have off ?Continue: Increase water consumption ?NEW: focus on the treadmill and the upper body for your workouts  ?NEW: smaller portions ? ?Handouts Provided Include  ? ? ?Learning Style & Readiness for Change ?Teaching method utilized: Visual & Auditory  ?Demonstrated degree of understanding via: Teach Back  ?Readiness Level: pre-contemplative ?Barriers to learning/adherence to lifestyle change: difficulty in change ? ?RD's Notes for next Visit  ?Assess pts adherence to chosen goals ? ? ?MONITORING & EVALUATION ?Dietary intake, weekly physical activity, body weight, and pre-op goals in 1 month.  ? ?Next Steps  ?Patient is to return to [facility] in 1 month ?

## 2021-04-30 ENCOUNTER — Ambulatory Visit: Payer: Managed Care, Other (non HMO) | Admitting: Infectious Diseases

## 2021-05-13 ENCOUNTER — Other Ambulatory Visit: Payer: Self-pay

## 2021-05-13 ENCOUNTER — Ambulatory Visit (INDEPENDENT_AMBULATORY_CARE_PROVIDER_SITE_OTHER): Payer: Managed Care, Other (non HMO) | Admitting: Infectious Diseases

## 2021-05-13 ENCOUNTER — Other Ambulatory Visit (HOSPITAL_COMMUNITY)
Admission: RE | Admit: 2021-05-13 | Discharge: 2021-05-13 | Disposition: A | Discharge status: Home / Self Care | Payer: Managed Care, Other (non HMO) | Source: Ambulatory Visit | Attending: Infectious Diseases | Admitting: Infectious Diseases

## 2021-05-13 ENCOUNTER — Encounter: Payer: Self-pay | Admitting: Infectious Diseases

## 2021-05-13 VITALS — BP 165/115 | HR 98 | Temp 98.1°F

## 2021-05-13 DIAGNOSIS — Z113 Encounter for screening for infections with a predominantly sexual mode of transmission: Secondary | ICD-10-CM | POA: Diagnosis not present

## 2021-05-13 DIAGNOSIS — Z124 Encounter for screening for malignant neoplasm of cervix: Secondary | ICD-10-CM | POA: Insufficient documentation

## 2021-05-13 NOTE — Patient Instructions (Addendum)
Please schedule your 9 month follow up appointment with Dr. Johnnye Sima on your way out. ?I will let you know what your pap smear results show once they are back.   ?

## 2021-05-13 NOTE — Assessment & Plan Note (Addendum)
Normal pelvic exam. ?Cervical brushing collected for cytology and HPV.  ?Discussed recommended screening interval for women living with HIV disease meant to be lifelong and at an interval of Q1-3 years pending results. Acceptable to space out Q3y with 3 consecutively normal exams. Further recommendations for Courtney Ch[place] to follow today's results.  ?Results will be communicated to the patient via mychart message.  ? ?
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
## 2021-05-16 LAB — CYTOLOGY - PAP
Chlamydia: NEGATIVE
Comment: NEGATIVE
Comment: NEGATIVE
Comment: NORMAL
Diagnosis: NEGATIVE
High risk HPV: NEGATIVE
Neisseria Gonorrhea: NEGATIVE

## 2021-05-23 ENCOUNTER — Ambulatory Visit: Payer: Managed Care, Other (non HMO) | Admitting: Skilled Nursing Facility1

## 2021-07-11 ENCOUNTER — Ambulatory Visit (INDEPENDENT_AMBULATORY_CARE_PROVIDER_SITE_OTHER): Payer: Commercial Managed Care - HMO | Admitting: Psychology

## 2021-07-11 ENCOUNTER — Encounter: Payer: Self-pay | Admitting: Infectious Diseases

## 2021-07-11 DIAGNOSIS — F509 Eating disorder, unspecified: Secondary | ICD-10-CM | POA: Diagnosis not present

## 2021-07-11 NOTE — Progress Notes (Signed)
Courtney Ch[place] participated in the session via  video from home and consented to treatment. We met online due to COVID-19 pandemic. Identify was confirmed via three factors. Therapist participated from home office.The patient completed the psychiatric diagnostic evaluation (complete history, including past, family, and social history, psychiatric history, and establishment of a tentative diagnosis) and the bariatric assessment for a total of    76       minutes and code [id number] was billed.   Additionally, during this initial appointment the clinician completed the scoring of the following measures:  Beck Anxiety Inventory (BAI) (5 minutes), Beck's Depression Index (5 minutes), Eating Disorder Diagnostic Scale (EDDS) (5 minutes), Mental Status Examination (MSE) (10 minutes [administration and scoring]), & Mood Disorder Questionnaire (MDQ) (5 minutes [scoring) A total of 30 minutes was spent on the scoring of the aforementioned measures and code [id number] was billed.   Please see the bariatric assessment, under documents, for additional details. The patient was scheduled for a feedback session. Clinician completed the written report which includes integration of patient data, interpretation of standardized test results, interpretation of clinical data, review of referral from surgeon & clinical decision making (1 hour in total).   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

## 2021-07-12 NOTE — Progress Notes (Signed)
Bariatric Evaluation    CONFIDENTIAL    Client Name: Courtney Chavez                                                             MRN:  [id number] Date of Birth: September 16, 1975                                                               Date of Evaluation: 07/11/21 Total Assessment Time: 21 Minutes                                                     Date of Report: 07/11/21 Evaluator: Buena Irish, MSW, LCSW                                            Date of Follow-up:  Pending Referring Physician: Dr. Thermon Leyland, [facility]   Sources of Information Background Interview Bariatric Questionnaire [place] Interview for Gastric Bypass Beck Anxiety Inventory (BAI) Beck Depression Inventory, 2nd Edition (BDI-II) Eating Disorder Diagnostic Scale (EDDS) Mental Status Examination Mood Disorder Questionnaire (MDQ) Review of Medical Record (provided by [facility])  Patient Identification and Chief Complaint: Courtney Chavez was born and raised in [place] and moved to [place] in 2001. Courtney Chavez lives her family, four children, and is single parent. Courtney Chavez's parents are deceased. She has some family nearby, specifically her brother and sister in-law. Courtney Chavez family is aware and supportive of her pursuit of bariatric surgery. Courtney Chavez's sister in-law has a history of bariatric surgery. There is a family history of hypertension and diabetes mellitus. There is no reported family history of mental health or substance use concerns.   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 [name] has a high school degree.   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   Current medications include Aspirin 16m, Biktarvy 50-200-244m Hydrocodone-acetaminophen 5-325, and Lisinopril 2010mPlease see chart for most up-to-date medications. She has history of knee surgery (left). Leveta  denied any caffeine, alcohol, tobacco, or substance use.    Patient's Understanding of Courtney Procedure: Reasons for seeking bariatric surgery (perception of weight problem/motivation to lose weight): Understanding of surgical procedure/how well researched/awareness of risks involved:  [name] does not have an understanding of Courtney surgical process, dietary regimen and its varying stages, or Courtney risks associated with Courtney surgery. She would benefit from an in-depth review of Courtney aforementioned areas with her surgeon and registered dietician. This will be a requirement for approval for surgery.   History of Weight Gains and Losses: Current weight, height, desired weight: 5'2", 280lbs, ~150lbs.  Rate current activity level from 1 (sedentary) to 10 (very active): 7 Walking (5k per work day day) Methods of exercise: walking Eating Disorder  History (binging, purging, laxatives): History of dieting and results:  2019: Intermittent fasting + gym 3x per week, Lost 35lbs in 2 months, maintained for 1 year.  2020: Intermittent fasting, lost 10lbs, maintained for 3 months.  March 2023 - May 2023:  Monitored diet with RD, portion control, no weight change.   Diet and Plans for Courtney Future: Current diet/typical meals:   Breakfast 2 eggs  [address] Noodles, [place], [place].   Snacks Green apple or grapes.    Plans for exercise post-surgery: Go to Gym.  How pt copes with stress: Alone time, prayer, talk to friend and family.  Other planned changes for Courtney future: Improve overall health and fitness.   Mental Status Examination:  Courtney Chavez  was early to Courtney session and completed all Courtney necessary paperwork. She was dressed casually and her hygiene was observed to be good. She was oriented to person, place, and time. Her attitude was cooperative and cheerful. There were no unusual psychomotor movements or changes. Speech patterns were normal in rate, tone, volume, and without  pressure. Affect was reactive and mood congruent. Thought processes were goal-directed and logical. Memory and concentration for both long and short-term were intact. Insight and judgement were good overall.   Intellectual and Psychological Functioning [name]  very warm and friendly. She presented with no significant formal thought disorder. Prior to Courtney session, Courtney Chavez  completed Courtney Mood Disorder Questionnaire (MDQ) - with no affirmatives for symptoms of Bipolar. 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 Additionally, Courtney Chavez  completed Courtney [place] Interview for Gastric Bypass during Courtney session which did not identify any symptoms or behaviors of possible disordered eating.  However, it should be noted that Courtney Chavez did endorse a recent and reoccurring history of fasting as a compensatory behavior. In-depth psycho-education regarding this was provided during Courtney session including Courtney risks associated with such behavior, post-surgery, including severe medical complications. Courtney Chavez expressed her understanding and her commitment to discontinue this behavior going forward.   Conclusions & Recommendations Courtney Chavez  is a 46 year-old African female who was referred to Courtney [place] division by Dr. Thermon Leyland at [facility] for a psychological evaluation to determine her suitability for bariatric surgery.   With regard to bariatric surgery, Courtney Chavez  appears to be highly motivated and has a solid understanding of Courtney bariatric surgery, as well as risks and lifestyle changes needed to promote post-surgical weight loss  success. Results of this evaluation yielded a past history of no reported mental health concerns. At this time, Courtney Chavez  appears to be able to make an informed decision about Courtney surgery she is contemplating. However, she will be required to receive in-depth psycho-education regarding Courtney surgery and Courtney associated risks, along with Courtney varying stages if Courtney dietary regimen, by her surgeon and registered dietician, respectively. Approval for surgery will be incumbent upon Courtney completion of Courtney aforementioned steps.  Courtney patient appears to be motivated and expressed understanding of Courtney post-surgical requirements. If her surgery is scheduled more than 3 months from today's date (Pending) she is required to schedule a follow-up visit in [place] for this examiner  to briefly re-evaluate her psychological status at that time. This follow-up visit should occur within three months of her scheduled surgery date.  Courtney Ch[place] to promote Choose an item. Parveen Venturini 's health and well-being:  Devona does not have an understanding of Courtney surgical process, dietary regimen and it's varying stages, or Courtney risks associated with Courtney surgery. She would benefit from an in-depth review of Courtney aforementioned areas with her surgeon and registered dietician. This will be a requirement for approval for surgery.   It is recommended that Courtney patient participate in educational sessions regarding a healthy diet and post-operative meal planning with a dietician or other health care providers.   Re-evaluation. If Ms. Eunique Outen 's surgery is scheduled more than three months from her date of approval, she is required to contact our office [phone]) for a brief check-in appointment about a month prior to her surgery date.   Nutritional Counseling. Ms. Daina Cara  is strongly encouraged to continue attending nutritional counseling appointments in [place] to plan a  healthy diet and post-operative meals. It should be noted that Ms. Rosalina Macari 's daily diet includes foods moderate proteins and higher carbohydrates she is encouraged to make recommended changes to her diet prior to surgery in [place] to increase Courtney chances of continuing a healthy diet after surgery.   Exercise. Ms. Fredi Hurtado  is encouraged to participate in educational sessions on exercise that will be appropriate for her medical conditions and support her weight loss plans in a safe and healthy manner. Specifically, she is encouraged to consider participating in Courtney Bariatric Exercise and Lifestyle Transformation (BELT) program, a partnership between [place]. There, you'll join fellow [facility] bariatric patients three times a week for personalized aerobic and strength training instruction, as well as educational sessions on diet, exercise and behavior modification strategies. BELT meets at [place] at [employer].   Support groups. Courtney patient is encouraged to join a support group to give him/her encouragement as s/he faces Courtney psychological adjustments of bariatric surgery and Courtney need to significantly adjust one's meals and food choices. A list of support groups offered through [facility] can be found through Courtney bariatrics department website: [facility]Meds.[employer]    Self-help resources.  To develop strategies for managing emotional difficulties encountered before and after weight loss surgery, Courtney patient is encouraged to read Courtney Emotional First + Aid Kit: A Practical Guide to Life After Bariatric Surgery, Second Edition by Caren Griffins L. Sheppard Coil, PsyD. Examples of strategies discussed in this book include relieving stress without using food, developing and maintaining an exercise program, preventing relapse, etc. Ms. Florina Glas  is strongly encouraged to  practice mindful eating, Courtney goal of which is to pay close attention to Courtney smell, sight, taste, temperature, texture, etc. of food. Eating mindfully helps to eat slower while enjoying food more fully. Useful books on mindful eating include Savor: Mindful Eating, Mindful Life and How to Eat, both by mindfulness expert Thich Nhat Hanh.  Mental health treatment. For additional support either prior to or after Courtney surgery, Ms. Livie Dorff  may consider seeking Courtney care of a therapist (counselor, psychologist) in [place] to develop skills for coping with Courtney adjustment to a new lifestyle. Available therapists include this examiner, as well as other clinicians within [facility] [phone]). she may also seek other in-network providers in Courtney community by searching online at [employer]ingSpr[employer].pl.   General  recommendations for bariatric surgery: Replace Courtney habit of late night snacking with something else (e.g., chewing gum, drinking water, or a relaxing activity like reading or crosswords that occupies your hands) and consider going to bed earlier.  Practice eating 4-6 meals per day. Each meal should last about 20 minutes. Practice drinking liquids 30 minutes before or after meals. Keep a food diary for 1 week. Record all foods eaten during Courtney day, including snacks and drinks. Be very specific and very honest.  Get into Courtney habit of reading food labels to evaluate content of protein, sugars, carbohydrates, sodium, etc.  Continue to eat lots of vegetables.  Prepare meals at home, rather than take-out or fast food.  Take multivitamins including zinc and iron.  Develop exercise plans, including a low-impact and safe exercise plan to start 4-5 weeks into recovery, and a more intensive exercise plan for later.  Determine who will take care of any major responsibilities (particularly those involving physical activity, such as childcare) in Courtney early stages of your recovery.   Educate family and friends who will be involved in your recovery about Courtney extent and importance of your new lifestyle changes. Courtney more they know, Courtney better they can support you and help you stay on track!                                                             07/11/2021                                                        __________________________________    ______________________ Buena Irish, MSW, LCSW      Date Licensed Clinical Social Worker [facility] [phone]

## 2021-07-22 ENCOUNTER — Ambulatory Visit (INDEPENDENT_AMBULATORY_CARE_PROVIDER_SITE_OTHER): Payer: Self-pay | Admitting: Psychology

## 2021-07-22 DIAGNOSIS — F509 Eating disorder, unspecified: Secondary | ICD-10-CM

## 2021-07-22 NOTE — Progress Notes (Addendum)
Bariatric Evaluation    CONFIDENTIAL    Client Name: Courtney Chavez                       MRN:  [id number] Date of Birth: 1975-04-09                                                   Date of Evaluation: 07/11/21 Total Assessment Time: 51 Minutes                                         Date of Report: 07/11/21 Evaluator: Buena Irish, MSW, LCSW                                Date of Follow-up:  07/22/2021 Referring Physician: Dr. Thermon Leyland, [facility]   Sources of Information Background Interview  Bariatric Questionnaire [place] Interview for Gastric Bypass Beck Anxiety Inventory (BAI) Beck Depression Inventory, 2nd Edition (BDI-II) Eating Disorder Diagnostic Scale (EDDS) Mental Status Examination Mood Disorder Questionnaire (MDQ) Review of Medical Record (provided by [facility])  Patient Identification and Chief Complaint: Courtney Chavez was born and raised in [place] and moved to the [place] in 2001. Courtney Chavez lives her family, four children, and is single parent. Courtney Chavez's parents are deceased. She has some family nearby, specifically her brother and sister Chavez. Courtney Chavez family is aware and supportive of her pursuit of bariatric surgery. Courtney Chavez has a history of bariatric surgery. There is a family history of hypertension and diabetes mellitus. There is no reported family history of mental health or substance use concerns.   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 Courtney Chavez has a high school degree.   Courtney Chavez has arthritis in knees, hypertension, insomnia, hx of meniscus tear, bilateral low back pain, pain in both knees, hyperlipidemia, and HIV.   Current medications include Aspirin 58m, Biktarvy 50-200-25m Hydrocodone-acetaminophen 5-325, and Lisinopril 2020mPlease see chart for most up-to-date medications. She has history of knee surgery (left). Courtney Chavez denied any caffeine, alcohol, tobacco, or substance use.     Patient's Understanding of the Procedure: Reasons for seeking bariatric surgery (perception of weight problem/motivation to lose weight): Understanding of surgical procedure/how well researched/awareness of risks involved:  Courtney Chavez does not have a complete understanding of the surgical process, dietary regimen and its varying stages, or the risks associated with the surgery. She would benefit from an in-depth review of the aforementioned areas with her surgeon and registered dietician. This will be a requirement for approval for surgery.   History of Weight Gains and Losses: Current weight, height, desired weight: 5'2", 280lbs, ~150lbs.  Rate current activity level from 1 (sedentary) to 10 (very active): 7 Walking (5k per work day day) Methods of exercise: walking Eating Disorder History (binging, purging, laxatives): Denied.  History of dieting and results:  2019: Intermittent fasting + gym 3x per week, Lost 35lbs in 2 months, maintained for 1 year.  2020: Intermittent fasting, lost 10lbs, maintained for 3 months.  March 2023 - May 2023:  Monitored diet with RD, portion control, no weight change. (required by her previous insurance provider).   Diet and Plans for the Future: Current diet/typical  meals:   Breakfast 2 eggs  [address] Noodles, [place], [place].   Snacks Green apple or grapes.    Plans for exercise post-surgery: Go to Gym.  How pt copes with stress: Alone time, prayer, talk to friend and family.  Other planned changes for the future: Improve overall health and fitness.   Mental Status Examination: Courtney Chavez  was early to the session and completed all the necessary paperwork. She was dressed casually and her hygiene was observed to be good. She was oriented to person, place, and time. Her attitude was cooperative and cheerful. There were no unusual psychomotor movements or changes. Speech patterns were normal in rate, tone, volume, and without  pressure. Affect was reactive and mood congruent. Thought processes were goal-directed and logical. Memory and concentration for both long and short-term were intact. Insight and judgement were good overall.   Intellectual and Psychological Functioning Courtney Chavez  very warm and friendly. She presented with no significant formal thought disorder. Prior to the session, Courtney Chavez  completed the Mood Disorder Questionnaire (MDQ) - with no affirmatives for symptoms of Bipolar. 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 Additionally, Courtney Chavez  completed The [place] Interview for Gastric Bypass during the session which did not identify any symptoms or behaviors of possible disordered eating.  However, it should be noted that Courtney Chavez did endorse a recent and reoccurring history of fasting as a compensatory behavior, previous use of a bi-yearly colon cleanse, and a history of excessive exercise (3-4 months ago) prior to knee surgery. In-depth psycho-education regarding this was provided during the session including the risks associated with such behaviors, post-surgery, including severe medical complications. Courtney Chavez expressed her understanding and her commitment to discontinue this behavior going forward.   Conclusions & Recommendations Courtney Chavez. Courtney Chavez  is a 46 year-old African female who was referred to the [name] by Dr. Thermon Leyland at [facility] for a psychological evaluation to determine her suitability for bariatric surgery.   With regard to bariatric surgery, Courtney Chavez. Courtney Chavez  appears to be highly motivated and has a solid  understanding of the bariatric surgery, as well as risks and lifestyle changes needed to promote post-surgical weight loss success. Results of this evaluation yielded a past history of no reported mental health concerns. At this time, Courtney Chavez. Illyanna Lacina  appears to be able to make an informed decision about the surgery she is contemplating. She was required to receive in-depth psycho-education regarding the surgery and the associated risks, along with the varying stages if the dietary regimen, by her surgeon and registered dietician, respectively. Evaluator received confirmation via secure email, from the surgeon's office, that  Courtney Chavez. Pocahontas Deboard received the necessary re-education on 08/05/21.  The patient appears to be motivated and expressed understanding of the post-surgical requirements. If her surgery is scheduled more than 3 months from today's date (07/22/2021) she is required to schedule a follow-up visit in order for this examiner to briefly re-evaluate her psychological status at that time. This follow-up visit should occur within three months of her scheduled surgery date.  The following recommendations are offered in order to promote Courtney Chavez. Amanpreet Hollifield 's health and well-being:  It  is recommended that the patient participate in educational sessions regarding a healthy diet and post-operative meal planning with a dietician or other health care providers.   Re-evaluation. If Courtney Chavez. Kersti Acocella 's surgery is scheduled more than three months from her date of approval, she is required to contact our office [phone]) for a brief check-in appointment about a month prior to her surgery date.   Nutritional Counseling. Courtney Chavez. Ebba Goll  is strongly encouraged to continue attending nutritional counseling appointments in order to plan a healthy diet and post-operative meals. It should be noted that Courtney Chavez. Sedra Caudell 's daily diet includes foods moderate proteins and higher carbohydrates  she is encouraged to make recommended changes to her diet prior to surgery in order to increase the chances of continuing a healthy diet after surgery.   Exercise. Courtney Chavez. Tawnia Schirm  is encouraged to participate in educational sessions on exercise that will be appropriate for her medical conditions and support her weight loss plans in a safe and healthy manner. Specifically, she is encouraged to consider participating in the Bariatric Exercise and Lifestyle Transformation (BELT) program, a partnership between [facility]. There, you'll join fellow [facility] bariatric patients three times a week for personalized aerobic and strength training instruction, as well as educational sessions on diet, exercise and behavior modification strategies. BELT meets at [place] at [employer].   Support groups. The patient is encouraged to join a support group to give him/her encouragement as s/he faces the psychological adjustments of bariatric surgery and the need to significantly adjust one's meals and food choices. A list of support groups offered through [facility] can be found through the bariatrics department website: [facility]  Self-help resources.  To develop strategies for managing emotional difficulties encountered before and after weight loss surgery, the patient is encouraged to read The Emotional First + Aid Kit: A Practical Guide to Life After Bariatric Surgery, Second Edition by Caren Griffins L. Sheppard Coil, PsyD. Examples of strategies discussed in this book include relieving stress without using food, developing and maintaining an exercise program, preventing relapse, etc. Courtney Chavez. Lamika Connolly  is strongly encouraged to practice mindful eating, the goal of which is to pay close attention to the smell, sight, taste, temperature, texture, etc. of food. Eating mindfully helps to eat  slower while enjoying food more fully. Useful books on mindful eating include Savor: Mindful Eating, Mindful Life and How to Eat, both by mindfulness expert Thich Nhat Hanh.  Mental health treatment. For additional support either prior to or after the surgery, Courtney Chavez. Lukisha Pogue  may consider seeking the care of a therapist (counselor, psychologist) in order to develop skills for coping with the adjustment to a new lifestyle. Available therapists include this examiner, as well as other clinicians within the [facility] [phone]). she may also seek other in-network providers in the community by searching online at DustingSprays.pl.   General recommendations for bariatric surgery: Replace the habit of late night snacking with something else (e.g., chewing gum, drinking water, or a relaxing activity like reading or crosswords that occupies your hands) and consider going to bed earlier.  Practice eating 4-6 meals per day. Each meal should last about 20 minutes. Practice drinking liquids 30 minutes before or after meals. Keep a food diary for 1 week. Record all foods eaten during the day, including snacks and drinks. Be very specific and very honest.  Get into the habit of reading food labels to evaluate content  of protein, sugars, carbohydrates, sodium, etc.  Continue to eat lots of vegetables.  Prepare meals at home, rather than take-out or fast food.  Take multivitamins including zinc and iron.  Develop exercise plans, including a low-impact and safe exercise plan to start 4-5 weeks into recovery, and a more intensive exercise plan for later.  Determine who will take care of any major responsibilities (particularly those involving physical activity, such as childcare) in the early stages of your recovery.  Educate family and friends who will be involved in your recovery about the extent and importance of your new lifestyle changes. The more they know, the better they  can support you and help you stay on track!                                                             07/22/2021                                                        __________________________________    ______________________ Buena Irish, MSW, LCSW      Date Licensed Clinical Social Worker [facility] [phone]

## 2021-07-22 NOTE — Progress Notes (Signed)
Time Spent: 10:05  am - 10:58 am : 38 Minutes  Talise Taliercio participated in the session via  video from home and consented to treatment. We met online due to COVID-19 pandemic. Identify was confirmed via three factors. Therapist participated from office. We completed the interactive feedback session which includes reviewing the following measures: Beck Anxiety Inventory (BAI), Beck's Depression Index (BDI), Eating Disorder Diagnostic Scale (EDDS), Mental Status Examination (MSE), & Mood Disorder Questionnaire (MDQ). During this interactive feedback session we discussed the results of the aforementioned measures, treatment recommendations, and the final determination regarding the psychological approval for bariatric surgery. Bonney is approved for surgery.   Code [phone] was billed to account for written report which includes integration of patient data, interpretation of standardized test results, interpretation of clinical data, & clinical decision making (60 minutes in total) completed on 07/11/21.  The interactive feedback session was completed today and a total of 53 minutes was spent on feedback alone. Code [phone] was billed for feedback session.   Diagnosis code: e66.01

## 2021-07-25 ENCOUNTER — Encounter: Payer: Self-pay | Admitting: Skilled Nursing Facility1

## 2021-07-25 ENCOUNTER — Encounter: Payer: Commercial Managed Care - HMO | Attending: Surgery | Admitting: Skilled Nursing Facility1

## 2021-07-25 DIAGNOSIS — E669 Obesity, unspecified: Secondary | ICD-10-CM | POA: Insufficient documentation

## 2021-07-25 NOTE — Progress Notes (Signed)
Supervised Weight Loss Visit Bariatric Nutrition Education  Planned Surgery: sleeve gastrectomy    Pt completed visits.   Pt has cleared nutrition requirements.    NUTRITION ASSESSMENT  Anthropometrics  Start weight at [facility]: 273.2 lbs (date: 02/21/2021)  Weight: 291 pounds BMI: 53.22 kg/m2     Clinical  Medical hx: HTN Medications: see list  Labs: glucose 143, cholesterol 218, LDL 153, a1C 7.1 Notable signs/symptoms: none reported including hypo or hyperglycemia  Any previous deficiencies? No  Lifestyle & Dietary Hx   Pt states she works third shift.  Pt states she does not eat beef or pork. Pt states she works a part time job 3 days a week 5pm-9pm; then 11pm-7am 4-6 days a week. Pt states she usually goes to sleep around 9am and wakes around 3-3:30 pm   Pt states she has bene trying some walking but her knee has been hurting and doe snot want to force it stating she has not been to the doctor for this: dietitian advised she work with hr doctor due to the condition inhibiting her activity.   Pt state she excepts to have more energy after surgery.   Pt state she already knows she will follow the dietary rule after surgery because she does not want to get sick.   Pt state she doe snot know what caused her weight gain for this appt.   Pt states he recognizes not eating too many carbs after surgery will be a challenging.   Pt states her main motivation is to be healthy for her children.   Pt states she is African she wants to be thicc and healthy.   Estimated daily fluid intake:  oz Supplements:  Current average weekly physical activity: ADL's  24-Hr Dietary Recall: woke up around 1pm; off work 7am into working 11pm First Meal: 8pm: chic fila salad  Snack:  Second Meal 5pm: rice and sauce (meat and veggies) Snack:  Third Meal 9pm: cereal Snack: granola bar Beverages: water, green tea, ginger tea   Estimated Energy Needs Calories: 1500   NUTRITION DIAGNOSIS   Overweight/obesity (Sed[place]-3.3) related to past poor dietary habits and physical inactivity as evidenced by patient w/ planned sleeve gastrectomy surgery following dietary guidelines for continued weight loss.      NUTRITION INTERVENTION  Nutrition counseling (C-1) and education (E-2) to facilitate bariatric surgery goals.  Throughly reviewed each category of food with many specific examples Throughly reviewed each dietary strategy integral for health and safety after surgery   Pre-Op Goals Progress & New Goals  continue: go to the gym the days you have off Continue: Increase water consumption continue: focus on the treadmill and the upper body for your workouts  continue: smaller portions  Handouts Provided Include    Learning Style & Readiness for Change Teaching method utilized: Visual & Auditory  Demonstrated degree of understanding via: Teach Back  Readiness Level: pre-contemplative Barriers to learning/adherence to lifestyle change: difficulty in change  RD's Notes for next Visit  Assess pts adherence to chosen goals   MONITORING & EVALUATION Dietary intake, weekly physical activity, body weight, and pre-op goals   Next Steps  Patient is to return to [facility] for pre op class Pt has completed visits. No further supervised visits required/recomended

## 2021-07-31 ENCOUNTER — Other Ambulatory Visit: Payer: Self-pay | Admitting: Family Medicine

## 2021-07-31 ENCOUNTER — Other Ambulatory Visit: Payer: Self-pay | Admitting: Orthopaedic Surgery

## 2021-07-31 DIAGNOSIS — M5441 Lumbago with sciatica, right side: Secondary | ICD-10-CM

## 2021-08-04 NOTE — Telephone Encounter (Signed)
We haven't seen her in 7-8 months.  Refill on norco not appropriate.

## 2021-08-09 ENCOUNTER — Other Ambulatory Visit: Payer: Self-pay | Admitting: Infectious Diseases

## 2021-08-19 ENCOUNTER — Encounter: Payer: Commercial Managed Care - HMO | Attending: Surgery | Admitting: Skilled Nursing Facility1

## 2021-08-19 ENCOUNTER — Encounter: Payer: Self-pay | Admitting: Skilled Nursing Facility1

## 2021-08-19 DIAGNOSIS — Z713 Dietary counseling and surveillance: Secondary | ICD-10-CM | POA: Insufficient documentation

## 2021-08-19 DIAGNOSIS — Z6841 Body Mass Index (BMI) 40.0 and over, adult: Secondary | ICD-10-CM | POA: Insufficient documentation

## 2021-08-19 DIAGNOSIS — E669 Obesity, unspecified: Secondary | ICD-10-CM

## 2021-08-19 NOTE — Progress Notes (Signed)
Pre-Operative Nutrition Class:    Patient was seen on 08/19/2021 for Pre-Operative Bariatric Surgery Education at [facility]    Surgery date: 09/24/2021 Surgery type: sleeve Start weight at [facility]: 273.2 Weight today: 285.9  Samples given per MNT protocol. Patient educated on appropriate usage:  Bariatric Advantage Multivitamin Lot # [id number] Exp:08/24   Bariatric Advantage Calcium  Lot #[id number] Exp: 12/23/2021   Protein Shake Lot # 01/24 Exp: [id number]  The following the learning objectives were met by the patient during this course: Identify Pre-Op Dietary Goals and will begin 2 weeks pre-operatively Identify appropriate sources of fluids and proteins  State protein recommendations and appropriate sources pre and post-operatively Identify Post-Operative Dietary Goals and will follow for 2 weeks post-operatively Identify appropriate multivitamin and calcium sources Describe the need for physical activity post-operatively and will follow MD recommendations State when to call healthcare provider regarding medication questions or post-operative complications When having a diagnosis of diabetes understanding hypoglycemia symptoms and the inclusion of 1 complex carbohydrate per meal  Handouts given during class include: Pre-Op Bariatric Surgery Diet Handout Protein Shake Handout Post-Op Bariatric Surgery Nutrition Handout BELT Program Information Flyer Support Group Information Flyer [facility] Bariatric Supplements Price List  Follow-Up Plan: Patient will follow-up at [facility] 2 weeks post operatively for diet advancement per MD.

## 2021-08-29 ENCOUNTER — Other Ambulatory Visit (HOSPITAL_COMMUNITY): Payer: Self-pay

## 2021-09-04 ENCOUNTER — Ambulatory Visit: Payer: Self-pay | Admitting: Surgery

## 2021-09-04 DIAGNOSIS — Z01818 Encounter for other preprocedural examination: Secondary | ICD-10-CM

## 2021-09-05 ENCOUNTER — Telehealth: Payer: Self-pay

## 2021-09-05 ENCOUNTER — Other Ambulatory Visit: Payer: Self-pay | Admitting: Infectious Diseases

## 2021-09-05 DIAGNOSIS — I10 Essential (primary) hypertension: Secondary | ICD-10-CM

## 2021-09-05 NOTE — Telephone Encounter (Signed)
Called patient regarding refill request for blood pressure medication, received from pharmacy.  Patient states she plans to follow Dr. Johnnye Sima. Patient aware she will no longer qualify for services provided here at [facility]. Confirmed she has phone number for internal medicine and stated she would call to make follow up appointment. Verbalized understanding she should call that office regarding refill requests.  Refill request forwarded to Dr. Johnnye Sima.  Binnie Kand, RN

## 2021-09-06 ENCOUNTER — Other Ambulatory Visit: Payer: Self-pay | Admitting: Infectious Diseases

## 2021-09-06 DIAGNOSIS — I10 Essential (primary) hypertension: Secondary | ICD-10-CM

## 2021-09-06 MED ORDER — LISINOPRIL 20 MG PO TABS
20.0000 mg | ORAL_TABLET | Freq: Every evening | ORAL | 3 refills | Status: DC
Start: 2021-09-06 — End: 2021-09-13

## 2021-09-11 ENCOUNTER — Other Ambulatory Visit (HOSPITAL_COMMUNITY): Payer: Self-pay

## 2021-09-12 NOTE — Patient Instructions (Signed)
DUE TO COVID-19 ONLY TWO VISITORS  (aged 46 and older)  ARE ALLOWED TO COME WITH YOU AND STAY IN THE WAITING ROOM ONLY DURING PRE OP AND PROCEDURE.   **NO VISITORS ARE ALLOWED IN THE SHORT STAY AREA OR RECOVERY ROOM!!**  IF YOU WILL BE ADMITTED INTO THE HOSPITAL YOU ARE ALLOWED ONLY FOUR SUPPORT PEOPLE DURING VISITATION HOURS ONLY (7 AM -8PM)   The support person(s) must pass our screening, gel in and out, and wear a mask at all times, including in the patient's room. Patients must also wear a mask when staff or their support person are in the room. Visitors GUEST BADGE MUST BE WORN VISIBLY  One adult visitor may remain with you overnight and MUST be in the room by 8 P.M.     Your procedure is scheduled on: 09/24/21   Report to [facility] Main Entrance    Report to admitting at : 5:15 AM   Call this number if you have problems the morning of surgery [phone]   MORNING OF SURGERY DRINK:   DRINK 1 G2 drink BEFORE YOU LEAVE HOME, DRINK ALL OF THE  G2 DRINK AT ONE TIME.   NO SOLID FOOD AFTER 600 PM THE NIGHT BEFORE YOUR SURGERY. YOU MAY DRINK CLEAR FLUIDS. THE G2 DRINK YOU DRINK BEFORE YOU LEAVE HOME WILL BE THE LAST FLUIDS YOU DRINK BEFORE SURGERY.  PAIN IS EXPECTED AFTER SURGERY AND WILL NOT BE COMPLETELY ELIMINATED. AMBULATION AND TYLENOL WILL HELP REDUCE INCISIONAL AND GAS PAIN. MOVEMENT IS KEY!  YOU ARE EXPECTED TO BE OUT OF BED WITHIN 4 HOURS OF ADMISSION TO YOUR PATIENT ROOM.  SITTING IN THE RECLINER THROUGHOUT THE DAY IS IMPORTANT FOR DRINKING FLUIDS AND MOVING GAS THROUGHOUT THE GI TRACT.  COMPRESSION STOCKINGS SHOULD BE WORN Justice UNLESS YOU ARE WALKING.   INCENTIVE SPIROMETER SHOULD BE USED EVERY HOUR WHILE AWAKE TO DECREASE POST-OPERATIVE COMPLICATIONS SUCH AS PNEUMONIA.  WHEN DISCHARGED HOME, IT IS IMPORTANT TO CONTINUE TO WALK EVERY HOUR AND USE THE INCENTIVE SPIROMETER EVERY HOUR.   After Midnight you may have the following liquids  until: 4:00 AM DAY OF SURGERY  Water Black Coffee (sugar ok, NO MILK/CREAM OR CREAMERS)  Tea (sugar ok, NO MILK/CREAM OR CREAMERS) regular and decaf                             Plain Jell-O (NO RED)                                           Fruit ices (not with fruit pulp, NO RED)                                     Popsicles (NO RED)                                                                  Juice: apple, WHITE grape, WHITE cranberry Sports drinks like Gatorade (NO RED)  Drink G2 drink AT : 4:00 AM the day of surgery.      The day of surgery:  Drink ONE (1) Pre-Surgery Clear Ensure or G2 at AM the morning of surgery. Drink in one sitting. Do not sip.  This drink was given to you during your hospital  pre-op appointment visit. Nothing else to drink after completing the  Pre-Surgery Clear Ensure or G2.          If you have questions, please contact your surgeon's office  Oral Hygiene is also important to reduce your risk of infection.                                    Remember - BRUSH YOUR TEETH THE MORNING OF SURGERY WITH YOUR REGULAR TOOTHPASTE   Do NOT smoke after Midnight   Take these medicines the morning of surgery with A SIP OF WATER: omeprazole,tylenol as needed.  DO NOT TAKE ANY ORAL DIABETIC MEDICATIONS DAY OF YOUR SURGERY  Bring CPAP mask and tubing day of surgery.                              You may not have any metal on your body including hair pins, jewelry, and body piercing             Do not wear make-up, lotions, powders, perfumes/cologne, or deodorant  Do not wear nail polish including gel and S&S, artificial/acrylic nails, or any other type of covering on natural nails including finger and toenails. If you have artificial nails, gel coating, etc. that needs to be removed by a nail salon please have this removed prior to surgery or surgery may need to be canceled/ delayed if the surgeon/ anesthesia feels like they are unable to be safely  monitored.   Do not shave  48 hours prior to surgery.    Do not bring valuables to the hospital. [place].   Contacts, dentures or bridgework may not be worn into surgery.   Bring small overnight bag day of surgery.   DO NOT [place]. PHARMACY WILL DISPENSE MEDICATIONS LISTED ON YOUR MEDICATION LIST TO YOU DURING YOUR ADMISSION [place]!    Patients discharged on the day of surgery will not be allowed to drive home.  Someone NEEDS to stay with you for the first 24 hours after anesthesia.   Special Instructions: Bring a copy of your healthcare power of attorney and living will documents         the day of surgery if you haven't scanned them before.              Please read over the following fact sheets you were given: IF YOU HAVE QUESTIONS ABOUT YOUR PRE-OP INSTRUCTIONS PLEASE CALL [phone]     [facility] - Preparing for Surgery Before surgery, you can play an important role.  Because skin is not sterile, your skin needs to be as free of germs as possible.  You can reduce the number of germs on your skin by washing with CHG (chlorahexidine gluconate) soap before surgery.  CHG is an antiseptic cleaner which kills germs and bonds with the skin to continue killing germs even after washing. Please DO NOT use  if you have an allergy to CHG or antibacterial soaps.  If your skin becomes reddened/irritated stop using the CHG and inform your nurse when you arrive at Short Stay. Do not shave (including legs and underarms) for at least 48 hours prior to the first CHG shower.  You may shave your face/neck. Please follow these instructions carefully:  1.  Shower with CHG Soap the night before surgery and the  morning of Surgery.  2.  If you choose to wash your hair, wash your hair first as usual with your  normal  shampoo.  3.  After you shampoo, rinse your hair and body thoroughly to remove the  shampoo.                            4.  Use CHG as you would any other liquid soap.  You can apply chg directly  to the skin and wash                       Gently with a scrungie or clean washcloth.  5.  Apply the CHG Soap to your body ONLY FROM THE NECK DOWN.   Do not use on face/ open                           Wound or open sores. Avoid contact with eyes, ears mouth and genitals (private parts).                       Wash face,  Genitals (private parts) with your normal soap.             6.  Wash thoroughly, paying special attention to the area where your surgery  will be performed.  7.  Thoroughly rinse your body with warm water from the neck down.  8.  DO NOT shower/wash with your normal soap after using and rinsing off  the CHG Soap.                9.  Pat yourself dry with a clean towel.            10.  Wear clean pajamas.            11.  Place clean sheets on your bed the night of your first shower and do not  sleep with pets. Day of Surgery : Do not apply any lotions/deodorants the morning of surgery.  Please wear clean clothes to the hospital/surgery center.  FAILURE TO FOLLOW THESE INSTRUCTIONS MAY RESULT IN THE CANCELLATION OF YOUR SURGERY PATIENT SIGNATURE_________________________________  NURSE SIGNATURE__________________________________  ________________________________________________________________________

## 2021-09-13 ENCOUNTER — Other Ambulatory Visit: Payer: Self-pay | Admitting: Infectious Diseases

## 2021-09-13 ENCOUNTER — Encounter (HOSPITAL_COMMUNITY)
Admission: RE | Admit: 2021-09-13 | Discharge: 2021-09-13 | Disposition: A | Discharge status: Home / Self Care | Payer: Commercial Managed Care - HMO | Source: Ambulatory Visit | Attending: Surgery | Admitting: Surgery

## 2021-09-13 ENCOUNTER — Other Ambulatory Visit: Payer: Self-pay

## 2021-09-13 ENCOUNTER — Encounter (HOSPITAL_COMMUNITY): Payer: Self-pay

## 2021-09-13 DIAGNOSIS — I10 Essential (primary) hypertension: Secondary | ICD-10-CM

## 2021-09-13 DIAGNOSIS — Z01812 Encounter for preprocedural laboratory examination: Secondary | ICD-10-CM | POA: Diagnosis present

## 2021-09-13 DIAGNOSIS — Z01818 Encounter for other preprocedural examination: Secondary | ICD-10-CM

## 2021-09-13 HISTORY — DX: Prediabetes: R73.03

## 2021-09-13 LAB — TYPE AND SCREEN
ABO/RH(D): O POS
Antibody Screen: NEGATIVE

## 2021-09-13 LAB — COMPREHENSIVE METABOLIC PANEL
ALT: 21 U/L (ref 0–44)
AST: 19 U/L (ref 15–41)
Albumin: 3.5 g/dL (ref 3.5–5.0)
Alkaline Phosphatase: 40 U/L (ref 38–126)
Anion gap: 6 (ref 5–15)
BUN: 11 mg/dL (ref 6–20)
CO2: 24 mmol/L (ref 22–32)
Calcium: 8.8 mg/dL — ABNORMAL LOW (ref 8.9–10.3)
Chloride: 107 mmol/L (ref 98–111)
Creatinine, Ser: 0.62 mg/dL (ref 0.44–1.00)
GFR, Estimated: >60 mL/min (ref >60)
Glucose, Bld: 166 mg/dL — ABNORMAL HIGH (ref 70–99)
Potassium: 3.5 mmol/L (ref 3.5–5.1)
Sodium: 137 mmol/L (ref 135–145)
Total Bilirubin: 0.5 mg/dL (ref 0.3–1.2)
Total Protein: 6.8 g/dL (ref 6.5–8.1)

## 2021-09-13 LAB — GLUCOSE, CAPILLARY: Glucose-Capillary: 189 mg/dL — ABNORMAL HIGH (ref 70–99)

## 2021-09-13 LAB — CBC WITH DIFFERENTIAL/PLATELET
Abs Immature Granulocytes: 0 10*3/uL (ref 0.00–0.07)
Basophils Absolute: 0 10*3/uL (ref 0.0–0.1)
Basophils Relative: 1 %
Eosinophils Absolute: 0.1 10*3/uL (ref 0.0–0.5)
Eosinophils Relative: 2 %
HCT: 35.8 % — ABNORMAL LOW (ref 36.0–46.0)
Hemoglobin: 11.7 g/dL — ABNORMAL LOW (ref 12.0–15.0)
Immature Granulocytes: 0 %
Lymphocytes Relative: 38 %
Lymphs Abs: 2.1 10*3/uL (ref 0.7–4.0)
MCH: 29.6 pg (ref 26.0–34.0)
MCHC: 32.7 g/dL (ref 30.0–36.0)
MCV: 90.6 fL (ref 80.0–100.0)
Monocytes Absolute: 0.6 10*3/uL (ref 0.1–1.0)
Monocytes Relative: 10 %
Neutro Abs: 2.7 10*3/uL (ref 1.7–7.7)
Neutrophils Relative %: 49 %
Platelets: 191 10*3/uL (ref 150–400)
RBC: 3.95 MIL/uL (ref 3.87–5.11)
RDW: 13.4 % (ref 11.5–15.5)
WBC: 5.5 10*3/uL (ref 4.0–10.5)
nRBC: 0 % (ref 0.0–0.2)

## 2021-09-13 LAB — HEMOGLOBIN A1C
Hgb A1c MFr Bld: 8.4 % — ABNORMAL HIGH (ref 4.8–5.6)
Mean Plasma Glucose: 194.38 mg/dL

## 2021-09-13 MED ORDER — LISINOPRIL 20 MG PO TABS: 20.0000 mg | ORAL_TABLET | Freq: Every evening | ORAL | 3 refills | Status: DC

## 2021-09-13 NOTE — Progress Notes (Addendum)
For Short Stay: [place] appointment date: Date of COVID positive in last 76 days:  Bowel Prep reminder:   For Anesthesia: PCP - Dr. Bobby Rumpf Cardiologist -   Chest x-ray - 12/26/20  EKG - 11/13/20 Stress Test -  ECHO -  Cardiac Cath -  Pacemaker/ICD device last checked: Pacemaker orders received: Device Rep notified:  Spinal Cord Stimulator:  Sleep Study -  CPAP -   Fasting Blood Sugar -  Checks Blood Sugar _____ times a day Date and result of last Hgb A1c-  Blood Thinner Instructions: Aspirin Instructions: Last Dose:  Activity level: Can go up a flight of stairs and activities of daily living without stopping and without chest pain and/or shortness of breath   Able to exercise without chest pain and/or shortness of breath   Unable to go up a flight of stairs without chest pain and/or shortness of breath     Anesthesia review: Hx: pre-DIA.HTN . BP was 164/112. Pt. Was informed to check BP at home and notify PCP if it continuous high.  Patient denies shortness of breath, fever, cough and chest pain at PAT appointment   Patient verbalized understanding of instructions that were given to them at the PAT appointment. Patient was also instructed that they will need to review over the PAT instructions again at home before surgery.

## 2021-09-16 NOTE — Progress Notes (Signed)
Hgba1c-8.4 routed to Dr Thermon Leyland on 09/16/21.  Chart given to [facility] for review.

## 2021-09-23 NOTE — Anesthesia Preprocedure Evaluation (Signed)
Anesthesia Evaluation  Patient identified by MRN, date of birth, ID band Patient awake    Reviewed: Allergy & Precautions, H&P , NPO status , Patient's Chart, lab work & pertinent test results  Airway Mallampati: II  TM Distance: >3 FB Neck ROM: Full    Dental  (+) Dental Advisory Given, Teeth Intact   Pulmonary neg pulmonary ROS,    Pulmonary exam normal breath sounds clear to auscultation       Cardiovascular Exercise Tolerance: Good hypertension, Pt. on medications Normal cardiovascular exam Rhythm:Regular Rate:Normal     Neuro/Psych negative neurological ROS  negative psych ROS   GI/Hepatic negative GI ROS, Neg liver ROS,   Endo/Other  Morbid obesity  Renal/GU negative Renal ROS     Musculoskeletal   Abdominal (+) + obese,   Peds  Hematology  (+) HIV,   Anesthesia Other Findings   Reproductive/Obstetrics negative OB ROS                            Anesthesia Physical  Anesthesia Plan  ASA: 3  Anesthesia Plan: General   Post-op Pain Management: Tylenol PO (pre-op)*, Ketamine IV*, Lidocaine infusion* and Gabapentin PO (pre-op)*   Induction: Intravenous  PONV Risk Score and Plan: 4 or greater and Treatment may vary due to age or medical condition, Midazolam, Scopolamine patch - Pre-op, Ondansetron and Dexamethasone  Airway Management Planned: Oral ETT  Additional Equipment: None  Intra-op Plan:   Post-operative Plan: Extubation in OR  Informed Consent: I have reviewed the patients History and Physical, chart, labs and discussed the procedure including the risks, benefits and alternatives for the proposed anesthesia with the patient or authorized representative who has indicated his/her understanding and acceptance.     Dental advisory given  Plan Discussed with: CRNA  Anesthesia Plan Comments: (2 x PIV)       Anesthesia Quick Evaluation

## 2021-09-24 ENCOUNTER — Inpatient Hospital Stay (HOSPITAL_COMMUNITY)
Admission: RE | Admit: 2021-09-24 | Discharge: 2021-09-26 | DRG: 620 | Disposition: A | Discharge status: Home / Self Care | Payer: Commercial Managed Care - HMO | Attending: Surgery | Admitting: Surgery

## 2021-09-24 ENCOUNTER — Other Ambulatory Visit: Payer: Self-pay

## 2021-09-24 ENCOUNTER — Inpatient Hospital Stay (HOSPITAL_COMMUNITY): Payer: Commercial Managed Care - HMO | Admitting: Physician Assistant

## 2021-09-24 ENCOUNTER — Inpatient Hospital Stay (HOSPITAL_COMMUNITY): Payer: Commercial Managed Care - HMO | Admitting: Anesthesiology

## 2021-09-24 ENCOUNTER — Encounter (HOSPITAL_COMMUNITY)
Admission: RE | Disposition: A | Discharge status: Home / Self Care | Payer: Self-pay | Source: Home / Self Care | Attending: Surgery

## 2021-09-24 ENCOUNTER — Encounter (HOSPITAL_COMMUNITY): Payer: Self-pay | Admitting: Surgery

## 2021-09-24 DIAGNOSIS — Z6841 Body Mass Index (BMI) 40.0 and over, adult: Secondary | ICD-10-CM

## 2021-09-24 DIAGNOSIS — Z8249 Family history of ischemic heart disease and other diseases of the circulatory system: Secondary | ICD-10-CM

## 2021-09-24 DIAGNOSIS — E785 Hyperlipidemia, unspecified: Secondary | ICD-10-CM | POA: Diagnosis present

## 2021-09-24 DIAGNOSIS — Z8632 Personal history of gestational diabetes: Secondary | ICD-10-CM | POA: Diagnosis not present

## 2021-09-24 DIAGNOSIS — I10 Essential (primary) hypertension: Secondary | ICD-10-CM

## 2021-09-24 DIAGNOSIS — E119 Type 2 diabetes mellitus without complications: Secondary | ICD-10-CM | POA: Diagnosis present

## 2021-09-24 DIAGNOSIS — Z79899 Other long term (current) drug therapy: Secondary | ICD-10-CM | POA: Diagnosis not present

## 2021-09-24 DIAGNOSIS — B20 Human immunodeficiency virus [HIV] disease: Secondary | ICD-10-CM | POA: Diagnosis present

## 2021-09-24 DIAGNOSIS — Z01818 Encounter for other preprocedural examination: Secondary | ICD-10-CM

## 2021-09-24 DIAGNOSIS — Z7982 Long term (current) use of aspirin: Secondary | ICD-10-CM

## 2021-09-24 HISTORY — PX: UPPER GI ENDOSCOPY: SHX6162

## 2021-09-24 LAB — CBC
HCT: 39 % (ref 36.0–46.0)
Hemoglobin: 12.7 g/dL (ref 12.0–15.0)
MCH: 29.5 pg (ref 26.0–34.0)
MCHC: 32.6 g/dL (ref 30.0–36.0)
MCV: 90.7 fL (ref 80.0–100.0)
Platelets: 191 10*3/uL (ref 150–400)
RBC: 4.3 MIL/uL (ref 3.87–5.11)
RDW: 13.4 % (ref 11.5–15.5)
WBC: 8.6 10*3/uL (ref 4.0–10.5)
nRBC: 0 % (ref 0.0–0.2)

## 2021-09-24 LAB — CREATININE, SERUM
Creatinine, Ser: 0.89 mg/dL (ref 0.44–1.00)
GFR, Estimated: >60 mL/min (ref >60)

## 2021-09-24 LAB — POCT PREGNANCY, URINE: Preg Test, Ur: NEGATIVE

## 2021-09-24 LAB — HEMOGLOBIN AND HEMATOCRIT, BLOOD
HCT: 40.5 % (ref 36.0–46.0)
Hemoglobin: 13.1 g/dL (ref 12.0–15.0)

## 2021-09-24 SURGERY — XI ROBOTIC GASTRIC SLEEVE RESECTION
Anesthesia: General

## 2021-09-24 MED ORDER — HYDROMORPHONE HCL 1 MG/ML IJ SOLN: 0.2500 mg | INTRAMUSCULAR | Status: DC | PRN

## 2021-09-24 MED ORDER — MIDAZOLAM HCL 2 MG/2ML IJ SOLN
INTRAMUSCULAR | Status: AC
  Filled 2021-09-24: qty 2

## 2021-09-24 MED ORDER — PANTOPRAZOLE SODIUM 40 MG IV SOLR
40.0000 mg | Freq: Every day | INTRAVENOUS | Status: DC
  Administered 2021-09-24 – 2021-09-25 (×2): 40 mg via INTRAVENOUS
  Filled 2021-09-24 (×2): qty 10

## 2021-09-24 MED ORDER — ONDANSETRON HCL 4 MG/2ML IJ SOLN: 4.0000 mg | INTRAMUSCULAR | Status: DC | PRN

## 2021-09-24 MED ORDER — LIDOCAINE HCL (PF) 2 % IJ SOLN
INTRAMUSCULAR | Status: DC | PRN
  Administered 2021-09-24: 1.5 mg/kg/h via INTRADERMAL

## 2021-09-24 MED ORDER — SUGAMMADEX SODIUM 500 MG/5ML IV SOLN
INTRAVENOUS | Status: AC
  Filled 2021-09-24: qty 5

## 2021-09-24 MED ORDER — PHENYLEPHRINE 80 MCG/ML (10ML) SYRINGE FOR IV PUSH (FOR BLOOD PRESSURE SUPPORT)
PREFILLED_SYRINGE | INTRAVENOUS | Status: AC
  Filled 2021-09-24: qty 10

## 2021-09-24 MED ORDER — APREPITANT 40 MG PO CAPS
40.0000 mg | ORAL_CAPSULE | ORAL | Status: AC
  Administered 2021-09-24: 40 mg via ORAL
  Filled 2021-09-24: qty 1

## 2021-09-24 MED ORDER — DEXAMETHASONE SODIUM PHOSPHATE 10 MG/ML IJ SOLN
INTRAMUSCULAR | Status: AC
  Filled 2021-09-24: qty 1

## 2021-09-24 MED ORDER — ACETAMINOPHEN 500 MG PO TABS
1000.0000 mg | ORAL_TABLET | Freq: Three times a day (TID) | ORAL | Status: DC
  Administered 2021-09-24 – 2021-09-25 (×4): 1000 mg via ORAL
  Filled 2021-09-24 (×4): qty 2

## 2021-09-24 MED ORDER — ASPIRIN 81 MG PO TBEC
81.0000 mg | DELAYED_RELEASE_TABLET | Freq: Every day | ORAL | Status: DC
  Administered 2021-09-25 – 2021-09-26 (×2): 81 mg via ORAL
  Filled 2021-09-24 (×2): qty 1

## 2021-09-24 MED ORDER — EPHEDRINE 5 MG/ML INJ
INTRAVENOUS | Status: AC
  Filled 2021-09-24: qty 5

## 2021-09-24 MED ORDER — BUPIVACAINE LIPOSOME 1.3 % IJ SUSP
INTRAMUSCULAR | Status: AC
Start: 2021-09-24 — End: ?
  Filled 2021-09-24: qty 20

## 2021-09-24 MED ORDER — FENTANYL CITRATE (PF) 100 MCG/2ML IJ SOLN
INTRAMUSCULAR | Status: DC | PRN
  Administered 2021-09-24 (×2): 100 ug via INTRAVENOUS
  Administered 2021-09-24: 50 ug via INTRAVENOUS

## 2021-09-24 MED ORDER — SIMETHICONE 80 MG PO CHEW
80.0000 mg | CHEWABLE_TABLET | Freq: Four times a day (QID) | ORAL | Status: DC | PRN
  Administered 2021-09-25: 80 mg via ORAL
  Filled 2021-09-24: qty 1

## 2021-09-24 MED ORDER — BUPIVACAINE LIPOSOME 1.3 % IJ SUSP: 20.0000 mL | Freq: Once | INTRAMUSCULAR | Status: DC

## 2021-09-24 MED ORDER — ENOXAPARIN SODIUM 30 MG/0.3ML IJ SOSY
30.0000 mg | PREFILLED_SYRINGE | Freq: Two times a day (BID) | INTRAMUSCULAR | Status: DC
Start: 2021-09-24 — End: 2021-09-26
  Administered 2021-09-24 – 2021-09-26 (×4): 30 mg via SUBCUTANEOUS
  Filled 2021-09-24 (×5): qty 0.3

## 2021-09-24 MED ORDER — CHLORHEXIDINE GLUCONATE 0.12 % MT SOLN
15.0000 mL | Freq: Once | OROMUCOSAL | Status: AC
  Administered 2021-09-24: 15 mL via OROMUCOSAL

## 2021-09-24 MED ORDER — SODIUM CHLORIDE 0.9 % IV SOLN
2.0000 g | INTRAVENOUS | Status: AC
  Administered 2021-09-24: 2 g via INTRAVENOUS
  Filled 2021-09-24: qty 2

## 2021-09-24 MED ORDER — BUPIVACAINE-EPINEPHRINE 0.25% -1:200000 IJ SOLN
INTRAMUSCULAR | Status: DC | PRN
  Administered 2021-09-24: 30 mL

## 2021-09-24 MED ORDER — PANTOPRAZOLE SODIUM 40 MG PO TBEC: 40.0000 mg | DELAYED_RELEASE_TABLET | Freq: Every day | ORAL | 0 refills | Status: AC

## 2021-09-24 MED ORDER — ACETAMINOPHEN 500 MG PO TABS: 1000.0000 mg | ORAL_TABLET | ORAL | Status: DC

## 2021-09-24 MED ORDER — ROCURONIUM BROMIDE 100 MG/10ML IV SOLN
INTRAVENOUS | Status: DC | PRN
  Administered 2021-09-24: 80 mg via INTRAVENOUS

## 2021-09-24 MED ORDER — EPHEDRINE SULFATE (PRESSORS) 50 MG/ML IJ SOLN
INTRAMUSCULAR | Status: DC | PRN
  Administered 2021-09-24: 10 mg via INTRAVENOUS

## 2021-09-24 MED ORDER — ENSURE MAX PROTEIN PO LIQD
2.0000 [oz_av] | ORAL | Status: DC
Start: 2021-09-25 — End: 2021-09-26
  Administered 2021-09-25 – 2021-09-26 (×13): 2 [oz_av] via ORAL

## 2021-09-24 MED ORDER — ONDANSETRON HCL 4 MG/2ML IJ SOLN
INTRAMUSCULAR | Status: DC | PRN
  Administered 2021-09-24: 4 mg via INTRAVENOUS

## 2021-09-24 MED ORDER — AMISULPRIDE (ANTIEMETIC) 5 MG/2ML IV SOLN: 10.0000 mg | Freq: Once | INTRAVENOUS | Status: DC | PRN

## 2021-09-24 MED ORDER — LISINOPRIL 20 MG PO TABS
20.0000 mg | ORAL_TABLET | Freq: Every evening | ORAL | Status: DC
  Administered 2021-09-24 – 2021-09-25 (×2): 20 mg via ORAL
  Filled 2021-09-24 (×2): qty 1

## 2021-09-24 MED ORDER — PROPOFOL 10 MG/ML IV BOLUS
INTRAVENOUS | Status: AC
  Filled 2021-09-24: qty 20

## 2021-09-24 MED ORDER — SCOPOLAMINE 1 MG/3DAYS TD PT72
1.0000 | MEDICATED_PATCH | TRANSDERMAL | Status: DC
  Administered 2021-09-24: 1.5 mg via TRANSDERMAL
  Filled 2021-09-24: qty 1

## 2021-09-24 MED ORDER — ACETAMINOPHEN 160 MG/5ML PO SOLN
1000.0000 mg | Freq: Three times a day (TID) | ORAL | Status: DC
  Administered 2021-09-25 – 2021-09-26 (×2): 1000 mg via ORAL
  Filled 2021-09-24 (×2): qty 40.6

## 2021-09-24 MED ORDER — ONDANSETRON 4 MG PO TBDP: 4.0000 mg | ORAL_TABLET | Freq: Four times a day (QID) | ORAL | 0 refills | Status: AC | PRN

## 2021-09-24 MED ORDER — CHLORHEXIDINE GLUCONATE CLOTH 2 % EX PADS: 6.0000 | MEDICATED_PAD | Freq: Once | CUTANEOUS | Status: DC

## 2021-09-24 MED ORDER — 0.9 % SODIUM CHLORIDE (POUR BTL) OPTIME
TOPICAL | Status: DC | PRN
  Administered 2021-09-24: 1000 mL

## 2021-09-24 MED ORDER — PHENYLEPHRINE HCL (PRESSORS) 10 MG/ML IV SOLN
INTRAVENOUS | Status: DC | PRN
  Administered 2021-09-24 (×6): 160 ug via INTRAVENOUS
  Administered 2021-09-24: 80 ug via INTRAVENOUS

## 2021-09-24 MED ORDER — MORPHINE SULFATE (PF) 2 MG/ML IV SOLN: 1.0000 mg | INTRAVENOUS | Status: DC | PRN

## 2021-09-24 MED ORDER — ONDANSETRON HCL 4 MG/2ML IJ SOLN
INTRAMUSCULAR | Status: AC
  Filled 2021-09-24: qty 2

## 2021-09-24 MED ORDER — BUPIVACAINE LIPOSOME 1.3 % IJ SUSP
INTRAMUSCULAR | Status: DC | PRN
  Administered 2021-09-24: 20 mL

## 2021-09-24 MED ORDER — ORAL CARE MOUTH RINSE: 15.0000 mL | Freq: Once | OROMUCOSAL | Status: AC

## 2021-09-24 MED ORDER — OXYCODONE HCL 5 MG/5ML PO SOLN
5.0000 mg | Freq: Four times a day (QID) | ORAL | Status: DC | PRN
  Administered 2021-09-24 – 2021-09-26 (×4): 5 mg via ORAL
  Filled 2021-09-24 (×4): qty 5

## 2021-09-24 MED ORDER — KETAMINE HCL 10 MG/ML IJ SOLN
INTRAMUSCULAR | Status: AC
  Filled 2021-09-24: qty 1

## 2021-09-24 MED ORDER — LIDOCAINE HCL (CARDIAC) PF 100 MG/5ML IV SOSY
PREFILLED_SYRINGE | INTRAVENOUS | Status: DC | PRN
  Administered 2021-09-24: 100 mg via INTRAVENOUS

## 2021-09-24 MED ORDER — KETAMINE HCL 10 MG/ML IJ SOLN
INTRAMUSCULAR | Status: DC | PRN
  Administered 2021-09-24: 50 mg via INTRAVENOUS

## 2021-09-24 MED ORDER — PROPOFOL 10 MG/ML IV BOLUS
INTRAVENOUS | Status: DC | PRN
  Administered 2021-09-24: 200 mg via INTRAVENOUS

## 2021-09-24 MED ORDER — ENOXAPARIN SODIUM 40 MG/0.4ML IJ SOSY
40.0000 mg | PREFILLED_SYRINGE | INTRAMUSCULAR | Status: AC
  Administered 2021-09-24: 40 mg via SUBCUTANEOUS
  Filled 2021-09-24: qty 0.4

## 2021-09-24 MED ORDER — BUPIVACAINE-EPINEPHRINE (PF) 0.25% -1:200000 IJ SOLN
INTRAMUSCULAR | Status: AC
  Filled 2021-09-24: qty 30

## 2021-09-24 MED ORDER — GABAPENTIN 300 MG PO CAPS
300.0000 mg | ORAL_CAPSULE | Freq: Once | ORAL | Status: AC
Start: 2021-09-24 — End: 2021-09-24
  Administered 2021-09-24: 300 mg via ORAL
  Filled 2021-09-24: qty 1

## 2021-09-24 MED ORDER — ACETAMINOPHEN 500 MG PO TABS
1000.0000 mg | ORAL_TABLET | Freq: Once | ORAL | Status: AC
  Administered 2021-09-24: 1000 mg via ORAL
  Filled 2021-09-24: qty 2

## 2021-09-24 MED ORDER — SUGAMMADEX SODIUM 500 MG/5ML IV SOLN
INTRAVENOUS | Status: DC | PRN
  Administered 2021-09-24: 400 mg via INTRAVENOUS

## 2021-09-24 MED ORDER — FENTANYL CITRATE (PF) 250 MCG/5ML IJ SOLN
INTRAMUSCULAR | Status: AC
  Filled 2021-09-24: qty 5

## 2021-09-24 MED ORDER — LACTATED RINGERS IV SOLN
INTRAVENOUS | Status: AC | PRN
  Administered 2021-09-24: 1000 mL via INTRAVENOUS

## 2021-09-24 MED ORDER — DEXAMETHASONE SODIUM PHOSPHATE 10 MG/ML IJ SOLN
INTRAMUSCULAR | Status: DC | PRN
  Administered 2021-09-24: 10 mg via INTRAVENOUS

## 2021-09-24 MED ORDER — LACTATED RINGERS IV SOLN: INTRAVENOUS | Status: DC

## 2021-09-24 MED ORDER — MIDAZOLAM HCL 5 MG/5ML IJ SOLN
INTRAMUSCULAR | Status: DC | PRN
  Administered 2021-09-24: 2 mg via INTRAVENOUS

## 2021-09-24 MED ORDER — GABAPENTIN 100 MG PO CAPS: 200.0000 mg | ORAL_CAPSULE | Freq: Two times a day (BID) | ORAL | 0 refills | Status: AC

## 2021-09-24 MED ORDER — ACETAMINOPHEN 500 MG PO TABS: 1000.0000 mg | ORAL_TABLET | Freq: Three times a day (TID) | ORAL | 0 refills | Status: AC

## 2021-09-24 MED ORDER — PROMETHAZINE HCL 25 MG/ML IJ SOLN: 6.2500 mg | INTRAMUSCULAR | Status: DC | PRN

## 2021-09-24 MED ORDER — BICTEGRAVIR-EMTRICITAB-TENOFOV 50-200-25 MG PO TABS
1.0000 | ORAL_TABLET | Freq: Every day | ORAL | Status: DC
  Administered 2021-09-24 – 2021-09-26 (×3): 1 via ORAL
  Filled 2021-09-24 (×3): qty 1

## 2021-09-24 MED ORDER — MEPERIDINE HCL 50 MG/ML IJ SOLN: 6.2500 mg | INTRAMUSCULAR | Status: DC | PRN

## 2021-09-24 MED ORDER — OXYCODONE HCL 5 MG PO TABS: 5.0000 mg | ORAL_TABLET | Freq: Four times a day (QID) | ORAL | 0 refills | Status: AC | PRN

## 2021-09-24 SURGICAL SUPPLY — 73 items
APPLIER CLIP 5 13 M/L LIGAMAX5 (MISCELLANEOUS)
APPLIER CLIP ROT 10 11.4 M/L (STAPLE)
BAG COUNTER SPONGE SURGICOUNT (BAG) ×2 IMPLANT
BLADE SURG SZ11 CARB STEEL (BLADE) ×2 IMPLANT
CANNULA REDUC XI 12-8 STAPL (CANNULA) ×2
CANNULA REDUCER 12-8 DVNC XI (CANNULA) ×1 IMPLANT
CHLORAPREP W/TINT 26 (MISCELLANEOUS) ×4 IMPLANT
CLIP APPLIE 5 13 M/L LIGAMAX5 (MISCELLANEOUS) IMPLANT
CLIP APPLIE ROT 10 11.4 M/L (STAPLE) IMPLANT
COVER SURGICAL LIGHT HANDLE (MISCELLANEOUS) ×2 IMPLANT
COVER TIP SHEARS 8 DVNC (MISCELLANEOUS) IMPLANT
COVER TIP SHEARS 8MM DA VINCI (MISCELLANEOUS)
DERMABOND ADVANCED (GAUZE/BANDAGES/DRESSINGS) ×1
DERMABOND ADVANCED .7 DNX12 (GAUZE/BANDAGES/DRESSINGS) ×1 IMPLANT
DRAPE ARM DVNC X/XI (DISPOSABLE) ×4 IMPLANT
DRAPE COLUMN DVNC XI (DISPOSABLE) ×1 IMPLANT
DRAPE DA VINCI XI ARM (DISPOSABLE) ×8
DRAPE DA VINCI XI COLUMN (DISPOSABLE) ×2
ELECT REM PT RETURN 15FT ADLT (MISCELLANEOUS) ×2 IMPLANT
GAUZE 4X4 16PLY ~~LOC~~+RFID DBL (SPONGE) ×2 IMPLANT
GLOVE BIO SURGEON STRL SZ7.5 (GLOVE) ×4 IMPLANT
GLOVE INDICATOR 8.0 STRL GRN (GLOVE) ×4 IMPLANT
GOWN STRL REUS W/ TWL XL LVL3 (GOWN DISPOSABLE) ×2 IMPLANT
GOWN STRL REUS W/TWL XL LVL3 (GOWN DISPOSABLE) ×4
GRASPER SUT TROCAR 14GX15 (MISCELLANEOUS) ×2 IMPLANT
HEMOSTAT SNOW SURGICEL 2X4 (HEMOSTASIS) IMPLANT
IRRIG SUCT STRYKERFLOW 2 WTIP (MISCELLANEOUS) ×2
IRRIGATION SUCT STRKRFLW 2 WTP (MISCELLANEOUS) ×1 IMPLANT
KIT BASIN OR (CUSTOM PROCEDURE TRAY) ×2 IMPLANT
KIT TURNOVER KIT A (KITS) ×1 IMPLANT
LUBRICANT JELLY K Y 4OZ (MISCELLANEOUS) IMPLANT
MARKER SKIN DUAL TIP RULER LAB (MISCELLANEOUS) ×1 IMPLANT
MAT PREVALON FULL STRYKER (MISCELLANEOUS) ×2 IMPLANT
NDL SPNL 18GX3.5 QUINCKE PK (NEEDLE) ×1 IMPLANT
NEEDLE SPNL 18GX3.5 QUINCKE PK (NEEDLE) ×2 IMPLANT
OBTURATOR OPTICAL STANDARD 8MM (TROCAR) ×2
OBTURATOR OPTICAL STND 8 DVNC (TROCAR) ×1
OBTURATOR OPTICALSTD 8 DVNC (TROCAR) ×1 IMPLANT
PACK CARDIOVASCULAR III (CUSTOM PROCEDURE TRAY) ×2 IMPLANT
RELOAD STAPLE 60 2.5 WHT DVNC (STAPLE) IMPLANT
RELOAD STAPLE 60 3.5 BLU DVNC (STAPLE) IMPLANT
RELOAD STAPLER 2.5X60 WHT DVNC (STAPLE) ×5 IMPLANT
RELOAD STAPLER 3.5X60 BLU DVNC (STAPLE) ×1 IMPLANT
SCISSORS LAP 5X35 DISP (ENDOMECHANICALS) IMPLANT
SEAL CANN UNIV 5-8 DVNC XI (MISCELLANEOUS) ×3 IMPLANT
SEAL XI 5MM-8MM UNIVERSAL (MISCELLANEOUS) ×6
SEALER VESSEL DA VINCI XI (MISCELLANEOUS) ×2
SEALER VESSEL EXT DVNC XI (MISCELLANEOUS) ×1 IMPLANT
SLEEVE GASTRECTOMY 40FR VISIGI (MISCELLANEOUS) IMPLANT
SOL ANTI FOG 6CC (MISCELLANEOUS) ×1 IMPLANT
SOLUTION ANTI FOG 6CC (MISCELLANEOUS) ×1
SOLUTION ELECTROLUBE (MISCELLANEOUS) ×2 IMPLANT
SPIKE FLUID TRANSFER (MISCELLANEOUS) ×2 IMPLANT
STAPLER 60 DA VINCI SURE FORM (STAPLE) ×2
STAPLER 60 SUREFORM DVNC (STAPLE) ×1 IMPLANT
STAPLER CANNULA SEAL DVNC XI (STAPLE) ×1 IMPLANT
STAPLER CANNULA SEAL XI (STAPLE) ×2
STAPLER RELOAD 2.5X60 WHITE (STAPLE) ×10
STAPLER RELOAD 2.5X60 WHT DVNC (STAPLE) ×5
STAPLER RELOAD 3.5X60 BLU DVNC (STAPLE) ×1
STAPLER RELOAD 3.5X60 BLUE (STAPLE) ×2
SUT MNCRL AB 4-0 PS2 18 (SUTURE) ×4 IMPLANT
SUT VIC AB 0 CT1 27 (SUTURE) ×2
SUT VIC AB 0 CT1 27XBRD ANTBC (SUTURE) ×1 IMPLANT
SUT VIC AB 2-0 SH 27 (SUTURE) ×2
SUT VIC AB 2-0 SH 27XBRD (SUTURE) ×1 IMPLANT
SYR 20ML LL LF (SYRINGE) ×2 IMPLANT
TOWEL OR 17X26 10 PK STRL BLUE (TOWEL DISPOSABLE) ×2 IMPLANT
TRAY FOLEY MTR SLVR 16FR STAT (SET/KITS/TRAYS/PACK) IMPLANT
TROCAR ADV FIXATION 12X100MM (TROCAR) IMPLANT
TROCAR Z-THREAD FIOS 5X100MM (TROCAR) ×2 IMPLANT
TUBE CALIBRATION LAPBAND (TUBING) IMPLANT
TUBING INSUFFLATION 10FT LAP (TUBING) ×2 IMPLANT

## 2021-09-24 NOTE — Op Note (Signed)
Patient: Courtney Chavez (08/25/1975, [id number])  Date of Surgery: 09/24/2021   Preoperative Diagnosis: MORBID OBESITY   Postoperative Diagnosis: MORBID OBESITY   Surgical Procedure: ROBOTIC SLEEVE GASTRECTOMY, BILATERAL TAP BLOCK: 26834 (CPT) UPPER GI ENDOSCOPY: [id number]   Operative Team Members:  Surgeon(s) and Role:    * Tamea Bai, Nickola Major, MD - Primary    * Greer Pickerel, MD - Assisting   Anesthesiologist: Nolon Nations, MD CRNA: Jonna Munro, CRNA; Montel Clock, CRNA   Anesthesia: General   Fluids:  Total I/O In: 900 [I.V.:800; IV Piggyback:100] Out: 20 [NLGXQ:11]  Complications: None  Drains:  none   Specimen:  ID Type Source Tests Collected by Time Destination  1 : Greater Curvature of Stomach Tissue PATH Other SURGICAL PATHOLOGY [name], Nickola Major, MD 09/24/2021 [phone]      Disposition:  PACU - hemodynamically stable.  Plan of Care: Admit for overnight observation    Indications for Procedure: Courtney Chavez is a 46 y.o. female who is seen for bariatric surgery consultation. The patient has morbid obesity with a BMI of 53, and the following conditions related to obesity: hypertension, Diabetes (HgA1C 7.5 on preoperative testing), hyperlipidemia (on preoperative testing),  We discussed the surgical options to treat obesity and its associated comorbidity. After discussing the available procedures in the region, we discussed in great detail the surgeries I offer: robotic sleeve gastrectomy and robotic roux-en-y gastric bypass. We discussed the procedures themselves as well as their risks, benefits and alternatives. I entered the patient's basic information into the [facility] Metabolic Surgery Risk/Benefit Calculator to facilitate this discussion.   After a full discussion and all questions answered, the patient is interested in pursuing a robotic sleeve gastrectomy.  We will initiate the bariatric surgery preoperative pathway to include the  following: - Bloodwork - Bariatric Nutrition Education - completed with [name], RD 07/25/21 - Chest x-ray - No active disease 12/25/20 - EKG - NSR 11/13/20 - Psychology evaluation - working with [name] - Upper endoscopy and colonoscopy - see above  After reviewing her workup and repeating our discussion today, she would still like to proceed with sleeve gastrectomy. We will proceed as scheduled   Findings: Normal anatomy, some scar tissue between liver and diaphragm  Infection status: Patient: Private Patient Elective Case Case: Elective Infection Present At Time Of Surgery (PATOS): None   Description of Procedure:   On the date stated above, the patient was taken to the operating room suite and placed in supine positioning.  General endotracheal anesthesia was induced.  A timeout was completed verifying the correct patient, procedure, positioning and equipment needed for the case.  The patient's abdomen was prepped and draped in the usual sterile fashion.  I entered the patient's right upper quadrant using a 5 mm trocar in the optical technique.  There was no trauma to underlying viscera with initial trocar placement.  The abdomen was insufflated 15 mmHg.  A total of 4 robotic trochars were placed across the mid abdomen, including the 5 mm initial trocar being upsized to a 8 mm trocar.  The robotic stapler trocar was placed in the number two position.  The [facility] liver retractor was placed through the subxiphoid region and under the left lobe of the liver and was connected to the rail of the bed.  A TAP block was placed using marcaine and Exparel under direct vision of the laparoscope.  The [name] was docked and we transitioned to robotic surgery.   Using  the tip up fenestrated grasper, fenestrated bipolar, 30 degree camera and Vessel Sealer from the patient's right to left, we began by dissecting the angle of His off the left crus of the diaphragm.   The adhesions between the stomach, spleen and diaphragm were divided using the Vessel Sealer to define the angle of His.    I then started 6 cm away from the pylorus along the greater curve the stomach and divided the gastroepiploic vessels and the gastrocolic ligament.  The lesser sac was entered.  There were really no adhesions to the posterior wall of the stomach.  The greater curve was mobilized working superiorly toward the spleen.  All of the gastroepiploic and short gastric vessels were divided as we divided the gastrocolic and gastrosplenic ligaments.  As we reached the splenic hilum, I lifted the stomach anteriorly.  I created a tunnel between the stomach and its attachments to the retroperitoneum posteriorly just to the left of the GE junction until I encountered the left crus and my previous angle of His dissection.  We then were able to approach the shortest of the short gastrics both from the greater curve the stomach laterally and from the left crus medially.  These were divided using the Vessel Sealer and the fundus of the stomach was fully mobilized.  With the stomach fully mobilized we direct our attention to stapling.  A 40 French VISI G was inserted into the stomach and positioned along the lesser curve the stomach and suction was applied.  The 60 mm robotic sureform linear stapler was used to create the sleeve gastrectomy.  We started 6 cm from the pylorus and were careful to avoid narrowing at the incisura.  We stayed about 1 cm away from the GE junction to protect the sling fibers.  We used blue and white loads of the linear stapler.  With the sleeve gastrectomy completed the VISI G was taken off suction and removed and we performed an upper endoscopy.  The foregut was submerged in saline irrigation and the adult upper endoscope was inserted into the stomach as far as the pylorus to inspect the sleeve.  The sleeve appeared appropriately oriented without any twisting.  There was good  hemostasis.  The sleeve was widely patent at the incisura with no narrowing.  There was no significant retained fundus.  The sleeve was inflated with the endoscope and there was no bubbling of the irrigation, suggesting a negative leak test and a airtight sleeve gastrectomy.  The foregut was decompressed with the endoscope and the endoscope was removed.  An omentopexy was performed, using running 2-0 vicryl suture to connect the inferior two staple lines to the divided omentum. There was good hemostasis at the end of the case.  The robot was undocked and moved away from the field.  The sleeve gastrectomy specimen was removed from the stapler port.  The fascia of the stapler port was closed using a 0 Vicryl on a PMI suture passer.   The liver retractor was removed under direct vision.  The pneumoperitoneum was evacuated.  The skin was closed using 4-0 Monocryl and Dermabond.  All sponge and needle counts were correct at the end of the case.    Louanna Raw, MD General, Bariatric, & Minimally Invasive Surgery [facility], [place]

## 2021-09-24 NOTE — Progress Notes (Signed)

## 2021-09-24 NOTE — Anesthesia Procedure Notes (Signed)
Procedure Name: Intubation Date/Time: 09/24/2021 7:33 AM  Performed by: Jonna Munro, CRNAPre-anesthesia Checklist: Patient identified, Emergency Drugs available, Suction available, Patient being monitored and Timeout performed Patient Re-evaluated:Patient Re-evaluated prior to induction Oxygen Delivery Method: Circle system utilized Preoxygenation: Pre-oxygenation with 100% oxygen Induction Type: IV induction Laryngoscope Size: Mac and 3 Grade View: Grade I Tube type: Oral Tube size: 7.0 mm Number of attempts: 1 Airway Equipment and Method: Stylet Placement Confirmation: ETT inserted through vocal cords under direct vision, positive ETCO2, CO2 detector and breath sounds checked- equal and bilateral Secured at: 22 cm Tube secured with: Tape Dental Injury: Teeth and Oropharynx as per pre-operative assessment

## 2021-09-24 NOTE — Progress Notes (Signed)
PHARMACY CONSULT FOR:  Risk Assessment for Post-Discharge VTE Following Bariatric Surgery  Post-Discharge VTE Risk Assessment: This patient's probability of 30-day post-discharge VTE is increased due to the factors marked: X Sleeve gastrectomy   Liver disorder (transplant, cirrhosis, or nonalcoholic steatohepatitis)   Hx of VTE   Hemorrhage requiring transfusion   GI perforation, leak, or obstruction   ====================================================    Female    Age >/=60 years  X  BMI >/=50 kg/m2    CHF    Dyspnea at Rest    Paraplegia  X  Non-gastric-band surgery    Operation Time >/=3 hr    Return to OR     Length of Stay >/= 3 d   Hypercoagulable condition   Significant venous stasis      Predicted probability of 30-day post-discharge VTE: 0.27%  Other patient-specific factors to consider: none  Recommendation for Discharge: No pharmacologic prophylaxis post-discharge  Courtney Chavez is a 46 y.o. female who underwent   ROBOTIC SLEEVE GASTRECTOMY 09/24/2021    No Known Allergies  Patient Measurements: Weight: 132.5 kg (292 lb) Body mass index is 53.41 kg/m.  No results for input(s): "WBC", "HGB", "HCT", "PLT", "APTT", "CREATININE", "LABCREA", "CREAT24HRUR", "MG", "PHOS", "ALBUMIN", "PROT", "AST", "ALT", "ALKPHOS", "BILITOT", "BILIDIR", "IBILI" in the last 72 hours. Estimated Creatinine Clearance: 115.3 mL/min (by C-G formula based on SCr of 0.62 mg/dL).    Past Medical History:  Diagnosis Date   HIV (human immunodeficiency virus infection) ([place])    HIV infection ([place])    Hypertension    Pre-diabetes      Medications Prior to Admission  Medication Sig Dispense Refill Last Dose   aspirin EC 81 MG tablet Take 1 tablet (81 mg total) by mouth daily. 90 tablet 3 Past Week   BIKTARVY 50-200-25 MG TABS tablet TAKE 1 TABLET BY MOUTH DAILY 30 tablet 5 09/23/2021   diclofenac Sodium (VOLTAREN) 1 % GEL Apply 1 Application topically daily as needed for pain.    Past Week   hydrochlorothiazide (HYDRODIURIL) 25 MG tablet TAKE 1/2 TABLET(12.5 MG) BY MOUTH DAILY (Patient taking differently: Take 37.5 mg by mouth every evening. TAKE 1/2 TABLET(12.5 MG) BY MOUTH DAILY) 90 tablet 3 09/23/2021   lisinopril (ZESTRIL) 20 MG tablet Take 1 tablet (20 mg total) by mouth every evening. TAKE 1 TABLET(20 MG) BY MOUTH DAILY 90 tablet 3 09/23/2021   omeprazole (PRILOSEC OTC) 20 MG tablet Take 1 tablet (20 mg total) by mouth daily. (Patient taking differently: Take 20 mg by mouth daily as needed (heartburn).) 30 tablet 1    pantoprazole (PROTONIX) 40 MG tablet Take 40 mg by mouth daily as needed (heartburn).      acetaminophen (TYLENOL) 500 MG tablet Take 500-1,000 mg by mouth every 6 (six) hours as needed (for pain.).   More than a month    Eudelia Bunch, Pharm.D 09/24/2021 10:41 AM

## 2021-09-24 NOTE — Anesthesia Postprocedure Evaluation (Signed)
Anesthesia Post Note  Patient: Occupational hygienist  Procedure(s) Performed: ROBOTIC SLEEVE GASTRECTOMY, BILATERAL TAP BLOCK UPPER GI ENDOSCOPY     Patient location during evaluation: PACU Anesthesia Type: General Level of consciousness: sedated and patient cooperative Pain management: pain level controlled Vital Signs Assessment: post-procedure vital signs reviewed and stable Respiratory status: spontaneous breathing Cardiovascular status: stable Anesthetic complications: no   No notable events documented.  Last Vitals:  Vitals:   09/24/21 1242 09/24/21 1329  BP: (!) 159/83 (!) 155/94  Pulse: 65 63  Resp: 18 18  Temp:    SpO2: 100% 100%    Last Pain:  Vitals:   09/24/21 1312  TempSrc:   PainSc: [place]

## 2021-09-24 NOTE — H&P (Signed)
Admitting Physician: Nickola Major Arlicia Paquette  Service: Bariatric surgery  CC: obesity  Subjective   HPI: Courtney Chavez is an 46 y.o. female who is here for sleeve gastrectomy  Past Medical History:  Diagnosis Date   HIV (human immunodeficiency virus infection) ([place])    HIV infection ([place])    Hypertension    Pre-diabetes     Past Surgical History:  Procedure Laterality Date   BIOPSY  04/03/2021   Procedure: BIOPSY;  Surgeon: Otis Brace, MD;  [facility]: [facility];  Service: Gastroenterology;;  EGD and COLON   COLONOSCOPY WITH PROPOFOL Bilateral 04/03/2021   Procedure: COLONOSCOPY WITH PROPOFOL;  Surgeon: Otis Brace, MD;  Location: [facility];  Service: Gastroenterology;  Laterality: Bilateral;   ESOPHAGOGASTRODUODENOSCOPY (EGD) WITH PROPOFOL Bilateral 04/03/2021   Procedure: ESOPHAGOGASTRODUODENOSCOPY (EGD) WITH PROPOFOL;  Surgeon: Otis Brace, MD;  [facility];  Service: Gastroenterology;  Laterality: Bilateral;   KNEE ARTHROSCOPY WITH MEDIAL MENISECTOMY Left 11/14/2020   Procedure: left knee partial lateral meniscectomy;  Surgeon: Leandrew Koyanagi, MD;  Location: [facility];  Service: Orthopedics;  Laterality: Left;   NO PAST SURGERIES     POLYPECTOMY  04/03/2021   Procedure: POLYPECTOMY;  Surgeon: Otis Brace, MD;  [facility];  Service: Gastroenterology;;    Family History  Problem Relation Age of Onset   Hypertension Mother    Hypertension Father    Hypertension Brother     Social:  reports that she has never smoked. She has never used smokeless tobacco. She reports that she does not currently use alcohol. She reports that she does not use drugs.  Allergies: No Known Allergies  Medications: Current Outpatient Medications  Medication Instructions   acetaminophen (TYLENOL) 500-1,000 mg, Oral, Every 6 hours PRN   aspirin EC 81 mg, Oral, Daily   BIKTARVY 50-200-25 MG TABS tablet TAKE 1 TABLET BY MOUTH DAILY    diclofenac Sodium (VOLTAREN) 1 % GEL 1 Application, Topical, Daily PRN   hydrochlorothiazide (HYDRODIURIL) 25 MG tablet TAKE 1/2 TABLET(12.5 MG) BY MOUTH DAILY   lisinopril (ZESTRIL) 20 mg, Oral, Every evening, TAKE 1 TABLET(20 MG) BY MOUTH DAILY   omeprazole (PRILOSEC OTC) 20 mg, Oral, Daily   pantoprazole (PROTONIX) 40 mg, Oral, Daily PRN    ROS - all of the below systems have been reviewed with the patient and positives are indicated with bold text General: chills, fever or night sweats Eyes: blurry vision or double vision ENT: epistaxis or sore throat Allergy/Immunology: itchy/watery eyes or nasal congestion Hematologic/Lymphatic: bleeding problems, blood clots or swollen lymph nodes Endocrine: temperature intolerance or unexpected weight changes Breast: new or changing breast lumps or nipple discharge Resp: cough, shortness of breath, or wheezing CV: chest pain or dyspnea on exertion GI: as per HPI GU: dysuria, trouble voiding, or hematuria MSK: joint pain or joint stiffness Neuro: TIA or stroke symptoms Derm: pruritus and skin lesion changes Psych: anxiety and depression  Objective   PE Blood pressure (!) 146/91, pulse 98, temperature 98.4 F (36.9 C), temperature source Oral, resp. rate 18, weight 132.5 kg, last menstrual period 09/08/2021, SpO2 99 %. Constitutional: NAD; conversant; no deformities Eyes: Moist conjunctiva; no lid lag; anicteric; PERRL Neck: Trachea midline; no thyromegaly Lungs: Normal respiratory effort; no tactile fremitus CV: RRR; no palpable thrills; no pitting edema GI: Abd soft, nontender; no palpable hepatosplenomegaly MSK: Normal range of motion of extremities; no clubbing/cyanosis Psychiatric: Appropriate affect; alert and oriented x3 Lymphatic: No palpable cervical or axillary lymphadenopathy  Results for orders placed  or performed during the hospital encounter of 09/24/21 (from the past 24 hour(s))  Pregnancy, urine POC     Status: None    Collection Time: 09/24/21  5:52 AM  Result Value Ref Range   Preg Test, Ur NEGATIVE NEGATIVE    Imaging Orders  No imaging studies ordered today  Upper endoscopy 04/03/21 with Dr. Alessandra Bevels - Z-line variable, 36 cm from the incisors. - Gastritis. Biopsied. - Normal duodenal bulb, first portion of the duodenum and second portion of the duodenum.  Colonoscopy 04/03/21 with Dr. Alessandra Bevels - One 2 mm polyp in the proximal ascending colon, removed with a cold biopsy forceps. Resected and retrieved. - Congested, erythematous and inflamed mucosa in the distal sigmoid colon. Biopsied. - Internal hemorrhoids.  A. STOMACH, BIOPSY:  - Gastric antral and oxyntic mucosa with Helicobacter pylori-associated  gastritis  - Helicobacter pylori-like organisms were identified on routine HE  stain   B. COLON, ASCENDING, POLYPECTOMY:  - Tubular adenoma  - Negative for high-grade dysplasia or malignancy   C. COLON, SIGMOID, BIOPSY:  - Colonic mucosa with nonspecific elastosis and fibrosis  (elastofibromatous change)  - Negative for dysplasia or malignancy    Assessment and Plan    Assessment and Plan:  Assessment   Diagnoses and all orders for this visit:  Preoperative testing Morbid obesity with body mass index (BMI) of 45.0 to 49.9 in adult (CMS-HCC) Hypertension, unspecified type Type 2 diabetes Hyperlipidemia    Courtney Chavez is a 46 y.o. female who is seen for bariatric surgery consultation. The patient has morbid obesity with a BMI of 53, and the following conditions related to obesity: hypertension, Diabetes (HgA1C 7.5 on preoperative testing), hyperlipidemia (on preoperative testing),  We discussed the surgical options to treat obesity and its associated comorbidity. After discussing the available procedures in the region, we discussed in great detail the surgeries I offer: robotic sleeve gastrectomy and robotic roux-en-y gastric bypass. We discussed the procedures themselves  as well as their risks, benefits and alternatives. I entered the patient's basic information into the [facility] Metabolic Surgery Risk/Benefit Calculator to facilitate this discussion.   After a full discussion and all questions answered, the patient is interested in pursuing a robotic sleeve gastrectomy.  We will initiate the bariatric surgery preoperative pathway to include the following: - Bloodwork - Bariatric Nutrition Education - completed with [name], RD 07/25/21 - Chest x-ray - No active disease 12/25/20 - EKG - NSR 11/13/20 - Psychology evaluation - working with [facility] - Upper endoscopy and colonoscopy - see above  After reviewing her workup and repeating our discussion today, she would still like to proceed with sleeve gastrectomy. We will proceed as scheduled    Felicie Morn, MD  [facility] Use [email or web address] to contact on call provider

## 2021-09-24 NOTE — Discharge Instructions (Signed)

## 2021-09-24 NOTE — Transfer of Care (Signed)
Immediate Anesthesia Transfer of Care Note  Patient: Courtney Chavez  Procedure(s) Performed: ROBOTIC SLEEVE GASTRECTOMY, BILATERAL TAP BLOCK UPPER GI ENDOSCOPY  Patient Location: PACU  Anesthesia Type:General  Level of Consciousness: awake, drowsy and patient cooperative  Airway & Oxygen Therapy: Patient Spontanous Breathing and Patient connected to face mask oxygen  Post-op Assessment: Report given to RN, Post -op Vital signs reviewed and stable and Patient moving all extremities X 4  Post vital signs: Reviewed and stable  Last Vitals:  Vitals Value Taken Time  BP 158/96 09/24/21 0908  Temp    Pulse 73 09/24/21 0912  Resp 17 09/24/21 0912  SpO2 100 % 09/24/21 0912  Vitals shown include unvalidated device data.  Last Pain:  Vitals:   09/24/21 0549  TempSrc: Oral  PainSc: 0-No pain         Complications: No notable events documented.

## 2021-09-25 ENCOUNTER — Encounter (HOSPITAL_COMMUNITY): Payer: Self-pay | Admitting: Surgery

## 2021-09-25 LAB — CBC WITH DIFFERENTIAL/PLATELET
Abs Immature Granulocytes: 0.03 10*3/uL (ref 0.00–0.07)
Basophils Absolute: 0 10*3/uL (ref 0.0–0.1)
Basophils Relative: 0 %
Eosinophils Absolute: 0 10*3/uL (ref 0.0–0.5)
Eosinophils Relative: 0 %
HCT: 38.5 % (ref 36.0–46.0)
Hemoglobin: 12.6 g/dL (ref 12.0–15.0)
Immature Granulocytes: 0 %
Lymphocytes Relative: 15 %
Lymphs Abs: 1.6 10*3/uL (ref 0.7–4.0)
MCH: 29.7 pg (ref 26.0–34.0)
MCHC: 32.7 g/dL (ref 30.0–36.0)
MCV: 90.8 fL (ref 80.0–100.0)
Monocytes Absolute: 0.8 10*3/uL (ref 0.1–1.0)
Monocytes Relative: 8 %
Neutro Abs: 8 10*3/uL — ABNORMAL HIGH (ref 1.7–7.7)
Neutrophils Relative %: 77 %
Platelets: 210 10*3/uL (ref 150–400)
RBC: 4.24 MIL/uL (ref 3.87–5.11)
RDW: 13.3 % (ref 11.5–15.5)
WBC: 10.5 10*3/uL (ref 4.0–10.5)
nRBC: 0 % (ref 0.0–0.2)

## 2021-09-25 LAB — SURGICAL PATHOLOGY

## 2021-09-25 NOTE — Progress Notes (Signed)
Transition of Care [facility]) Screening Note  Patient Details  Name: Courtney Chavez Date of Birth: May 18, 1975  Transition of Care [facility]) CM/SW Contact:    Sherie Don, LCSW Phone Number: 09/25/2021, 9:34 AM  Transition of Care Department [facility]) has reviewed patient and no TOC needs have been identified at this time. We will continue to monitor patient advancement through interdisciplinary progression rounds. If new patient transition needs arise, please place a TOC consult.

## 2021-09-25 NOTE — Progress Notes (Signed)
Progress Note: General Surgery Service   Chief Complaint/Subjective: Doing well POD1  Objective: Vital signs in last 24 hours: Temp:  [97.5 F (36.4 C)-98.2 F (36.8 C)] 98.1 F (36.7 C) (08/09 1333) Pulse Rate:  [55-67] 55 (08/09 1333) Resp:  [16-18] 17 (08/09 1333) BP: (132-159)/(78-99) 159/99 (08/09 1333) SpO2:  [94 %-100 %] 94 % (08/09 1333) Last BM Date : 09/24/21  Intake/Output from previous day: 08/08 0701 - 08/09 0700 In: 2881 [P.O.:500; I.V.:2281; IV Piggyback:100] Out: 1120 [Urine:1100; Blood:20] Intake/Output this shift: Total I/O In: 559.1 [P.O.:60; I.V.:499.1] Out: 300 [Urine:300]  Constitutional: NAD; conversant; no deformities Eyes: Moist conjunctiva; no lid lag; anicteric; PERRL Neck: Trachea midline; no thyromegaly Lungs: Normal respiratory effort; no tactile fremitus CV: RRR; no palpable thrills; no pitting edema GI: Abd Incisions c/d/I, tender; no palpable hepatosplenomegaly MSK: Normal range of motion of extremities; no clubbing/cyanosis Psychiatric: Appropriate affect; alert and oriented x3 Lymphatic: No palpable cervical or axillary lymphadenopathy  Lab Results: CBC  Recent Labs    09/24/21 1036 09/24/21 1247 09/25/21 0420  WBC 8.6  --  10.5  HGB 12.7 13.1 12.6  HCT 39.0 40.5 38.5  PLT 191  --  210   BMET Recent Labs    09/24/21 1036  CREATININE 0.89   PT/INR No results for input(s): "LABPROT", "INR" in the last 72 hours. ABG No results for input(s): "PHART", "HCO3" in the last 72 hours.  Invalid input(s): "PCO2", "PO2"  Anti-infectives: Anti-infectives (From admission, onward)    Start     Dose/Rate Route Frequency Ordered Stop   09/24/21 1300  bictegravir-emtricitabine-tenofovir AF (BIKTARVY) 50-200-25 MG per tablet 1 tablet        1 tablet Oral Daily 09/24/21 1014     09/24/21 0600  cefoTEtan (CEFOTAN) 2 g in sodium chloride 0.9 % 100 mL IVPB        2 g 200 mL/hr over 30 Minutes Intravenous On call to O.R. 09/24/21 0529  09/24/21 0750       Medications: Scheduled Meds:  acetaminophen  1,000 mg Oral Q8H   Or   acetaminophen (TYLENOL) oral liquid 160 mg/5 mL  1,000 mg Oral Q8H   aspirin EC  81 mg Oral Daily   bictegravir-emtricitabine-tenofovir AF  1 tablet Oral Daily   enoxaparin (LOVENOX) injection  30 mg Subcutaneous Q12H   lisinopril  20 mg Oral QPM   pantoprazole (PROTONIX) IV  40 mg Intravenous QHS   Ensure Max Protein  2 oz Oral Q2H   Continuous Infusions:  lactated ringers 75 mL/hr at 09/25/21 0650   PRN Meds:.morphine injection, ondansetron (ZOFRAN) IV, oxyCODONE, simethicone  Assessment/Plan: s/p Procedure(s): ROBOTIC SLEEVE GASTRECTOMY, BILATERAL TAP BLOCK UPPER GI ENDOSCOPY 09/24/2021  Doing well POD1 Keep tonight for pain control and PO intake improvement.  Plan for discharge home tomorrow Labs vitals look good   LOS: 1 day    Felicie Morn, [facility] Use [email or web address] to contact on call provider  Daily Billing: [phone] - post op

## 2021-09-25 NOTE — Progress Notes (Signed)
Patient alert and oriented, pain is controlled. Patient is tolerating fluids, advanced to protein shake today, patient is tolerating well. Reviewed Gastric sleeve discharge instructions with patient and patient is able to articulate understanding. Provided information on BELT program, Support Group and [facility] outpatient pharmacy. Will communicate general update of patient status to surgeon. All questions answered. 24hr fluid recall is 544m per hydration protocol, bariatric nurse coordinator to make follow-up phone call within one week.

## 2021-09-26 LAB — CBC WITH DIFFERENTIAL/PLATELET
Abs Immature Granulocytes: 0.01 10*3/uL (ref 0.00–0.07)
Basophils Absolute: 0 10*3/uL (ref 0.0–0.1)
Basophils Relative: 0 %
Eosinophils Absolute: 0 10*3/uL (ref 0.0–0.5)
Eosinophils Relative: 0 %
HCT: 36.6 % (ref 36.0–46.0)
Hemoglobin: 11.6 g/dL — ABNORMAL LOW (ref 12.0–15.0)
Immature Granulocytes: 0 %
Lymphocytes Relative: 43 %
Lymphs Abs: 3.2 10*3/uL (ref 0.7–4.0)
MCH: 29.1 pg (ref 26.0–34.0)
MCHC: 31.7 g/dL (ref 30.0–36.0)
MCV: 91.7 fL (ref 80.0–100.0)
Monocytes Absolute: 0.6 10*3/uL (ref 0.1–1.0)
Monocytes Relative: 8 %
Neutro Abs: 3.5 10*3/uL (ref 1.7–7.7)
Neutrophils Relative %: 49 %
Platelets: 169 10*3/uL (ref 150–400)
RBC: 3.99 MIL/uL (ref 3.87–5.11)
RDW: 13.4 % (ref 11.5–15.5)
WBC: 7.3 10*3/uL (ref 4.0–10.5)
nRBC: 0 % (ref 0.0–0.2)

## 2021-09-26 NOTE — Progress Notes (Signed)
Pt is ready for discharge.  Remains alert and oriented.  AVS given.  Pt able to verbalize understanding of all discharge instructions, including wound care. All questions answered.

## 2021-09-26 NOTE — TOC Progression Note (Signed)
Transition of Care [facility]) - Progression Note   Patient Details  Name: Courtney Chavez MRN: [id number] Date of Birth: 02-Mar-1975  Transition of Care [facility]) CM/SW [place], LCSW Phone Number: 09/26/2021, 11:36 AM  Clinical Narrative: TOC received message from [place] with [facility] [phone] ext. [phone]) regarding possible discharge needs/planning. CSW left VM for Janett Billow that there are no expected discharge needs at this time.  Barriers to Discharge: No Barriers Identified  Readmission Risk Interventions     No data to display

## 2021-09-26 NOTE — Plan of Care (Signed)
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
## 2021-09-27 ENCOUNTER — Other Ambulatory Visit: Payer: Self-pay | Admitting: Infectious Diseases

## 2021-09-27 ENCOUNTER — Telehealth (HOSPITAL_COMMUNITY): Payer: Self-pay | Admitting: *Deleted

## 2021-09-27 NOTE — Telephone Encounter (Signed)
Refill Request- Pt prev used [facility] and is now requesting her prescription from the following pharmacy  BIKTARVY 50-200-25 MG TABS tablet   [place], [place], [place] in: [employer] Address: [facility] [place], [place], [place]  [phone]

## 2021-09-27 NOTE — Telephone Encounter (Signed)
Patient name: Courtney Chavez Time of Call: 8:25-8:36   1. Tell me about your pain and pain management?   Patient was discharged 1 day ago  She stated she overall feels well  reported an 8/10 , just took her prescription pain medication Discussed with patient to try and add a heating pad to his/her abdomen with changing positions to assist with the discomfort. ?Encouraged pt to try options and/or contact [facility] if still concerned.     2. ?Let's talk about fluid intake. ?How much total fluid are you taking in?  Pt states that s/he has gotten in at least 10-12oz of fluid including protein shakes, bottled water, and soup upon waking this morning. Pt states that s/he is working to meet goal of 64 oz of fluid today.  Pt has been able to consume approx. 557oz of fluid per 24hr period since surgery, per [facility].  Pt plans to increase clear liquids to meet fluid goals.     Pt states that s/he is working to meet goal of 64 oz of fluid today.  Pt encouraged to continue to work towards meeting goal.  Pt instructed to assess status and suggestions daily utilizing Hydration Action Plan on discharge folder and to call [facility] if in the "red zone".     3. ?How much protein have you taken in the last 2 days?  Pt states s/he is meeting his/her goal of 80g/60g of protein each day with the protein shakes and continued working on it while in the hospital.   Pt states that s/he is working to meet goal of goal of 80g/60g of protein today.  Pt has not consumed protein shake yet this morning, but plans to.  She had just taken her medication with propel water. Pt plans to drink remainder of protein throughout the rest of the day to meet goal.    4. ?Have you had nausea? ?Tell me about when have experienced nausea and what you did to help? Did not hear pt report nausea. ?  5. ?Has the frequency or color changed with your urine?  Pt states that s/he is urinating "fine" with no changes in frequency or urgency.?    ?  6. ?Tell me  what your incisions look like?   "Incisions look fine".?Pt denies a fever, chills. ?Pt states incisions are not swollen, open, or draining.  ?  7. ?Have you been passing gas? BM?  Pt encouraged to take MiraLAX or milk of magnesium to have a bm by today    Pt states that s/he has had a BM on yesterday night  09/26/21 Pt encouraged to continue with fluid to help with regular bm ?  8. ?If a problem or question were to arise who would you call? ?Do you know contact numbers for [place], [facility], and [facility]?  Pt denies dehydration symptoms.  Pt can describe s/sx of dehydration. ?Pt knows to call [facility] for surgical, [facility] for nutrition, and [facility] for non-urgent questions or concerns.  ?  9. ?How has the walking going?  Pt states s/he is walking around and able to be active without difficulty.  walking around the house ?  10. Are you still using your incentive spirometer?  If so, how often?  Pt states that s/he is doing the I.S.   Pt encouraged to use incentive spirometer, at least 10x every hour while awake until s/he sees the surgeon.    11. ?How are your vitamins and calcium going? ?How are you taking them?  Pt states  that s/he will try taking his/her supplements and vitamins on Monday of next week, so she can focus on just getting her fluid and protein in on this weekend

## 2021-09-30 MED ORDER — BIKTARVY 50-200-25 MG PO TABS: 1.0000 | ORAL_TABLET | Freq: Every day | ORAL | 5 refills | Status: DC

## 2021-10-08 ENCOUNTER — Encounter: Payer: Commercial Managed Care - HMO | Attending: Surgery | Admitting: Dietician

## 2021-10-08 ENCOUNTER — Encounter: Payer: Self-pay | Admitting: Dietician

## 2021-10-08 DIAGNOSIS — Z6841 Body Mass Index (BMI) 40.0 and over, adult: Secondary | ICD-10-CM | POA: Insufficient documentation

## 2021-10-08 NOTE — Progress Notes (Signed)
2 Week Post-Operative Nutrition Class   Patient was seen on 10/08/2021 for Post-Operative Nutrition education at the [facility].    Surgery date: 09/24/2021 Surgery type: sleeve  Anthropometrics  Start weight at [facility]: 273.2 lbs (date: 02/21/2021)  Height: 62 in. Weight: 278.1 pounds BMI: 50.87 kg/m2     Clinical  Medical hx: HTN Medications: see list  Labs: glucose 143, cholesterol 218, LDL 153, a1C 7.1 Notable signs/symptoms: none reported including hypo or hyperglycemia  Any previous deficiencies? No   Body Composition Scale 10/08/2021  Current Body Weight 278.1  Total Body Fat % 49.0  Visceral Fat 21  Fat-Free Mass % 50.9   Total Body Water % 39.9  Muscle-Mass lbs 30.3  BMI 50.7  Body Fat Displacement          Torso  lbs 84.5         Left Leg  lbs 16.9         Right Leg  lbs 16.9         Left Arm  lbs 8.4         Right Arm   lbs 8.4      The following the learning objectives were met by the patient during this course: Identifies Phase 3 (Soft, High Proteins) Dietary Goals and will begin from 2 weeks post-operatively to 2 months post-operatively Identifies appropriate sources of fluids and proteins  Identifies appropriate fat sources and healthy verses unhealthy fat types   States protein recommendations and appropriate sources post-operatively Identifies the need for appropriate texture modifications, mastication, and bite sizes when consuming solids Identifies appropriate fat consumption and sources Identifies appropriate multivitamin and calcium sources post-operatively Describes the need for physical activity post-operatively and will follow MD recommendations States when to call healthcare provider regarding medication questions or post-operative complications   Handouts given during class include: Phase 3A: Soft, High Protein Diet Handout Phase 3 High Protein Meals Healthy Fats   Follow-Up Plan: Patient will follow-up at  [facility] in 6 weeks for 2 month post-op nutrition visit for diet advancement per MD.

## 2021-10-14 ENCOUNTER — Telehealth: Payer: Self-pay | Admitting: Skilled Nursing Facility1

## 2021-10-14 NOTE — Telephone Encounter (Signed)
RD called pt to verify fluid intake once starting soft, solid proteins 2 week post-bariatric surgery.   Daily Fluid intake: 64 oz Daily Protein intake: 60 g Bowel Habits: daily without issue  Concerns/issues:   Pt states she has one hard stool on one day it was terrible but It only happened once.   Pt shares no concerns or sign of dehydration

## 2021-10-15 ENCOUNTER — Ambulatory Visit (INDEPENDENT_AMBULATORY_CARE_PROVIDER_SITE_OTHER): Payer: Commercial Managed Care - HMO | Admitting: Orthopaedic Surgery

## 2021-10-15 ENCOUNTER — Ambulatory Visit (INDEPENDENT_AMBULATORY_CARE_PROVIDER_SITE_OTHER): Payer: Commercial Managed Care - HMO

## 2021-10-15 DIAGNOSIS — M25562 Pain in left knee: Secondary | ICD-10-CM
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
## 2021-10-15 MED ORDER — BUPIVACAINE HCL 0.25 % IJ SOLN
2.0000 mL | INTRAMUSCULAR | Status: AC | PRN
  Administered 2021-10-15: 2 mL via INTRA_ARTICULAR

## 2021-10-15 MED ORDER — LIDOCAINE HCL 1 % IJ SOLN
2.0000 mL | INTRAMUSCULAR | Status: AC | PRN
  Administered 2021-10-15: 2 mL

## 2021-10-15 MED ORDER — TRAMADOL HCL 50 MG PO TABS: 50.0000 mg | ORAL_TABLET | Freq: Two times a day (BID) | ORAL | 2 refills | Status: AC | PRN

## 2021-10-15 MED ORDER — METHYLPREDNISOLONE ACETATE 40 MG/ML IJ SUSP
40.0000 mg | INTRAMUSCULAR | Status: AC | PRN
  Administered 2021-10-15: 40 mg via INTRA_ARTICULAR
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
## 2021-11-16 ENCOUNTER — Other Ambulatory Visit: Payer: Self-pay | Admitting: Infectious Diseases

## 2021-11-16 DIAGNOSIS — I10 Essential (primary) hypertension: Secondary | ICD-10-CM

## 2021-11-21 ENCOUNTER — Encounter: Payer: Commercial Managed Care - HMO | Attending: Surgery | Admitting: Dietician

## 2021-11-21 ENCOUNTER — Encounter: Payer: Self-pay | Admitting: Dietician

## 2021-11-21 DIAGNOSIS — E669 Obesity, unspecified: Secondary | ICD-10-CM | POA: Insufficient documentation

## 2021-11-21 NOTE — Progress Notes (Signed)
Bariatric Nutrition Follow-Up Visit Medical Nutrition Therapy  Appt Start Time: 4:00   End Time: 4:31  Surgery date: 09/24/2021 Surgery type: sleeve  NUTRITION ASSESSMENT  Anthropometrics  Start weight at [facility]: 273.2 lbs (date: 02/21/2021)  Height: 62 in. Weight: 262.3 pounds BMI: 47.98 kg/m2     Clinical  Medical hx: HTN Medications: see list  Labs: glucose 143, cholesterol 218, LDL 153, a1C 7.1 Notable signs/symptoms: none reported including hypo or hyperglycemia  Any previous deficiencies? No   Body Composition Scale 10/08/2021 11/21/2021  Current Body Weight 278.1 262.3  Total Body Fat % 49.0 47.7  Visceral Fat 21 19  Fat-Free Mass % 50.9 52.2   Total Body Water % 39.9 40.6  Muscle-Mass lbs 30.3 30.2  BMI 50.7 47.9  Body Fat Displacement           Torso  lbs 84.5 77.6         Left Leg  lbs 16.9 15.5         Right Leg  lbs 16.9 15.5         Left Arm  lbs 8.4 7.7         Right Arm   lbs 8.4 7.7     Lifestyle & Dietary Hx  Pt states she finds it difficult to find things to eat, stating she is looking for more variety.  Pt states she fell and work and that has slowed her down getting back to the gym, stating she is doing better and wants to get back to the gym.  Pt states she walks a lot at work on the job. 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 Pt has self advanced by eating oatmeal and sweet potatoes. Pt states she is tolerating them well. Dietitian advanced pt to non-starchy vegetables, and emphasized focusing on non-starchy vegetables after she eats her protein at meals.  Pt is agreeable to focus on non-starchy vegetables.  Estimated daily fluid intake: 48-64 oz Estimated daily protein intake: 60 g Supplements: multivitamin and calcium Current average weekly physical activity: ADLs   24-Hr Dietary Recall First Meal: 1:45 pm wake up: oatmeal or egg Snack: sugar free pop cycle   Second Meal: 6 pm sweet potato and baked chicken Snack: greek yogurt or protein bar  Third Meal:  Snack: protein shake Beverages: water  Post-Op Goals/ Signs/ Symptoms Using straws: no Drinking while eating: no Chewing/swallowing difficulties: no Changes in vision: no Changes to mood/headaches: no Hair loss/changes to skin/nails: no Difficulty focusing/concentrating: no Sweating: no Limb weakness: no Dizziness/lightheadedness: no Palpitations: no  Carbonated/caffeinated beverages: no N/V/D/C/Gas: no Abdominal pain: no Dumping syndrome: no    NUTRITION DIAGNOSIS  Overweight/obesity ([place]-3.3) related to past poor dietary habits and physical inactivity as evidenced by completed bariatric surgery and following dietary guidelines for continued weight loss and healthy nutrition status.     NUTRITION INTERVENTION Nutrition counseling (C-1) and education (E-2) to facilitate bariatric surgery goals, including: Diet advancement to the next phase (phase 4) now including non-starchy vegetables The importance of consuming adequate calories as well as certain nutrients daily due to the body's need for essential vitamins, minerals, and fats The importance of daily physical activity and to reach a goal of at least 150 minutes of moderate to vigorous physical activity weekly (or as directed by their physician) due to benefits such as increased musculature and improved lab values The importance of intuitive eating specifically learning hunger-satiety  cues and understanding the importance of learning a new body: The importance of mindful eating to avoid grazing behaviors   Handouts Provided Include  Phase 4 Food Plan  Learning Style & Readiness for Change Teaching method utilized: Visual & Auditory  Demonstrated degree of understanding via: Teach Back  Readiness Level: contemplative Barriers to learning/adherence to lifestyle change: previous habits; motivation  RD's Notes for Next  Visit Assess adherence to pt chosen goals   MONITORING & EVALUATION Dietary intake, weekly physical activity, body weight.  Next Steps Patient is to follow-up in January for 6 month post-op follow-up.

## 2021-11-26 ENCOUNTER — Encounter: Payer: Commercial Managed Care - HMO | Admitting: Infectious Diseases

## 2021-11-26 ENCOUNTER — Other Ambulatory Visit: Payer: Commercial Managed Care - HMO

## 2021-12-03 ENCOUNTER — Ambulatory Visit (INDEPENDENT_AMBULATORY_CARE_PROVIDER_SITE_OTHER): Payer: Commercial Managed Care - HMO | Admitting: Infectious Diseases

## 2021-12-03 VITALS — BP 143/94 | HR 94 | Temp 98.6°F | Wt 261.7 lb

## 2021-12-03 DIAGNOSIS — Z23 Encounter for immunization: Secondary | ICD-10-CM | POA: Diagnosis not present

## 2021-12-03 DIAGNOSIS — I10 Essential (primary) hypertension: Secondary | ICD-10-CM | POA: Diagnosis not present

## 2021-12-03 DIAGNOSIS — L918 Other hypertrophic disorders of the skin: Secondary | ICD-10-CM | POA: Diagnosis not present

## 2021-12-03 DIAGNOSIS — Z6841 Body Mass Index (BMI) 40.0 and over, adult: Secondary | ICD-10-CM

## 2021-12-03 DIAGNOSIS — B20 Human immunodeficiency virus [HIV] disease: Secondary | ICD-10-CM

## 2021-12-03 DIAGNOSIS — R7303 Prediabetes: Secondary | ICD-10-CM

## 2021-12-03 DIAGNOSIS — R739 Hyperglycemia, unspecified: Secondary | ICD-10-CM

## 2021-12-03 DIAGNOSIS — Z79899 Other long term (current) drug therapy: Secondary | ICD-10-CM | POA: Diagnosis not present

## 2021-12-03 DIAGNOSIS — Z113 Encounter for screening for infections with a predominantly sexual mode of transmission: Secondary | ICD-10-CM

## 2021-12-03 NOTE — Assessment & Plan Note (Signed)
Will recheck her A1C today Will get her in to [facility]MT[facility] clinic for f/u.

## 2021-12-03 NOTE — Progress Notes (Signed)
Subjective:    Patient ID: Courtney Chavez, female  DOB: 1975/08/05, 46 y.o.        MRN: [id number]   HPI 46 yo F from [place] came to [place] 2001. 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 Was changed to complera then to triumeq due to wt gain. Has 3 sons (02-2004, 07-2005, 02-10-10). They are doing well.  She was on Isentress- truvada while pregnant. She was then changed back to triumeq after delivering. Now on biktarvy.  She has 60 yo daughter (July 2020).   BP is slightly up today, states she was taken off her anti-htn as her BP has been improving since having her surgery.      She had up and lower endoscopy 03-2021. +H pylori and adenomatous (benign) polyp in colon. She is on rx for H pylori.    She has been seen at wt loss clinic. She had sleeve gastrectomy loss 09-24-2021 and has lost 30#.  Now gets full quickly, doesn't get hungry as per previously. She does not have reflux. Has had constipation since surgery- drinking 3 16 oz bottles of water/day.    HIV 1 RNA Quant (Copies/mL)  Date Value  04/19/2021 Not Detected  06/28/2020 49 (H)  09/20/2019 <20   CD4 T Cell Abs (/uL)  Date Value  06/28/2020 889  09/20/2019 687  06/11/2018 530     Health Maintenance  Topic Date Due  . COVID-19 Vaccine (4 - [place] risk series) 06/14/2021  . INFLUENZA VACCINE  09/17/2021  . PAP SMEAR-Modifier  05/13/2024  . TETANUS/TDAP  06/01/2028  . COLONOSCOPY (Pts 45-61yr Insurance coverage will need to be confirmed)  04/04/2031  . Hepatitis C Screening  Completed  . HIV Screening  Completed  . HPV VACCINES  Aged Out      Review of Systems  Constitutional:  Negative for weight loss.  Eyes:  Negative for blurred vision.  Respiratory:  Negative for shortness of breath.   Cardiovascular:  Negative for chest pain.  Gastrointestinal:  Positive for constipation. Negative for diarrhea.  Genitourinary:  Negative for dysuria.  Neurological:  Negative for headaches.    Please see HPI. All  other systems reviewed and negative.     Objective:  Physical Exam Vitals and nursing note reviewed.  Constitutional:      Appearance: Normal appearance. She is obese.  HENT:     Mouth/Throat:     Mouth: Mucous membranes are moist.     Pharynx: Oropharynx is clear.  Eyes:     Extraocular Movements: Extraocular movements intact.     Conjunctiva/sclera: Conjunctivae normal.  Cardiovascular:     Rate and Rhythm: Normal rate and regular rhythm.  Pulmonary:     Effort: Pulmonary effort is normal.     Breath sounds: Normal breath sounds.  Abdominal:     General: Bowel sounds are normal. There is no distension.     Palpations: Abdomen is soft.     Tenderness: There is no abdominal tenderness.  Musculoskeletal:        General: Normal range of motion.     Right lower leg: No edema.     Left lower leg: No edema.  Neurological:     General: No focal deficit present.     Mental Status: She is alert and oriented to person, place, and time.  Psychiatric:        Mood and Affect: Mood normal.          Assessment & Plan:

## 2021-12-03 NOTE — Assessment & Plan Note (Signed)
She is doing well Will check her labs today Flu shot today Advised to go to pharm for [place] biktarvy My appreciation to Ms Doren Custard for her PAP.  Will send for mammo rtc in 9 months

## 2021-12-03 NOTE — Assessment & Plan Note (Signed)
Multiple skin tags around head and neck She would like removed, will refer to derm (cryo)

## 2021-12-03 NOTE — Assessment & Plan Note (Signed)
Improved with surgery Will watch her progress/trajectory.

## 2021-12-03 NOTE — Assessment & Plan Note (Addendum)
She is off her bp rx. Is asx. Will follow now that she has lost wt.  See if she needs to resume.

## 2021-12-04 LAB — T-HELPER CELLS (CD4) COUNT (NOT AT ARMC)
CD4 % Helper T Cell: 39 % (ref 33–65)
CD4 T Cell Abs: 692 /uL (ref 400–1790)

## 2021-12-05 LAB — COMPREHENSIVE METABOLIC PANEL
ALT: 18 IU/L (ref 0–32)
AST: 18 IU/L (ref 0–40)
Albumin/Globulin Ratio: 1.6 (ref 1.2–2.2)
Albumin: 4.1 g/dL (ref 3.9–4.9)
Alkaline Phosphatase: 53 IU/L (ref 44–121)
BUN/Creatinine Ratio: 12 (ref 9–23)
BUN: 9 mg/dL (ref 6–24)
Bilirubin Total: 0.2 mg/dL (ref 0.0–1.2)
CO2: 23 mmol/L (ref 20–29)
Calcium: 9.4 mg/dL (ref 8.7–10.2)
Chloride: 105 mmol/L (ref 96–106)
Creatinine, Ser: 0.73 mg/dL (ref 0.57–1.00)
Globulin, Total: 2.5 g/dL (ref 1.5–4.5)
Glucose: 161 mg/dL — ABNORMAL HIGH (ref 70–99)
Potassium: 3.9 mmol/L (ref 3.5–5.2)
Sodium: 141 mmol/L (ref 134–144)
Total Protein: 6.6 g/dL (ref 6.0–8.5)
eGFR: 103 mL/min/{1.73_m2} (ref >59)

## 2021-12-05 LAB — CBC
Hematocrit: 37.4 % (ref 34.0–46.6)
Hemoglobin: 12.4 g/dL (ref 11.1–15.9)
MCH: 29.5 pg (ref 26.6–33.0)
MCHC: 33.2 g/dL (ref 31.5–35.7)
MCV: 89 fL (ref 79–97)
Platelets: 194 10*3/uL (ref 150–450)
RBC: 4.2 x10E6/uL (ref 3.77–5.28)
RDW: 14.2 % (ref 11.7–15.4)
WBC: 4.8 10*3/uL (ref 3.4–10.8)

## 2021-12-05 LAB — HEMOGLOBIN A1C
Est. average glucose Bld gHb Est-mCnc: 143 mg/dL
Hgb A1c MFr Bld: 6.6 % — ABNORMAL HIGH (ref 4.8–5.6)

## 2021-12-05 LAB — HIV-1 RNA QUANT-NO REFLEX-BLD: HIV-1 RNA Viral Load: <20 copies/mL

## 2021-12-11 ENCOUNTER — Other Ambulatory Visit: Payer: Self-pay | Admitting: Infectious Diseases

## 2021-12-11 DIAGNOSIS — Z1231 Encounter for screening mammogram for malignant neoplasm of breast: Secondary | ICD-10-CM

## 2021-12-11 DIAGNOSIS — B20 Human immunodeficiency virus [HIV] disease: Secondary | ICD-10-CM

## 2022-02-03 ENCOUNTER — Ambulatory Visit
Admission: RE | Admit: 2022-02-03 | Discharge: 2022-02-03 | Disposition: A | Discharge status: Home / Self Care | Payer: Commercial Managed Care - HMO | Source: Ambulatory Visit | Attending: Infectious Diseases | Admitting: Infectious Diseases

## 2022-02-20 ENCOUNTER — Ambulatory Visit: Payer: Commercial Managed Care - HMO | Admitting: Dietician

## 2022-02-24 ENCOUNTER — Ambulatory Visit: Payer: Commercial Managed Care - HMO | Admitting: Dietician

## 2022-03-03 ENCOUNTER — Encounter: Payer: Commercial Managed Care - HMO | Attending: Surgery | Admitting: Dietician

## 2022-03-03 ENCOUNTER — Encounter: Payer: Self-pay | Admitting: Dietician

## 2022-03-03 VITALS — Ht 62.0 in | Wt 242.6 lb

## 2022-03-03 DIAGNOSIS — E669 Obesity, unspecified: Secondary | ICD-10-CM | POA: Insufficient documentation

## 2022-03-03 NOTE — Progress Notes (Signed)
Bariatric Nutrition Follow-Up Visit Medical Nutrition Therapy  Appt Start Time:  8:55  End Time: 9:30  Surgery date: 09/24/2021 Surgery type: sleeve  NUTRITION ASSESSMENT  Anthropometrics  Start weight at [facility]: 273.2 lbs (date: 02/21/2021)  Height: 62 in. Weight: 242.6 pounds BMI: 44.37 kg/m2     Clinical  Medical hx: HTN Medications: see list  Labs: glucose 143, cholesterol 218, LDL 153, a1C 7.1 Notable signs/symptoms: none reported including hypo or hyperglycemia  Any previous deficiencies? No   Body Composition Scale 10/08/2021 11/21/2021 03/03/2022  Current Body Weight 278.1 262.3 242.5  Total Body Fat % 49.0 47.7 46.0  Visceral Fat '21 19 17  '$ Fat-Free Mass % 50.9 52.2 53.9   Total Body Water % 39.9 40.6 41.4  Muscle-Mass lbs 30.3 30.2 29.9  BMI 50.7 47.9 44.2  Body Fat Displacement            Torso  lbs 84.5 77.6 69.2         Left Leg  lbs 16.9 15.5 13.8         Right Leg  lbs 16.9 15.5 13.8         Left Arm  lbs 8.4 7.7 6.9         Right Arm   lbs 8.4 7.7 6.9     Lifestyle & Dietary Hx  Pt states she is still taking the chewable multivitamin, stating she will take it with milk or protein shake, stating she can't take it with water. Pt states she fell and work and that has slowed her down getting back to the gym, stating she is doing better and wants to get back to the gym.  Pt states she walks a lot at work on the job. Pt states since she has lost some weight her knee is getting better. Pt states she gets full easily and then hungry again sooner. Pt states she got a second job, stating she just bought a house. Pt states she is off on Fridays and every other weekend. Pt states she tries different protein bars, stating she can get bored with the same one all the time. Dietitian recommended protein bars with fiber in the double digits per serving. Pt states she fasts a lot, stating she is will be doing 28 day of fasting for religious reasons, which is 6 am to 6 pm.  After discussion, pt states she is going to stop the fasting. Pt states lately she may throw up if something does not agree with her, stating that she is not sure what kinds of food does it.  Pt states she can eat the same thing again after throwing up and be fine. Pt states this will happen once or twice a week. Pt states she mostly eats fish.  Estimated daily fluid intake: 48-64 oz Estimated daily protein intake: 60 g Supplements: multivitamin and calcium Current average weekly physical activity: ADLs   24-Hr Dietary Recall First Meal: 1:45 pm wake up: skip; ginger tea Snack: protein bar or nuts  Second Meal: 6 pm sweet potato and baked chicken or fish Snack: greek yogurt or protein bar  Third Meal:  Snack: protein shake Beverages: water, protein water  Post-Op Goals/ Signs/ Symptoms Using straws: no Drinking while eating: no Chewing/swallowing difficulties: no Changes in vision: no Changes to mood/headaches: no Hair loss/changes to skin/nails: no Difficulty focusing/concentrating: no Sweating: no Limb weakness: no Dizziness/lightheadedness: no Palpitations: no  Carbonated/caffeinated beverages: no N/V/D/C/Gas: no Abdominal pain: no Dumping syndrome: no    NUTRITION  DIAGNOSIS  Overweight/obesity ([place]-3.3) related to past poor dietary habits and physical inactivity as evidenced by completed bariatric surgery and following dietary guidelines for continued weight loss and healthy nutrition status.     NUTRITION INTERVENTION Nutrition counseling (C-1) and education (E-2) to facilitate bariatric surgery goals, including: Diet advancement to the Standard Prep Plan The importance of consuming adequate calories as well as certain nutrients daily due to the body's need for essential vitamins, minerals, and fats The importance of daily physical activity and to reach a goal of at least 150 minutes of moderate to vigorous physical activity weekly (or as directed by their physician)  due to benefits such as increased musculature and improved lab values The importance of intuitive eating specifically learning hunger-satiety cues and understanding the importance of learning a new body: The importance of mindful eating to avoid grazing behaviors  Why you need complex carbohydrates: Whole grains and other complex carbohydrates are required to have a healthy diet. Whole grains provide fiber which can help with blood glucose levels and help keep you satiated. Fruits and starchy vegetables provide essential vitamins and minerals required for immune function, eyesight support, brain support, bone density, wound healing and many other functions within the body. According to the current evidenced based 2020-2025 Dietary Guidelines for Americans, complex carbohydrates are part of a healthy eating pattern which is associated with a decreased risk for type 2 diabetes, cancers, and cardiovascular disease.  Purpose of hydration: Water makes up over 50% of your total body water, and is part of many organs throughout the body. Water is essential to transport digested nutrients, regulate body temperature, rid the body of waste products, and protects joints and the spinal cord. When not properly hydrated you will begin to experience headaches, cramps and dizziness. Further dehydration can result in rapid heart rate, shock, oliguria, and may cause seizures.  https://www.merckmanuals.com/home/hormonal-and-metabolic-disordehttps://www.usgs.gov/special-topic/water-science-school/science/water-you-water-and-human-body?qt-science_center_objects=0#qt-science_center_objectsrs/water-balance/about-body-water HistoricalGrowth.gl https://www.stevens.org/ PimpTShirt.fi https://www.health.InvestmentBrowse.at  Handouts Provided  Include  Standard Prep Plan Handout  Learning Style & Readiness for Change Teaching method utilized: Visual & Auditory  Demonstrated degree of understanding via: Teach Back  Readiness Level: contemplative Barriers to learning/adherence to lifestyle change: previous habits; motivation  RD's Notes for Next Visit Assess adherence to pt chosen goals   MONITORING & EVALUATION Dietary intake, weekly physical activity, body weight.  Next Steps Patient is to follow-up in April for 9/10 month follow-up.

## 2022-06-02 ENCOUNTER — Ambulatory Visit: Payer: Commercial Managed Care - HMO | Admitting: Dietician

## 2022-06-19 ENCOUNTER — Ambulatory Visit: Payer: Commercial Managed Care - HMO | Admitting: Dietician

## 2022-08-09 ENCOUNTER — Other Ambulatory Visit: Payer: Self-pay | Admitting: Infectious Diseases

## 2022-09-23 IMAGING — DX DG CHEST 2V
2 series · 2 of 2 positions shown · non-contrast
Comparison: 01/12/2020

CLINICAL DATA: Pre-op clearance exam for bariatric surgery

EXAM:
CHEST - 2 VIEW

[chest pa]
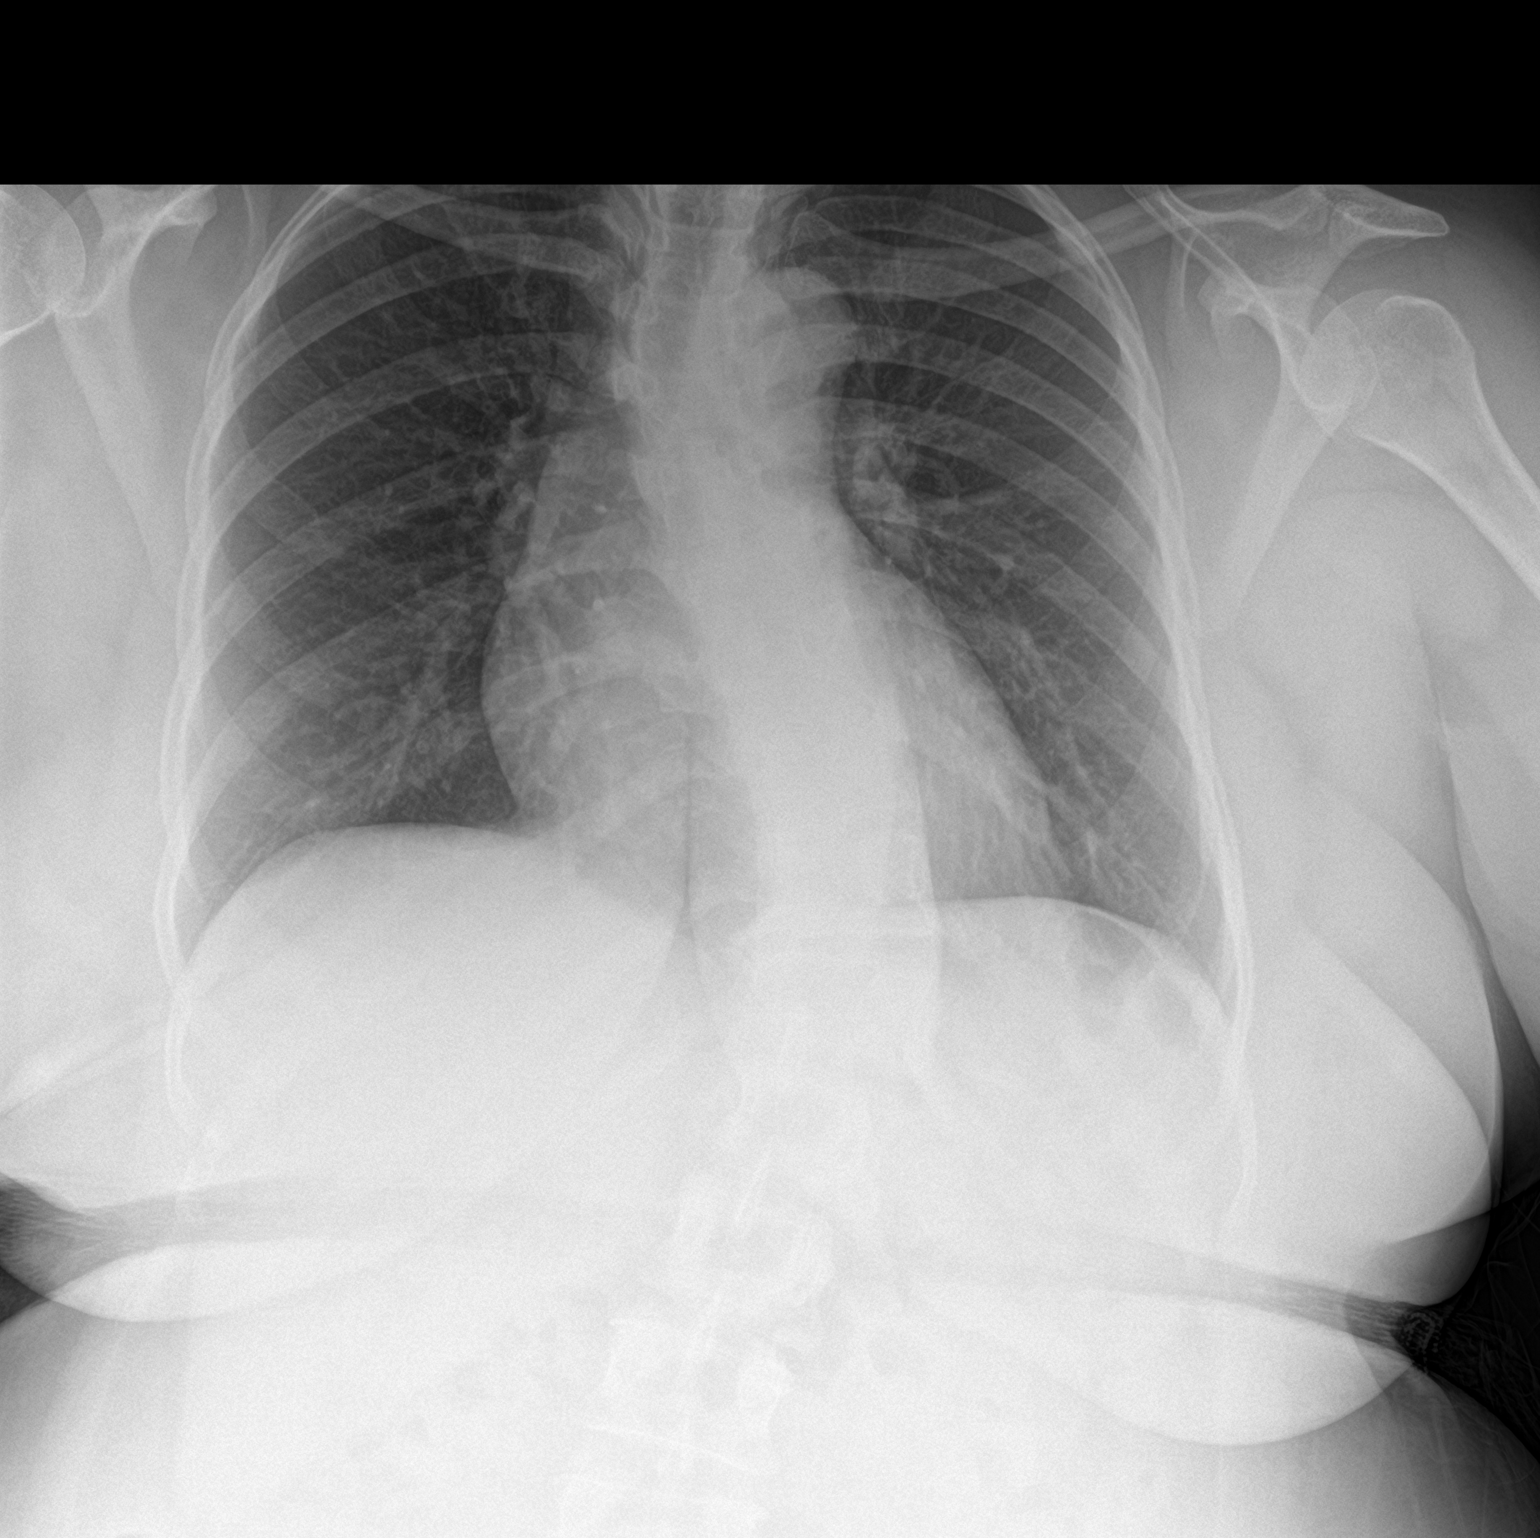

[chest lat]
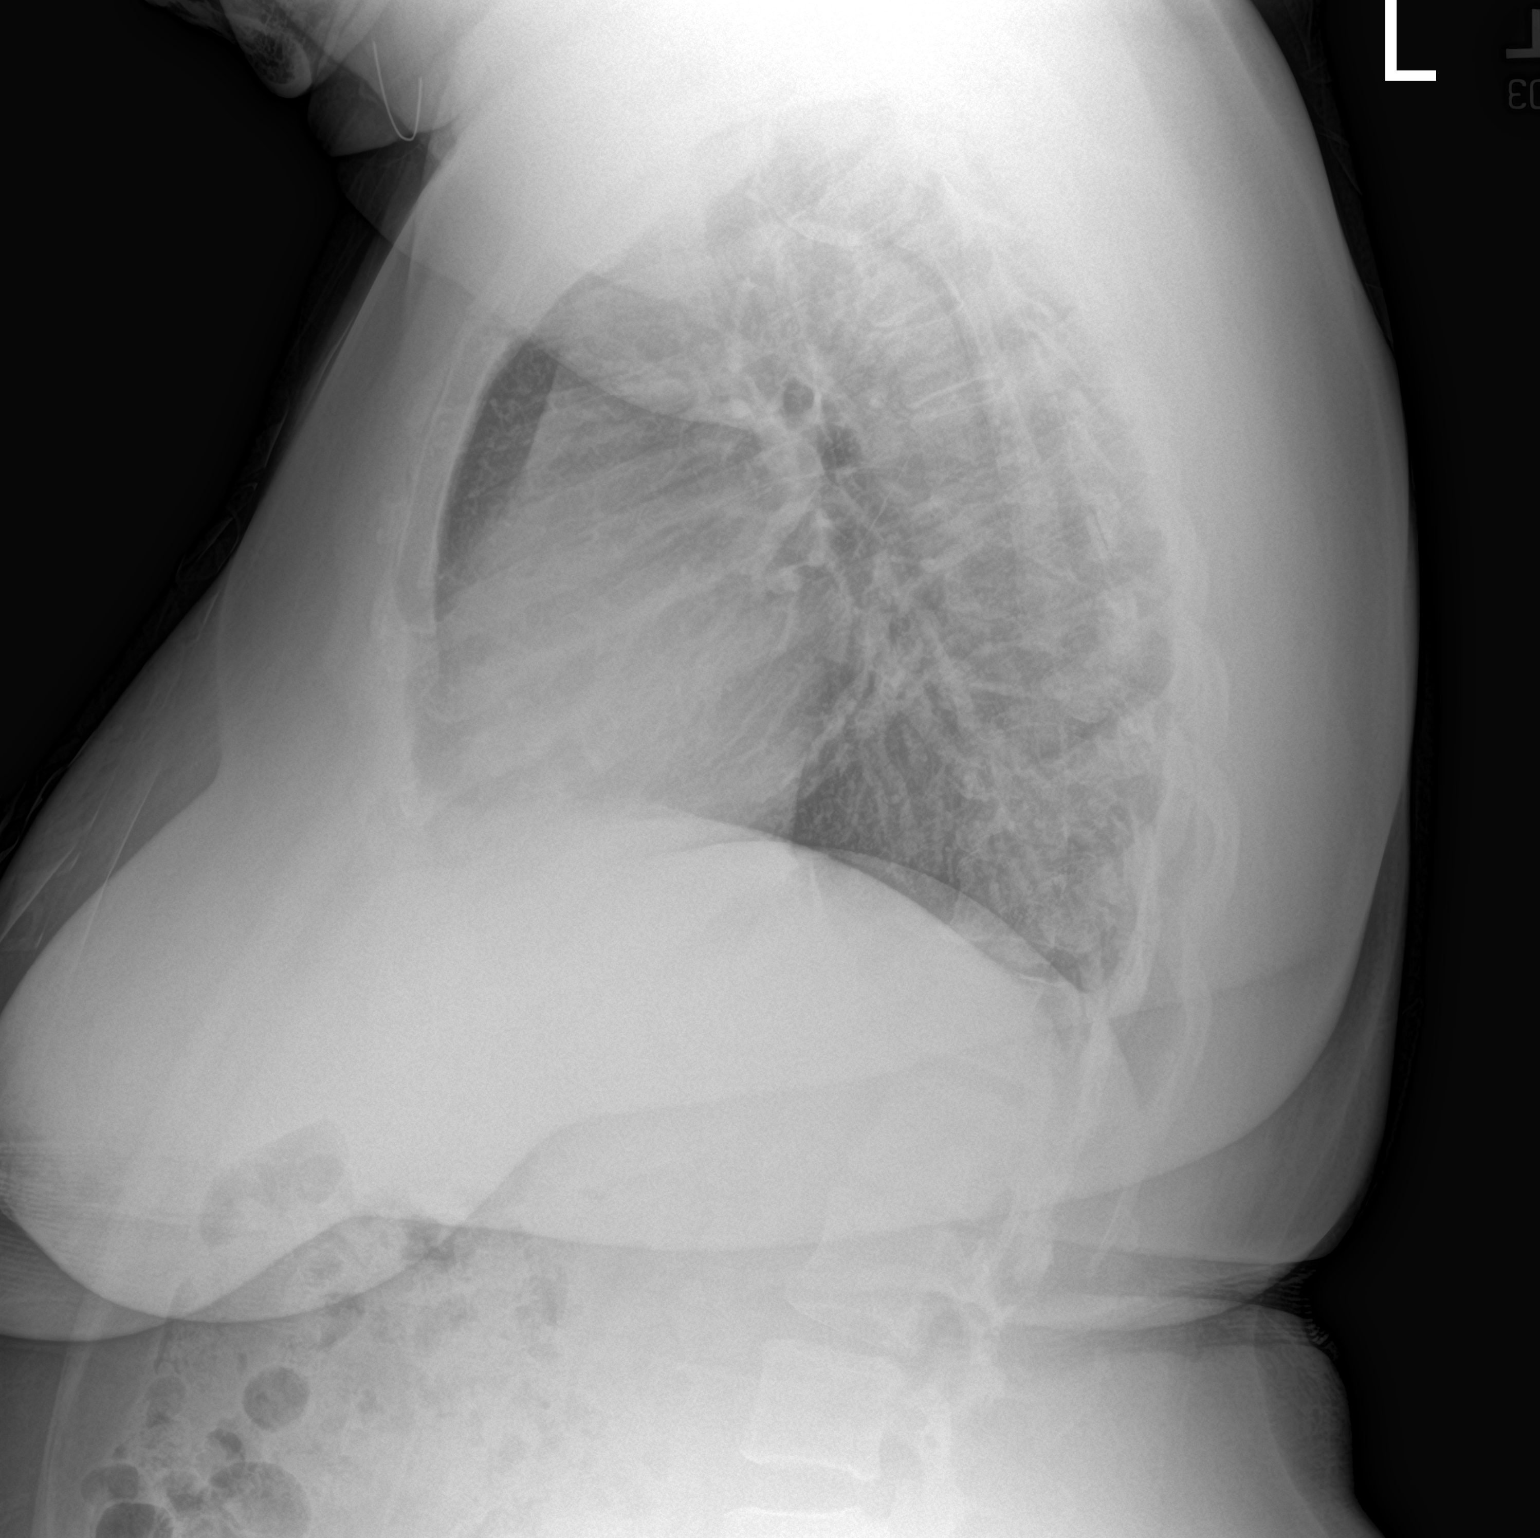

[2 of 2 positions shown; findings below may reference images not displayed]

FINDINGS: The heart size and mediastinal contours are within normal limits.
Both lungs are clear. Moderate thoracolumbar dextroscoliosis is
again demonstrated.
IMPRESSION: No active cardiopulmonary disease. Scoliosis.

## 2022-12-02 ENCOUNTER — Other Ambulatory Visit (HOSPITAL_COMMUNITY): Payer: Self-pay

## 2022-12-08 ENCOUNTER — Ambulatory Visit (INDEPENDENT_AMBULATORY_CARE_PROVIDER_SITE_OTHER): Payer: Self-pay | Admitting: Nurse Practitioner

## 2022-12-08 DIAGNOSIS — Z91199 Patient's noncompliance with other medical treatment and regimen due to unspecified reason: Secondary | ICD-10-CM

## 2022-12-08 NOTE — Progress Notes (Signed)
No show

## 2023-01-06 ENCOUNTER — Encounter (HOSPITAL_COMMUNITY): Payer: Self-pay

## 2023-01-06 ENCOUNTER — Other Ambulatory Visit (HOSPITAL_COMMUNITY): Payer: Self-pay

## 2023-01-06 MED ORDER — WEGOVY 0.25 MG/0.5ML ~~LOC~~ SOAJ
0.2500 mg | SUBCUTANEOUS | 0 refills | Status: DC
  Filled 2023-01-06 – 2023-04-23 (×4): qty 2, 28d supply, fill #0

## 2023-01-20 ENCOUNTER — Encounter (INDEPENDENT_AMBULATORY_CARE_PROVIDER_SITE_OTHER): Payer: Self-pay | Admitting: Family Medicine

## 2023-02-26 ENCOUNTER — Encounter (INDEPENDENT_AMBULATORY_CARE_PROVIDER_SITE_OTHER): Payer: Self-pay | Admitting: Family Medicine

## 2023-02-26 DIAGNOSIS — Z0289 Encounter for other administrative examinations: Secondary | ICD-10-CM

## 2023-03-03 ENCOUNTER — Ambulatory Visit: Payer: Commercial Managed Care - HMO | Admitting: Skilled Nursing Facility1

## 2023-03-24 ENCOUNTER — Encounter (INDEPENDENT_AMBULATORY_CARE_PROVIDER_SITE_OTHER): Payer: Self-pay | Admitting: Physician Assistant

## 2023-04-22 ENCOUNTER — Other Ambulatory Visit (HOSPITAL_COMMUNITY)
Admission: RE | Admit: 2023-04-22 | Discharge: 2023-04-22 | Disposition: A | Discharge status: Home / Self Care | Source: Ambulatory Visit | Attending: Infectious Diseases | Admitting: Infectious Diseases

## 2023-04-22 ENCOUNTER — Ambulatory Visit: Payer: Self-pay | Admitting: Infectious Diseases

## 2023-04-22 VITALS — BP 163/112 | HR 70 | Temp 98.4°F | Ht 62.0 in | Wt 241.2 lb

## 2023-04-22 DIAGNOSIS — Z113 Encounter for screening for infections with a predominantly sexual mode of transmission: Secondary | ICD-10-CM

## 2023-04-22 DIAGNOSIS — Z6841 Body Mass Index (BMI) 40.0 and over, adult: Secondary | ICD-10-CM

## 2023-04-22 DIAGNOSIS — I10 Essential (primary) hypertension: Secondary | ICD-10-CM

## 2023-04-22 DIAGNOSIS — Z23 Encounter for immunization: Secondary | ICD-10-CM | POA: Diagnosis not present

## 2023-04-22 DIAGNOSIS — B20 Human immunodeficiency virus [HIV] disease: Secondary | ICD-10-CM

## 2023-04-22 DIAGNOSIS — Z79899 Other long term (current) drug therapy: Secondary | ICD-10-CM

## 2023-04-22 MED ORDER — LISINOPRIL 20 MG PO TABS: 20.0000 mg | ORAL_TABLET | Freq: Every evening | ORAL | 3 refills | Status: DC

## 2023-04-22 MED ORDER — BIKTARVY 50-200-25 MG PO TABS: 1.0000 | ORAL_TABLET | Freq: Every day | ORAL | 3 refills | Status: DC

## 2023-04-22 NOTE — Progress Notes (Addendum)
 Subjective:    Patient ID: Courtney Chavez, female  DOB: [birth year], 48 y.o.        MRN: [id number]   HPI 48 yo F from [place] came [place]  2001. 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 Was changed to complera then to triumeq  due to wt gain. Has 3 sons (02-2004, 07-2005- studyig cyber at [place], 02-10-10- Art gallery manager) and 1 daughter (July 2020).They are doing well.  She was on Isentress - truvada  while pregnant, then back to triumeq  after delivering. Now on biktarvy .     BP is slightly up today, states she was taken off her anti-htn as her BP has been improving since having her surgery.      She had up and lower endoscopy 03-2021. +H pylori and adenomatous (benign) polyp in colon. She was tx for H pylori.    She has been seen at wt loss clinic. She had sleeve gastrectomy loss 09-24-2021 and has lost 50#.  Has f/u with her surgeon and asked about ozempeic ---> monjaro.  She has been working on diet (intermittent fasting) and exercise > 1 year. Her nutritionist rec against fasting.  Will send in rx for wegovy   She is committed to continued lifestyle changes   Has been so busy I forgot about myself.  She called for mammogram and was sent to f/u . Has been taking her biktarvy . Ran out of lisinopril .  My periods stopped and I don't like it.   HIV 1 RNA Quant (Copies/mL)  Date Value  04/19/2021 Not Detected  06/28/2020 49 (H)  09/20/2019 <20   HIV-1 RNA Viral Load (copies/mL)  Date Value  12/03/2021 <20   CD4 T Cell Abs (/uL)  Date Value  12/03/2021 692  06/28/2020 889  09/20/2019 687     Health Maintenance  Topic Date Due   INFLUENZA VACCINE  09/18/2022   COVID-19 Vaccine (4 - 2024-25 season) 10/19/2022   Cervical Cancer Screening (HPV/Pap Cotest)  05/14/2026   DTaP/Tdap/Td (2 - Td or Tdap) 06/01/2028   Colonoscopy  04/04/2031   Pneumococcal Vaccine 94-72 Years old (4 of 4 - PPSV23 or PCV20) 04/10/2040   Hepatitis C Screening  Completed   HIV Screening  Completed    HPV VACCINES  Aged Out      Review of Systems  Constitutional:  Positive for weight loss (intentional). Negative for chills and fever.  Respiratory:  Negative for cough and shortness of breath.   Cardiovascular:  Negative for chest pain.  Gastrointestinal:  Negative for constipation and diarrhea.  Genitourinary:  Negative for dysuria.  Neurological:  Negative for headaches.    Please see HPI. All other systems reviewed and negative.     Objective:  Physical Exam Vitals reviewed.  Constitutional:      Appearance: Normal appearance. She is obese.  HENT:     Mouth/Throat:     Mouth: Mucous membranes are moist.     Pharynx: No oropharyngeal exudate.  Eyes:     Extraocular Movements: Extraocular movements intact.     Pupils: Pupils are equal, round, and reactive to light.  Cardiovascular:     Rate and Rhythm: Normal rate and regular rhythm.  Pulmonary:     Effort: Pulmonary effort is normal.     Breath sounds: Normal breath sounds.  Abdominal:     General: Bowel sounds are normal. There is no distension.     Palpations: Abdomen is soft.     Tenderness: There is no abdominal tenderness.  Musculoskeletal:  General: Normal range of motion.     Cervical back: Normal range of motion and neck supple.     Right lower leg: No edema.     Left lower leg: No edema.  Neurological:     General: No focal deficit present.     Mental Status: She is alert.  Psychiatric:        Mood and Affect: Mood normal.          Assessment & Plan:

## 2023-04-22 NOTE — Assessment & Plan Note (Addendum)
 Apprecaite surgical f/u.  She has rx for wegovy. Will ask for pharm help.  Watching diet. Has been walking.

## 2023-04-22 NOTE — Assessment & Plan Note (Addendum)
 Asx Repeat BP 163/112 She does not want to start her BP rx back again- no sfx.  Rx written and pt encouraged.  Defer to her choice.

## 2023-04-22 NOTE — Assessment & Plan Note (Signed)
 She appears to be doing well Needs flu, covid vax.  PCV 20: 2026.  Labs today Working hard to pay off her new house.  She will call GYN, Mammo to get her appts.  Rtc in 6 months.

## 2023-04-23 ENCOUNTER — Other Ambulatory Visit: Payer: Self-pay | Admitting: Infectious Diseases

## 2023-04-23 ENCOUNTER — Telehealth: Payer: Self-pay | Admitting: Infectious Diseases

## 2023-04-23 ENCOUNTER — Other Ambulatory Visit (HOSPITAL_COMMUNITY): Payer: Self-pay

## 2023-04-23 DIAGNOSIS — B20 Human immunodeficiency virus [HIV] disease: Secondary | ICD-10-CM

## 2023-04-23 DIAGNOSIS — Z6841 Body Mass Index (BMI) 40.0 and over, adult: Secondary | ICD-10-CM

## 2023-04-23 DIAGNOSIS — R7303 Prediabetes: Secondary | ICD-10-CM

## 2023-04-23 LAB — T-HELPER CELLS (CD4) COUNT (NOT AT ARMC)
CD4 % Helper T Cell: 38 % (ref 33–65)
CD4 T Cell Abs: 640 /uL (ref 400–1790)

## 2023-04-23 LAB — URINE CYTOLOGY ANCILLARY ONLY
Chlamydia: NEGATIVE
Comment: NEGATIVE
Comment: NORMAL
Neisseria Gonorrhea: NEGATIVE

## 2023-04-23 NOTE — Telephone Encounter (Signed)
 Patient is overdue for her yearly mammogram and the [facility] is unable to schedule as a new order is needed.  Last mammogram on file : MM 3D SCREEN BREAST BILATERAL (Accession [id number]) (Order [id number]) Imaging Date: 12/03/2021 Department: [facility] [place]. [facility] Ordering/Authorizing: Ginnie Smart, MD    Can a new order be placed and she will be called and scheduled?

## 2023-04-24 ENCOUNTER — Other Ambulatory Visit: Payer: Self-pay

## 2023-04-24 ENCOUNTER — Other Ambulatory Visit (HOSPITAL_COMMUNITY): Payer: Self-pay

## 2023-04-24 LAB — COMPREHENSIVE METABOLIC PANEL
ALT: 12 IU/L (ref 0–32)
AST: 15 IU/L (ref 0–40)
Albumin: 3.9 g/dL (ref 3.9–4.9)
Alkaline Phosphatase: 63 IU/L (ref 44–121)
BUN/Creatinine Ratio: 18 (ref 9–23)
BUN: 12 mg/dL (ref 6–24)
Bilirubin Total: 0.2 mg/dL (ref 0.0–1.2)
CO2: 24 mmol/L (ref 20–29)
Calcium: 9 mg/dL (ref 8.7–10.2)
Chloride: 104 mmol/L (ref 96–106)
Creatinine, Ser: 0.68 mg/dL (ref 0.57–1.00)
Globulin, Total: 2.9 g/dL (ref 1.5–4.5)
Glucose: 122 mg/dL — ABNORMAL HIGH (ref 70–99)
Potassium: 3.7 mmol/L (ref 3.5–5.2)
Sodium: 141 mmol/L (ref 134–144)
Total Protein: 6.8 g/dL (ref 6.0–8.5)
eGFR: 107 mL/min/{1.73_m2} (ref >59)

## 2023-04-24 LAB — HIV-1 RNA QUANT-NO REFLEX-BLD
HIV-1 RNA Viral Load Log: 2 {Log_copies}/mL
HIV-1 RNA Viral Load: 100 {copies}/mL

## 2023-04-24 LAB — CBC
Hematocrit: 36.1 % (ref 34.0–46.6)
Hemoglobin: 11.9 g/dL (ref 11.1–15.9)
MCH: 29.5 pg (ref 26.6–33.0)
MCHC: 33 g/dL (ref 31.5–35.7)
MCV: 89 fL (ref 79–97)
Platelets: 213 10*3/uL (ref 150–450)
RBC: 4.04 x10E6/uL (ref 3.77–5.28)
RDW: 13.4 % (ref 11.7–15.4)
WBC: 4.5 10*3/uL (ref 3.4–10.8)

## 2023-04-24 LAB — RPR: RPR Ser Ql: NONREACTIVE

## 2023-04-24 LAB — LIPID PANEL
Chol/HDL Ratio: 5.2 ratio — ABNORMAL HIGH (ref 0.0–4.4)
Cholesterol, Total: 229 mg/dL — ABNORMAL HIGH (ref 100–199)
HDL: 44 mg/dL (ref >39)
LDL Chol Calc (NIH): 171 mg/dL — ABNORMAL HIGH (ref 0–99)
Triglycerides: 78 mg/dL (ref 0–149)
VLDL Cholesterol Cal: 14 mg/dL (ref 5–40)

## 2023-05-01 ENCOUNTER — Ambulatory Visit
Admission: RE | Admit: 2023-05-01 | Discharge: 2023-05-01 | Disposition: A | Discharge status: Home / Self Care | Source: Ambulatory Visit | Attending: Infectious Diseases | Admitting: Infectious Diseases

## 2023-05-01 DIAGNOSIS — Z6841 Body Mass Index (BMI) 40.0 and over, adult: Secondary | ICD-10-CM

## 2023-05-01 DIAGNOSIS — B20 Human immunodeficiency virus [HIV] disease: Secondary | ICD-10-CM

## 2023-05-01 DIAGNOSIS — R7303 Prediabetes: Secondary | ICD-10-CM

## 2023-05-14 ENCOUNTER — Other Ambulatory Visit: Payer: Self-pay | Admitting: Infectious Diseases

## 2023-05-14 ENCOUNTER — Telehealth: Payer: Self-pay | Admitting: *Deleted

## 2023-05-14 ENCOUNTER — Telehealth: Payer: Self-pay

## 2023-05-14 DIAGNOSIS — Z6841 Body Mass Index (BMI) 40.0 and over, adult: Secondary | ICD-10-CM

## 2023-05-14 DIAGNOSIS — K219 Gastro-esophageal reflux disease without esophagitis: Secondary | ICD-10-CM

## 2023-05-14 MED ORDER — TIRZEPATIDE 2.5 MG/0.5ML ~~LOC~~ SOAJ: SUBCUTANEOUS | 3 refills | Status: AC

## 2023-05-14 NOTE — Telephone Encounter (Signed)
 Call from patient her insurance will not cover [facility] for her weight loss.  Insurance will cover [employer]. Pay was advised that a message would be sent to Dr. Ninetta Lights to change to [facility].

## 2023-05-14 NOTE — Telephone Encounter (Signed)
 Pharmacy Patient Advocate Encounter   Received notification from CoverMyMeds that prior authorization for Courtney Chavez is required/requested.   Insurance verification completed.   The patient is insured through [employer] .   PA required; PA submitted to above mentioned insurance via CoverMyMeds Key/confirmation #/EOC [id number]. Status is pending   Will possibly need dx of diabetes.

## 2023-05-15 NOTE — Telephone Encounter (Signed)
 Received a fax from the pharmacy stating the patients insurance prefers Trulicity.

## 2023-05-26 ENCOUNTER — Other Ambulatory Visit: Payer: Self-pay | Admitting: Infectious Diseases

## 2023-05-26 DIAGNOSIS — Z6841 Body Mass Index (BMI) 40.0 and over, adult: Secondary | ICD-10-CM

## 2023-05-26 MED ORDER — WEGOVY 0.25 MG/0.5ML ~~LOC~~ SOAJ: SUBCUTANEOUS | 3 refills | Status: AC

## 2023-05-26 NOTE — Telephone Encounter (Signed)
 Is stuck on same wt.  Has been exercising.  Watching her diet- has cut down on her po intake. Has cut back on carbs and does some intermittent fasting. Was told not to fast by her nutritionist.  Has been working on diet and exercise > 1 year Committed to lifestyle changes after rx.   Has been having reflux.  Sleeps on 3 pillows Food coming back through her nose.

## 2023-07-01 ENCOUNTER — Encounter: Payer: Self-pay | Admitting: Infectious Diseases

## 2023-12-23 ENCOUNTER — Other Ambulatory Visit (HOSPITAL_COMMUNITY): Payer: Self-pay

## 2023-12-23 ENCOUNTER — Telehealth: Payer: Self-pay | Admitting: *Deleted

## 2023-12-23 NOTE — Telephone Encounter (Signed)
 PAP: Application for BIKTARVY  has been submitted to [facility] (ADVANCING ACCESS), online.  Copy of application printed and will be scanned to patients chart once reviewed and discussed with patient.  Samples also provided:  Medication Samples have been provided to the patient.  Drug name: BIKTARVY        Strength: 50-200-25MG         Qty: #28  LOT: [id number]  Exp.Date: 11/2024  Dosing instructions: TAKE 1 DAILY  Courtney Chavez [place] 10:42 AM 12/23/2023

## 2023-12-23 NOTE — Telephone Encounter (Signed)
 Call from patient-currently does not have insurance.  Was unable to fill her prescription for the Biktarvy .  Has not had in over a month.  Patient was informed that I would have her get with the Pharmacy Assistant for the Clinics about getting assistance and samples of.  Patient would also like to have Dr. Sisto complete a form for her to get Novonordisk a weight loss medication for her.  Patient stated that uninsured patients qualify for. Was unable to get the Ozempic .  Patient will drop off forms tomorrow morning.

## 2023-12-24 NOTE — Telephone Encounter (Signed)
 Patient here this morning to pick up Biktarvy    4 bottles . Patient also dro[ped off forms for her Weight loss medication application.

## 2023-12-28 NOTE — Telephone Encounter (Signed)
   Medication will ship to patients home.

## 2023-12-30 ENCOUNTER — Other Ambulatory Visit (HOSPITAL_COMMUNITY): Payer: Self-pay

## 2023-12-30 ENCOUNTER — Telehealth: Payer: Self-pay | Admitting: *Deleted

## 2023-12-30 ENCOUNTER — Telehealth: Payer: Self-pay

## 2023-12-30 NOTE — Telephone Encounter (Signed)
 Patient says she is placed on waitlist to see Dr. Eben.   Says she would like to be called back in the meantime.

## 2023-12-30 NOTE — Telephone Encounter (Signed)
 Forwarded to [place]. Pharmacy Tech Copied from CRM [id number]. Topic: Clinical - Medical Advice >> Dec 30, 2023 10:26 AM Courtney Chavez wrote: Reason for CRM: Patient called in requesting to speak with nurse Lavern. Please contact patient at [phone].

## 2023-12-30 NOTE — Telephone Encounter (Signed)
 Patient dropped off novo nordisk paperwork at office for possible Ozempic /Wegovy  assistance.   Spoke with patient.   Informed patient that Novo Nordisk paperwork is for Ozempic  assistance and this medication is used for diabetic patients, not weight loss. Wegovy  currently has no assistance available. There is a coupon available online for uninsured patients however it is $499 for a month supply. Patient cannot afford this. Wegovy  has also been removed from her med list.   Says she is not eligible for medicaid due to citizenship and is working on getting her [employer]) back, but is having financial difficulties.  Patient is uninsured and is requesting weight loss meds. She would like to see Dr. Eben about this. Informed patient there are possible oral options but that would be to discuss with her provider.  Transferred patient to front desk to make an appt with Dr. Eben. Says she is due to be seen.

## 2024-01-04 ENCOUNTER — Ambulatory Visit: Admitting: Medical

## 2024-01-06 ENCOUNTER — Encounter: Payer: Self-pay | Admitting: Infectious Diseases

## 2024-02-04 ENCOUNTER — Other Ambulatory Visit: Payer: Self-pay

## 2024-02-15 ENCOUNTER — Other Ambulatory Visit: Payer: Self-pay

## 2024-02-15 ENCOUNTER — Other Ambulatory Visit (HOSPITAL_COMMUNITY)
Admission: RE | Admit: 2024-02-15 | Discharge: 2024-02-15 | Disposition: A | Discharge status: Home / Self Care | Payer: Self-pay | Source: Ambulatory Visit | Attending: Infectious Diseases | Admitting: Infectious Diseases

## 2024-02-15 DIAGNOSIS — Z113 Encounter for screening for infections with a predominantly sexual mode of transmission: Secondary | ICD-10-CM | POA: Insufficient documentation

## 2024-02-15 DIAGNOSIS — B20 Human immunodeficiency virus [HIV] disease: Secondary | ICD-10-CM

## 2024-02-15 DIAGNOSIS — Z79899 Other long term (current) drug therapy: Secondary | ICD-10-CM

## 2024-02-16 LAB — CBC
Hematocrit: 39.3 % (ref 34.0–46.6)
Hemoglobin: 12.9 g/dL (ref 11.1–15.9)
MCH: 29.3 pg (ref 26.6–33.0)
MCHC: 32.8 g/dL (ref 31.5–35.7)
MCV: 89 fL (ref 79–97)
Platelets: 186 x10E3/uL (ref 150–450)
RBC: 4.41 x10E6/uL (ref 3.77–5.28)
RDW: 12.6 % (ref 11.7–15.4)
WBC: 5.8 x10E3/uL (ref 3.4–10.8)

## 2024-02-16 LAB — LIPID PANEL
Chol/HDL Ratio: 4.9 ratio — ABNORMAL HIGH (ref 0.0–4.4)
Cholesterol, Total: 249 mg/dL — ABNORMAL HIGH (ref 100–199)
HDL: 51 mg/dL
LDL Chol Calc (NIH): 178 mg/dL — ABNORMAL HIGH (ref 0–99)
Triglycerides: 112 mg/dL (ref 0–149)
VLDL Cholesterol Cal: 20 mg/dL (ref 5–40)

## 2024-02-16 LAB — COMPREHENSIVE METABOLIC PANEL WITH GFR
ALT: 15 IU/L (ref 0–32)
AST: 16 IU/L (ref 0–40)
Albumin: 4.2 g/dL (ref 3.9–4.9)
Alkaline Phosphatase: 61 IU/L (ref 41–116)
BUN/Creatinine Ratio: 13 (ref 9–23)
BUN: 10 mg/dL (ref 6–24)
Bilirubin Total: 0.3 mg/dL (ref 0.0–1.2)
CO2: 26 mmol/L (ref 20–29)
Calcium: 9.1 mg/dL (ref 8.7–10.2)
Chloride: 103 mmol/L (ref 96–106)
Creatinine, Ser: 0.79 mg/dL (ref 0.57–1.00)
Globulin, Total: 3.1 g/dL (ref 1.5–4.5)
Glucose: 124 mg/dL — ABNORMAL HIGH (ref 70–99)
Potassium: 3.8 mmol/L (ref 3.5–5.2)
Sodium: 142 mmol/L (ref 134–144)
Total Protein: 7.3 g/dL (ref 6.0–8.5)
eGFR: 92 mL/min/1.73

## 2024-02-16 LAB — URINE CYTOLOGY ANCILLARY ONLY
Chlamydia: NEGATIVE
Comment: NEGATIVE
Comment: NORMAL
Neisseria Gonorrhea: NEGATIVE

## 2024-02-16 LAB — HIV-1 RNA QUANT-NO REFLEX-BLD: HIV-1 RNA Viral Load: <20 {copies}/mL

## 2024-02-16 LAB — T-HELPER CELL (CD4) - (RCID CLINIC ONLY)
CD4 % Helper T Cell: 41 % (ref 33–65)
CD4 T Cell Abs: 921 /uL (ref 400–1790)

## 2024-02-16 LAB — SYPHILIS: RPR W/REFLEX TO RPR TITER AND TREPONEMAL ANTIBODIES, TRADITIONAL SCREENING AND DIAGNOSIS ALGORITHM: RPR Ser Ql: NONREACTIVE

## 2024-02-23 ENCOUNTER — Ambulatory Visit (INDEPENDENT_AMBULATORY_CARE_PROVIDER_SITE_OTHER): Payer: Self-pay | Admitting: Infectious Diseases

## 2024-02-23 VITALS — BP 154/113 | HR 75 | Temp 98.3°F | Ht 62.0 in | Wt 256.2 lb

## 2024-02-23 DIAGNOSIS — Z6841 Body Mass Index (BMI) 40.0 and over, adult: Secondary | ICD-10-CM

## 2024-02-23 DIAGNOSIS — Z23 Encounter for immunization: Secondary | ICD-10-CM | POA: Diagnosis not present

## 2024-02-23 DIAGNOSIS — M545 Low back pain, unspecified: Secondary | ICD-10-CM

## 2024-02-23 DIAGNOSIS — B20 Human immunodeficiency virus [HIV] disease: Secondary | ICD-10-CM

## 2024-02-23 DIAGNOSIS — E785 Hyperlipidemia, unspecified: Secondary | ICD-10-CM

## 2024-02-23 DIAGNOSIS — Z113 Encounter for screening for infections with a predominantly sexual mode of transmission: Secondary | ICD-10-CM

## 2024-02-23 DIAGNOSIS — I10 Essential (primary) hypertension: Secondary | ICD-10-CM | POA: Diagnosis not present

## 2024-02-23 MED ORDER — DICLOFENAC SODIUM 1 % EX GEL: 1.0000 | Freq: Every day | CUTANEOUS | 1 refills | Status: DC | PRN

## 2024-02-23 MED ORDER — SEMAGLUTIDE(0.25 OR 0.5MG/DOS) 2 MG/3ML ~~LOC~~ SOPN: PEN_INJECTOR | SUBCUTANEOUS | 2 refills | Status: DC

## 2024-02-23 MED ORDER — LOSARTAN POTASSIUM 25 MG PO TABS: 25.0000 mg | ORAL_TABLET | Freq: Every day | ORAL | 11 refills | Status: DC

## 2024-02-23 MED ORDER — BIKTARVY 50-200-25 MG PO TABS: 1.0000 | ORAL_TABLET | Freq: Every day | ORAL | 3 refills | Status: DC

## 2024-02-23 NOTE — Assessment & Plan Note (Signed)
 Will start her on ARB, she is scared she will get angioedema from [facility] Does not want CCB due to risk of constipation.  Rtc in 2 months.

## 2024-02-23 NOTE — Assessment & Plan Note (Signed)
 Write her for ozempic  (approved by her insurance per pt) Will see her back in 2 months.

## 2024-02-23 NOTE — Assessment & Plan Note (Signed)
 Refill voltaren  gel

## 2024-02-23 NOTE — Progress Notes (Signed)
 "  Subjective:    Patient ID: Courtney Chavez, female  DOB: [birth year], 48 y.o.        MRN: [id number]   HPI 49 yo F from [place]  [place]. 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 Was changed to complera then to triumeq  due to wt gain. Has 3 sons (02-2004, 07-2005, 02-10-10). They are doing well.  She was on Isentress - truvada while pregnant. She was then changed back to triumeq  after delivering. Now on biktarvy .  She has 8 yo daughter (July 2020).   BP is slightly up today, states she was taken off her anti-htn as her BP has been improving since having her surgery.      She had up and lower endoscopy 03-2021. +H pylori and adenomatous (benign) polyp in colon.    She has been seen at wt loss clinic. 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  Now gets full quickly, doesn't get hungry as per previously. She does not have reflux. Has had constipation since surgery- drinking 3 16 oz bottles of water/day.  I'm just getting fat. Tries to exercise but always working. Tried to get on ozempeic/wegovy  but her insurance did not approve.  She has no hx of thyroid disorder.   Off anti-htn rx but has noted it has gone back up. Has tried to control by reducing sodium. Works full time and part time. Has gym membership.   Was off insurance and got biktarvy  samples. She is back on insurance.   HIV 1 RNA Quant (Copies/mL)  Date Value  04/19/2021 Not Detected  06/28/2020 49 (H)  09/20/2019 <20   HIV-1 RNA Viral Load (copies/mL)  Date Value  02/15/2024 <20  04/22/2023 100  12/03/2021 <20   CD4 T Cell Abs (/uL)  Date Value  02/15/2024 921  04/22/2023 640  12/03/2021 692     Health Maintenance  Topic Date Due   COVID-19 Vaccine (4 - 2025-26 season) 10/19/2023   Pneumococcal Vaccine (4 of 4 - PCV20 or PCV21) 04/10/2025   Mammogram  04/30/2025   Cervical Cancer Screening (HPV/Pap Cotest)  05/14/2026   DTaP/Tdap/Td (2 - Td or Tdap) 06/01/2028    Colonoscopy  04/04/2031   Influenza Vaccine  Completed   Hepatitis B Vaccines 19-59 Average Risk  Completed   Hepatitis C Screening  Completed   HIV Screening  Completed   HPV VACCINES  Aged Out   Meningococcal B Vaccine  Aged Out      Review of Systems  Constitutional:  Negative for fever and weight loss.  Respiratory:  Negative for shortness of breath.   Cardiovascular:  Negative for chest pain.  Gastrointestinal:  Positive for constipation. Negative for diarrhea.  Genitourinary:  Negative for dysuria.  Neurological:  Negative for headaches.    Please see HPI. All other systems reviewed and negative.     Objective:  Physical Exam Vitals reviewed.  Constitutional:      Appearance: Normal appearance. She is obese.  HENT:     Mouth/Throat:     Mouth: Mucous membranes are moist.     Pharynx: No oropharyngeal exudate.  Eyes:     Extraocular Movements: Extraocular movements intact.     Pupils: Pupils are equal, round, and reactive to light.  Cardiovascular:     Rate and Rhythm: Normal rate and regular rhythm.  Pulmonary:     Effort: Pulmonary effort is normal.     Breath sounds: Normal breath sounds.  Abdominal:  General: Bowel sounds are normal. There is no distension.     Palpations: Abdomen is soft.     Tenderness: There is no abdominal tenderness.  Musculoskeletal:        General: Normal range of motion.     Cervical back: Normal range of motion and neck supple.     Right lower leg: Edema present.     Left lower leg: Edema present.  Neurological:     General: No focal deficit present.     Mental Status: She is alert.  Psychiatric:        Mood and Affect: Mood normal.            Assessment & Plan:  "

## 2024-02-23 NOTE — Assessment & Plan Note (Signed)
 Lipid Panel     Component Value Date/Time   CHOL 249 (H) 02/15/2024 1412   TRIG 112 02/15/2024 1412   HDL 51 02/15/2024 1412   CHOLHDL 4.9 (H) 02/15/2024 1412   CHOLHDL 5.4 (H) 04/19/2021 1228   VLDL 17 02/25/2016 1111   LDLCALC 178 (H) 02/15/2024 1412   LDLCALC 157 (H) 04/19/2021 1228   LABVLDL 20 02/15/2024 1412   Will work on wt loss Consider statin

## 2024-02-23 NOTE — Assessment & Plan Note (Signed)
 Will refill he biktarvy  now that she has insurance Will be available as needed to give her samples Has gotten flu and covid vax PCV up todate Has gotten pap and mammo Will see her in 2 months

## 2024-02-24 ENCOUNTER — Telehealth: Payer: Self-pay | Admitting: *Deleted

## 2024-02-24 NOTE — Telephone Encounter (Signed)
 Copied from CRM [id number]. Topic: Clinical - Prescription Issue >> Feb 24, 2024  4:20 PM Chiquita SQUIBB wrote: Reason for CRM: Patient is calling in stating that her meds that were sent to [facility] yesterday [facility]. Please update the patient. [id number] [place], [place] - [address]

## 2024-02-25 ENCOUNTER — Other Ambulatory Visit: Payer: Self-pay | Admitting: Infectious Diseases

## 2024-02-25 DIAGNOSIS — I10 Essential (primary) hypertension: Secondary | ICD-10-CM

## 2024-02-25 DIAGNOSIS — Z6841 Body Mass Index (BMI) 40.0 and over, adult: Secondary | ICD-10-CM

## 2024-02-25 DIAGNOSIS — M545 Low back pain, unspecified: Secondary | ICD-10-CM

## 2024-02-25 MED ORDER — LOSARTAN POTASSIUM 25 MG PO TABS: 25.0000 mg | ORAL_TABLET | Freq: Every day | ORAL | 11 refills | Status: DC

## 2024-02-25 MED ORDER — SEMAGLUTIDE(0.25 OR 0.5MG/DOS) 2 MG/3ML ~~LOC~~ SOPN: PEN_INJECTOR | SUBCUTANEOUS | 2 refills | Status: DC

## 2024-02-25 MED ORDER — DICLOFENAC SODIUM 1 % EX GEL: 1.0000 | Freq: Every day | CUTANEOUS | 1 refills | Status: DC | PRN

## 2024-02-29 ENCOUNTER — Other Ambulatory Visit: Payer: Self-pay | Admitting: Infectious Diseases

## 2024-02-29 ENCOUNTER — Telehealth: Payer: Self-pay | Admitting: *Deleted

## 2024-02-29 ENCOUNTER — Other Ambulatory Visit: Payer: Self-pay | Admitting: *Deleted

## 2024-02-29 ENCOUNTER — Telehealth: Payer: Self-pay

## 2024-02-29 DIAGNOSIS — I10 Essential (primary) hypertension: Secondary | ICD-10-CM

## 2024-02-29 DIAGNOSIS — M545 Low back pain, unspecified: Secondary | ICD-10-CM

## 2024-02-29 DIAGNOSIS — B20 Human immunodeficiency virus [HIV] disease: Secondary | ICD-10-CM

## 2024-02-29 DIAGNOSIS — Z6841 Body Mass Index (BMI) 40.0 and over, adult: Secondary | ICD-10-CM

## 2024-02-29 MED ORDER — DICLOFENAC SODIUM 1 % EX GEL: 1.0000 | Freq: Every day | CUTANEOUS | 1 refills | Status: DC | PRN

## 2024-02-29 MED ORDER — SEMAGLUTIDE(0.25 OR 0.5MG/DOS) 2 MG/3ML ~~LOC~~ SOPN: PEN_INJECTOR | SUBCUTANEOUS | 2 refills | Status: AC

## 2024-02-29 MED ORDER — BIKTARVY 50-200-25 MG PO TABS: 1.0000 | ORAL_TABLET | Freq: Every day | ORAL | 3 refills | Status: AC

## 2024-02-29 MED ORDER — LOSARTAN POTASSIUM 25 MG PO TABS: 25.0000 mg | ORAL_TABLET | Freq: Every day | ORAL | 11 refills | Status: AC

## 2024-02-29 MED ORDER — DICLOFENAC SODIUM 1 % EX GEL: 1.0000 | Freq: Every day | CUTANEOUS | 1 refills | Status: AC | PRN

## 2024-02-29 MED ORDER — LOSARTAN POTASSIUM 25 MG PO TABS: 25.0000 mg | ORAL_TABLET | Freq: Every day | ORAL | 11 refills | Status: DC

## 2024-02-29 NOTE — Telephone Encounter (Signed)
 [facility]                          Copied from CRM [id number]. Topic: Clinical - Prescription Issue >> Feb 24, 2024  4:20 PM Chiquita SQUIBB wrote: Reason for CRM: Patient is calling in stating that her meds that were sent to [facility] yesterday [facility] to [facility]. Please update the patient. [id number] [place], [place] - [address] >> Feb 26, 2024  7:53 PM Mercer PEDLAR wrote: Patient called stating [facility] and [facility] informed her that they have not received any prescriptions for her. Patient is [facility] and to contact her if there are any issues.  >> Feb 25, 2024  3:07 PM DeAngela L wrote: Pt calling to check the status informed the patient has been corrected

## 2024-02-29 NOTE — Telephone Encounter (Signed)
 Copied from CRM [id number]. Topic: Clinical - Prescription Issue >> Feb 24, 2024  4:20 PM Chiquita SQUIBB wrote: Reason for CRM: Patient is calling in stating that her meds that were sent to [facility] yesterday [facility]. Please update the patient. [id number] [place], [place] - [address] >> Feb 26, 2024  7:53 PM Mercer PEDLAR wrote: Patient called stating that [facility] and they informed her that they have not received any prescriptions for her. Patient is [facility] and to contact her if there are any issues.  >> Feb 25, 2024  3:07 PM DeAngela L wrote: Pt calling to check the status informed the patient has been corrected

## 2024-02-29 NOTE — Telephone Encounter (Signed)
 APPT [facility] FOR :  Name: Courtney Chavez, Courtney Chavez MRN: [id number]  Date: 04/26/2024 Status: Sch  Time: 2:00 PM Length: 15  Visit Type: [facility] INFECTIOUS DISEASE [2216] Copay: $35.00  Provider: Eben Reyes BROCKS, MD      Name: Courtney Chavez, Courtney Chavez MRN: [id number]  Date: 04/05/2024 Status: Sch  Time: 2:30 PM Length: 15  Visit Type: LAB VISIT [8014] Copay: $0.00  Provider: [facility] LAB      Copied from CRM #[id number]. Topic: Appointments - Scheduling Inquiry for Clinic >> Feb 24, 2024  4:17 PM Chiquita SQUIBB wrote: Reason for CRM: Patient is calling in to schedule a two month follow up for infectious disease with Dr. Eben. Please assist the patient. >> Feb 26, 2024  2:48 PM Mercer PEDLAR wrote: Patient is still waiting for a callback to schedule this follow up. Please contact for scheduling.

## 2024-02-29 NOTE — Telephone Encounter (Signed)
 Call to Pharmacy -prescription has been received.  Need a PA done for insurance payment.  Call to patient message was left that the Pharmacy is awaiting a PA approval from her [employer]. Copied from CRM #[id number]. Topic: Clinical - Prescription Issue >> Feb 29, 2024 11:43 AM Courtney Chavez wrote: Reason for CRM: Patient states that Semaglutide ,0.25 or 0.5MG /DOS, 2 MG/3ML SOPN was supposed to be sent over [facility]  [id number] [place], [place] - [address]  However when the patient calls that pharmacy they state they do not have this medication for her and she needs to contact her PCP

## 2024-02-29 NOTE — Telephone Encounter (Signed)
 [facility] per patient request.          Copied from CRM [id number]. Topic: Clinical - Prescription Issue >> Feb 24, 2024  4:20 PM Courtney Chavez wrote: Reason for CRM: Patient is calling in stating that her meds that were sent to [facility] yesterday [facility]/06 [facility]. Please update the patient. [id number] [place], [place] - [address] >> Feb 26, 2024  7:53 PM Courtney Chavez wrote: Patient called stating [facility] and they informed her that they have not received any prescriptions for her. Patient is [facility] and to contact her if there are any issues.  >> Feb 25, 2024  3:07 PM Courtney Chavez wrote: Pt calling to check the status informed the patient has been corrected

## 2024-02-29 NOTE — Telephone Encounter (Signed)
 Prior Authorization for patient (Ozempic  (0.25 or 0.5 MG/DOSE) 2MG /3ML pen-injectors) came through on cover my meds was submitted with last office notes awaiting approval or denial.  KEY:[id number]

## 2024-03-03 NOTE — Telephone Encounter (Signed)
 Dear ELGIN HICKMAN: On 02/29/2024, we received an authorization request from your prescriber for coverage of  OZEMPIC  0.25-0.5 MG/DOSE PEN. Unfortunately, the request has been denied due to the  following reason: This medication is being requested for weight loss. Unfortunately, this request was not approved  because it is not included in your benefit plan coverage. Make sure to talk with your provider  about other care options.

## 2024-03-04 ENCOUNTER — Ambulatory Visit: Payer: Self-pay

## 2024-03-04 NOTE — Telephone Encounter (Signed)
 FYI Only or Action Required?: Action required by provider: clinical question for provider and update on patient condition. Pt with mild swelling and rash on upper lip since starting losartan . Please advise.  Patient was last seen in primary care on 02/23/2024 by Courtney Reyes BROCKS, MD.  Called Nurse Triage reporting Angioedema and Medication Reaction.  Symptoms began yesterday.  Interventions attempted: Prescription medications: Losartan .  Symptoms are: gradually worsening.  Triage Disposition: See Physician Within 24 Hours  Patient/caregiver understands and will follow disposition?: Unsure    New rx for losartan . Started yesterday. Now with mild swelling and bumpy rash on top of her upper lip. No SOB. No tongue or throat swelling. No difficulty swallowing. No fever. Mild headache.   Attempted to call CAL to notify of possible allergic reaction to medication, no answer at this time. Forwarding HP message to office for guidance on medication in the meantime. Pt foregoing taking medication for now. No appts in office today. Advised pt go to UC in the next 24 hours. Unsure if pt will go.  Advised UC or ED for worsening symptoms.     Message from [place] sent at 03/04/2024  3:26 PM EST  Summary: lip swelling after taking rx: losartan  (COZAAR ) 25 MG tablet   Reason for Triage: Lip swelling after taking the rx: losartan  (COZAAR ) 25 MG tablet         Reason for Disposition  Looks like a boil or infected sore  Answer Assessment - Initial Assessment Questions 1. ONSET: When did the swelling start? (e.g., minutes, hours, days)     Last night  2. SEVERITY: How swollen is it? (e.g., part of an upper or lower lip, both lips)     Mild  3. ITCHING: Is there any itching? If Yes, ask: How much?   (Scale 1-10; mild, moderate or severe)     No  4. PAIN: Is the swelling painful to touch? If Yes, ask: How painful is it?   (Scale 1-10; mild, moderate or severe)     No  5.  CAUSE: What do you think is causing the lip swelling? (e.g., certain food or medicine, recent cosmetic treatment)     New rx for losartan   6. RECURRENT SYMPTOM: Have you had lip swelling before? If Yes, ask: When was the last time? What happened that time?     No  7. OTHER SYMPTOMS: Do you have any other symptoms? (e.g., fever, toothache)     Mild headache  8. PREGNANCY: Is there any chance you are pregnant? When was your last menstrual period?     Denies  Protocols used: Lip Swelling-A-AH

## 2024-03-07 NOTE — Telephone Encounter (Signed)
 RTC to patient.  Informed her that she should stop the Losartan  .  If any breathing problems go to the ER.  If symptoms are getting better and nor resolved to make an appointment to be seen in the Clinics .  Needs to be seen by someone even if Dr. Eben is unavailable.

## 2024-03-07 NOTE — Telephone Encounter (Signed)
 RTC to patient message left that the Clinics were calling to follow up on her Losartan  allergy.

## 2024-03-08 ENCOUNTER — Encounter: Payer: Self-pay | Admitting: Infectious Diseases

## 2024-04-05 ENCOUNTER — Other Ambulatory Visit: Payer: Self-pay

## 2024-04-26 ENCOUNTER — Encounter: Payer: Self-pay | Admitting: Infectious Diseases
